# Patient Record
Sex: Male | Born: 1935 | Race: Black or African American | Hispanic: No | Marital: Married | Smoking: Former smoker
Health system: Southern US, Community
[De-identification: ages and names within clinical notes are randomized; demographics above are authoritative.]

## PROBLEM LIST (undated history)

## (undated) DIAGNOSIS — M1990 Unspecified osteoarthritis, unspecified site: Secondary | ICD-10-CM

## (undated) DIAGNOSIS — C61 Malignant neoplasm of prostate: Secondary | ICD-10-CM

## (undated) DIAGNOSIS — I10 Essential (primary) hypertension: Secondary | ICD-10-CM

## (undated) DIAGNOSIS — R7303 Prediabetes: Secondary | ICD-10-CM

## (undated) DIAGNOSIS — K219 Gastro-esophageal reflux disease without esophagitis: Secondary | ICD-10-CM

## (undated) DIAGNOSIS — Z87442 Personal history of urinary calculi: Secondary | ICD-10-CM

## (undated) DIAGNOSIS — T884XXA Failed or difficult intubation, initial encounter: Secondary | ICD-10-CM

## (undated) DIAGNOSIS — D563 Thalassemia minor: Secondary | ICD-10-CM

## (undated) DIAGNOSIS — D751 Secondary polycythemia: Secondary | ICD-10-CM

## (undated) HISTORY — PX: KNEE ARTHROSCOPY: SUR90

## (undated) HISTORY — DX: Secondary polycythemia: D75.1

## (undated) HISTORY — PX: COLONOSCOPY: SHX174

## (undated) HISTORY — DX: Essential (primary) hypertension: I10

---

## 2005-07-01 HISTORY — PX: BACK SURGERY: SHX140

## 2007-06-17 ENCOUNTER — Encounter: Admission: RE | Admit: 2007-06-17 | Discharge: 2007-06-17 | Payer: Self-pay | Admitting: Family Medicine

## 2008-02-22 ENCOUNTER — Encounter: Admission: RE | Admit: 2008-02-22 | Discharge: 2008-02-22 | Payer: Self-pay | Admitting: Family Medicine

## 2009-08-07 ENCOUNTER — Encounter: Admission: RE | Admit: 2009-08-07 | Discharge: 2009-08-07 | Payer: Self-pay | Admitting: Orthopedic Surgery

## 2010-04-18 ENCOUNTER — Encounter: Admission: RE | Admit: 2010-04-18 | Discharge: 2010-04-18 | Payer: Self-pay | Admitting: Family Medicine

## 2010-05-01 ENCOUNTER — Inpatient Hospital Stay (HOSPITAL_COMMUNITY): Admission: RE | Admit: 2010-05-01 | Discharge: 2010-05-02 | Payer: Self-pay | Admitting: Neurosurgery

## 2010-05-28 ENCOUNTER — Encounter: Admission: RE | Admit: 2010-05-28 | Discharge: 2010-05-28 | Payer: Self-pay | Admitting: Family Medicine

## 2010-09-12 LAB — SURGICAL PCR SCREEN: Staphylococcus aureus: NEGATIVE

## 2010-09-12 LAB — CBC
MCH: 26.6 pg (ref 26.0–34.0)
Platelets: 147 10*3/uL — ABNORMAL LOW (ref 150–400)
RBC: 6.72 MIL/uL — ABNORMAL HIGH (ref 4.22–5.81)
WBC: 3.7 10*3/uL — ABNORMAL LOW (ref 4.0–10.5)

## 2010-09-12 LAB — BASIC METABOLIC PANEL
CO2: 30 mEq/L (ref 19–32)
Calcium: 9.5 mg/dL (ref 8.4–10.5)
Creatinine, Ser: 1.24 mg/dL (ref 0.4–1.5)
GFR calc Af Amer: >60 mL/min (ref >60)

## 2010-10-15 ENCOUNTER — Other Ambulatory Visit: Payer: Self-pay | Admitting: Oncology

## 2010-10-15 ENCOUNTER — Encounter (HOSPITAL_BASED_OUTPATIENT_CLINIC_OR_DEPARTMENT_OTHER): Payer: Medicare Other | Admitting: Oncology

## 2010-10-15 DIAGNOSIS — D751 Secondary polycythemia: Secondary | ICD-10-CM

## 2010-10-15 DIAGNOSIS — D72819 Decreased white blood cell count, unspecified: Secondary | ICD-10-CM

## 2010-10-15 LAB — CBC WITH DIFFERENTIAL/PLATELET
BASO%: 0.2 % (ref 0.0–2.0)
EOS%: 1.2 % (ref 0.0–7.0)
LYMPH%: 44.7 % (ref 14.0–49.0)
MCH: 25.7 pg — ABNORMAL LOW (ref 27.2–33.4)
MCHC: 33.5 g/dL (ref 32.0–36.0)
MONO#: 0.5 10*3/uL (ref 0.1–0.9)
MONO%: 11 % (ref 0.0–14.0)
Platelets: 157 10*3/uL (ref 140–400)
RBC: 6.85 10*6/uL — ABNORMAL HIGH (ref 4.20–5.82)
WBC: 4.3 10*3/uL (ref 4.0–10.3)
nRBC: 0 % (ref 0–0)

## 2010-10-15 LAB — MORPHOLOGY: PLT EST: ADEQUATE

## 2010-10-16 LAB — COMPREHENSIVE METABOLIC PANEL
Albumin: 4.6 g/dL (ref 3.5–5.2)
BUN: 14 mg/dL (ref 6–23)
CO2: 27 mEq/L (ref 19–32)
Calcium: 9.6 mg/dL (ref 8.4–10.5)
Chloride: 105 mEq/L (ref 96–112)
Creatinine, Ser: 1.25 mg/dL (ref 0.40–1.50)
Glucose, Bld: 102 mg/dL — ABNORMAL HIGH (ref 70–99)

## 2010-10-16 LAB — IRON AND TIBC
Iron: 133 ug/dL (ref 42–165)
TIBC: 229 ug/dL (ref 215–435)
UIBC: 96 ug/dL

## 2010-10-16 LAB — FERRITIN: Ferritin: 610 ng/mL — ABNORMAL HIGH (ref 22–322)

## 2011-01-07 ENCOUNTER — Encounter (HOSPITAL_BASED_OUTPATIENT_CLINIC_OR_DEPARTMENT_OTHER): Payer: Medicare Other | Admitting: Oncology

## 2011-01-07 ENCOUNTER — Other Ambulatory Visit: Payer: Self-pay | Admitting: Oncology

## 2011-01-07 DIAGNOSIS — D751 Secondary polycythemia: Secondary | ICD-10-CM

## 2011-01-07 DIAGNOSIS — D72819 Decreased white blood cell count, unspecified: Secondary | ICD-10-CM

## 2011-01-07 LAB — CBC WITH DIFFERENTIAL/PLATELET
BASO%: 0.4 % (ref 0.0–2.0)
Basophils Absolute: 0 10*3/uL (ref 0.0–0.1)
EOS%: 0.6 % (ref 0.0–7.0)
HCT: 49 % (ref 38.4–49.9)
HGB: 16.3 g/dL (ref 13.0–17.1)
LYMPH%: 27.1 % (ref 14.0–49.0)
MCH: 26.5 pg — ABNORMAL LOW (ref 27.2–33.4)
MCHC: 33.3 g/dL (ref 32.0–36.0)
MONO#: 0.4 10*3/uL (ref 0.1–0.9)
NEUT%: 63.9 % (ref 39.0–75.0)
Platelets: 156 10*3/uL (ref 140–400)

## 2011-04-24 ENCOUNTER — Other Ambulatory Visit: Payer: Self-pay | Admitting: Oncology

## 2011-04-24 ENCOUNTER — Ambulatory Visit (HOSPITAL_BASED_OUTPATIENT_CLINIC_OR_DEPARTMENT_OTHER): Payer: Medicare Other | Admitting: Oncology

## 2011-04-24 DIAGNOSIS — I10 Essential (primary) hypertension: Secondary | ICD-10-CM

## 2011-04-24 DIAGNOSIS — D751 Secondary polycythemia: Secondary | ICD-10-CM

## 2011-04-24 DIAGNOSIS — D45 Polycythemia vera: Secondary | ICD-10-CM

## 2011-04-24 DIAGNOSIS — D72819 Decreased white blood cell count, unspecified: Secondary | ICD-10-CM

## 2011-04-24 LAB — CBC WITH DIFFERENTIAL/PLATELET
Basophils Absolute: 0 10*3/uL (ref 0.0–0.1)
EOS%: 0.3 % (ref 0.0–7.0)
Eosinophils Absolute: 0 10*3/uL (ref 0.0–0.5)
HGB: 17.3 g/dL — ABNORMAL HIGH (ref 13.0–17.1)
LYMPH%: 26.4 % (ref 14.0–49.0)
MCH: 25.9 pg — ABNORMAL LOW (ref 27.2–33.4)
MCV: 76.6 fL — ABNORMAL LOW (ref 79.3–98.0)
MONO%: 10.4 % (ref 0.0–14.0)
NEUT#: 2.4 10*3/uL (ref 1.5–6.5)
Platelets: 174 10*3/uL (ref 140–400)
RDW: 14.7 % — ABNORMAL HIGH (ref 11.0–14.6)

## 2011-04-24 LAB — COMPREHENSIVE METABOLIC PANEL
AST: 20 U/L (ref 0–37)
Alkaline Phosphatase: 67 U/L (ref 39–117)
BUN: 15 mg/dL (ref 6–23)
Creatinine, Ser: 1.09 mg/dL (ref 0.50–1.35)
Glucose, Bld: 115 mg/dL — ABNORMAL HIGH (ref 70–99)
Potassium: 3.9 mEq/L (ref 3.5–5.3)
Total Bilirubin: 1.6 mg/dL — ABNORMAL HIGH (ref 0.3–1.2)

## 2011-07-24 ENCOUNTER — Other Ambulatory Visit (HOSPITAL_BASED_OUTPATIENT_CLINIC_OR_DEPARTMENT_OTHER): Payer: Medicare Other | Admitting: Lab

## 2011-07-24 DIAGNOSIS — D751 Secondary polycythemia: Secondary | ICD-10-CM

## 2011-07-24 DIAGNOSIS — D72819 Decreased white blood cell count, unspecified: Secondary | ICD-10-CM | POA: Diagnosis not present

## 2011-07-24 LAB — CBC WITH DIFFERENTIAL/PLATELET
BASO%: 0.2 % (ref 0.0–2.0)
Basophils Absolute: 0 10*3/uL (ref 0.0–0.1)
Eosinophils Absolute: 0 10*3/uL (ref 0.0–0.5)
HCT: 54.2 % — ABNORMAL HIGH (ref 38.4–49.9)
HGB: 17.8 g/dL — ABNORMAL HIGH (ref 13.0–17.1)
MONO#: 0.4 10*3/uL (ref 0.1–0.9)
NEUT#: 2.5 10*3/uL (ref 1.5–6.5)
NEUT%: 57.3 % (ref 39.0–75.0)
WBC: 4.3 10*3/uL (ref 4.0–10.3)
lymph#: 1.4 10*3/uL (ref 0.9–3.3)

## 2011-10-10 DIAGNOSIS — N529 Male erectile dysfunction, unspecified: Secondary | ICD-10-CM | POA: Diagnosis not present

## 2011-10-10 DIAGNOSIS — I10 Essential (primary) hypertension: Secondary | ICD-10-CM | POA: Diagnosis not present

## 2011-10-10 DIAGNOSIS — E7800 Pure hypercholesterolemia, unspecified: Secondary | ICD-10-CM | POA: Diagnosis not present

## 2011-10-23 ENCOUNTER — Telehealth: Payer: Self-pay | Admitting: *Deleted

## 2011-10-23 ENCOUNTER — Other Ambulatory Visit (HOSPITAL_BASED_OUTPATIENT_CLINIC_OR_DEPARTMENT_OTHER): Payer: Medicare Other | Admitting: Lab

## 2011-10-23 DIAGNOSIS — D751 Secondary polycythemia: Secondary | ICD-10-CM | POA: Diagnosis not present

## 2011-10-23 DIAGNOSIS — D72819 Decreased white blood cell count, unspecified: Secondary | ICD-10-CM | POA: Diagnosis not present

## 2011-10-23 LAB — CBC WITH DIFFERENTIAL/PLATELET
Basophils Absolute: 0 10*3/uL (ref 0.0–0.1)
EOS%: 0.3 % (ref 0.0–7.0)
HCT: 52.2 % — ABNORMAL HIGH (ref 38.4–49.9)
HGB: 17.1 g/dL (ref 13.0–17.1)
MCH: 26.2 pg — ABNORMAL LOW (ref 27.2–33.4)
MCV: 80.1 fL (ref 79.3–98.0)
NEUT%: 59.9 % (ref 39.0–75.0)
Platelets: 152 10*3/uL (ref 140–400)
lymph#: 1.2 10*3/uL (ref 0.9–3.3)

## 2011-10-23 LAB — COMPREHENSIVE METABOLIC PANEL
Albumin: 4.5 g/dL (ref 3.5–5.2)
CO2: 28 mEq/L (ref 19–32)
Glucose, Bld: 116 mg/dL — ABNORMAL HIGH (ref 70–99)
Sodium: 140 mEq/L (ref 135–145)
Total Bilirubin: 1.6 mg/dL — ABNORMAL HIGH (ref 0.3–1.2)
Total Protein: 6.8 g/dL (ref 6.0–8.3)

## 2011-10-23 NOTE — Telephone Encounter (Signed)
Message copied by Wende Mott on Wed Oct 23, 2011  3:24 PM ------      Message from: HA, Raliegh Ip T      Created: Wed Oct 23, 2011 11:31 AM       Please call pt.  His Hgb is now normalized.  It was high due to diuretic use.  Continue observation.  No change.

## 2011-10-23 NOTE — Telephone Encounter (Signed)
Left VM requesting pt call back for lab results.

## 2011-10-24 ENCOUNTER — Telehealth: Payer: Self-pay | Admitting: *Deleted

## 2011-10-24 NOTE — Telephone Encounter (Signed)
Pt returned call about his lab results.  Informed him Hgb is now normal and per Dr. Gaylyn Rong was probably elevated due to diuretic use.  Continue observation and keep next appt in July as scheduled.  Pt verbalized understanding.

## 2012-01-10 ENCOUNTER — Telehealth: Payer: Self-pay | Admitting: *Deleted

## 2012-01-10 NOTE — Telephone Encounter (Signed)
patient called and requested to move the appointment to 01-27-2012

## 2012-01-22 ENCOUNTER — Ambulatory Visit: Payer: Medicare Other | Admitting: Oncology

## 2012-01-22 ENCOUNTER — Other Ambulatory Visit: Payer: Medicare Other | Admitting: Lab

## 2012-01-27 ENCOUNTER — Other Ambulatory Visit (HOSPITAL_BASED_OUTPATIENT_CLINIC_OR_DEPARTMENT_OTHER): Payer: Medicare Other | Admitting: Lab

## 2012-01-27 ENCOUNTER — Telehealth: Payer: Self-pay | Admitting: Oncology

## 2012-01-27 ENCOUNTER — Ambulatory Visit (HOSPITAL_BASED_OUTPATIENT_CLINIC_OR_DEPARTMENT_OTHER): Payer: Medicare Other | Admitting: Oncology

## 2012-01-27 ENCOUNTER — Encounter: Payer: Self-pay | Admitting: Oncology

## 2012-01-27 VITALS — BP 125/73 | HR 71 | Temp 98.0°F | Wt 190.4 lb

## 2012-01-27 DIAGNOSIS — D72819 Decreased white blood cell count, unspecified: Secondary | ICD-10-CM

## 2012-01-27 DIAGNOSIS — D751 Secondary polycythemia: Secondary | ICD-10-CM

## 2012-01-27 DIAGNOSIS — I10 Essential (primary) hypertension: Secondary | ICD-10-CM

## 2012-01-27 HISTORY — DX: Essential (primary) hypertension: I10

## 2012-01-27 LAB — COMPREHENSIVE METABOLIC PANEL
Albumin: 4.3 g/dL (ref 3.5–5.2)
CO2: 28 mEq/L (ref 19–32)
Glucose, Bld: 107 mg/dL — ABNORMAL HIGH (ref 70–99)
Potassium: 3.9 mEq/L (ref 3.5–5.3)
Sodium: 140 mEq/L (ref 135–145)
Total Protein: 6.6 g/dL (ref 6.0–8.3)

## 2012-01-27 LAB — CBC WITH DIFFERENTIAL/PLATELET
Eosinophils Absolute: 0 10*3/uL (ref 0.0–0.5)
MONO#: 0.5 10*3/uL (ref 0.1–0.9)
NEUT#: 1.5 10*3/uL (ref 1.5–6.5)
RBC: 6.68 10*6/uL — ABNORMAL HIGH (ref 4.20–5.82)
RDW: 15.1 % — ABNORMAL HIGH (ref 11.0–14.6)
WBC: 3.7 10*3/uL — ABNORMAL LOW (ref 4.0–10.3)
lymph#: 1.6 10*3/uL (ref 0.9–3.3)

## 2012-01-27 NOTE — Patient Instructions (Addendum)
1.  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  In the past, you were on diuretic for blood pressure which can increase your Hgb.  Now, you're off of diuretic; but your Hgb is still slightly elevated.  In the past, your JAK-2 mutation testing was negative making polycythemia vera (PV) less likely (but not impossible).    Your white blood cell is slightly decreasing.  Will keep a watch on this.  In the future, if the hemoglobin continues to increase, and your white blood cell continues to decrease, may consider a bone marrow biopsy to ensure that your bone marrow is normal. At this time a bone marrow biopsy may still be too premature. 2.  Followup: Lab tests in 3 months and return visit in about 6 months.

## 2012-01-27 NOTE — Telephone Encounter (Signed)
appts made and printed for pt aom °

## 2012-01-27 NOTE — Progress Notes (Signed)
[facility]  Telephone:(336) [phone] Fax:(336) [phone]   OFFICE PROGRESS NOTE   Cc:  Astrid Divine, MD  DIAGNOSIS: secondary polycythemia; unclear etiology; JAK-2 mutation negative.   CURRENT THERAPY:  Watchful observation.   INTERVAL HISTORY: Cory [name] 76 y.o. male returns for regular follow up by himself.  He reports feeling well.  He recently returned from a 12-day road trip to [place] and back with his wife.  He is planning to go to a family reunion in [place]. [place] next month.  He feels well.  He denied fatigue, anorexia, weight loss, erythromelalgia, skin rash.   Patient denies fever, anorexia, weight loss, fatigue, headache, visual changes, confusion, drenching night sweats, palpable lymph node swelling, mucositis, odynophagia, dysphagia, nausea vomiting, jaundice, chest pain, palpitation, shortness of breath, dyspnea on exertion, productive cough, gum bleeding, epistaxis, hematemesis, hemoptysis, abdominal pain, abdominal swelling, early satiety, melena, hematochezia, hematuria, skin rash, spontaneous bleeding, joint swelling, joint pain, heat or cold intolerance, bowel bladder incontinence, back pain, focal motor weakness, paresthesia, depression, suicidal or homocidal ideation, feeling hopelessness.   Past Medical History  Diagnosis Date  . Leukopenia 01/27/2012  . Polycythemia 01/27/2012  . Hypertension 01/27/2012    No past surgical history on file.  Current Outpatient Prescriptions  Medication Sig Dispense Refill  . aspirin 81 MG tablet Take 81 mg by mouth daily.      . Atorvastatin Calcium (LIPITOR PO) Take by mouth daily.      . Benazepril HCl (LOTENSIN PO) Take by mouth daily.      . Ezetimibe (ZETIA PO) Take by mouth daily.        ALLERGIES:   has no known allergies.  REVIEW OF SYSTEMS:  The rest of the 14-point review of system was negative.   Filed Vitals:   01/27/12 0935  BP: 125/73  Pulse: 71  Temp: 98 F (36.7 C)   Wt  Readings from Last 3 Encounters:  01/27/12 190 lb 6.4 oz (86.365 kg)   ECOG Performance status: 0  PHYSICAL EXAMINATION:   General:  well-nourished man, in no acute distress.  Eyes:  no scleral icterus.  ENT:  There were no oropharyngeal lesions.  Neck was without thyromegaly.  Lymphatics:  Negative cervical, supraclavicular or axillary adenopathy.  Respiratory: lungs were clear bilaterally without wheezing or crackles.  Cardiovascular:  Regular rate and rhythm, S1/S2, without murmur, rub or gallop.  There was no pedal edema.  GI:  abdomen was soft, flat, nontender, nondistended, without organomegaly.  Muscoloskeletal:  no spinal tenderness of palpation of vertebral spine.  Skin exam was without echymosis, petichae.  Neuro exam was nonfocal.  Patient was able to get on and off exam table without assistance.  Gait was normal.  Patient was alerted and oriented.  Attention was good.   Language was appropriate.  Mood was normal without depression.  Speech was not pressured.  Thought content was not tangential.    LABORATORY/RADIOLOGY DATA:  Lab Results  Component Value Date   WBC 3.7* 01/27/2012   HGB 17.4* 01/27/2012   HCT 53.4* 01/27/2012   PLT 142 01/27/2012   GLUCOSE 116* 10/23/2011   ALKPHOS 67 10/23/2011   ALT 30 10/23/2011   AST 23 10/23/2011   NA 140 10/23/2011   K 3.7 10/23/2011   CL 106 10/23/2011   CREATININE 0.99 10/23/2011   BUN 16 10/23/2011   CO2 28 10/23/2011    ASSESSMENT AND PLAN:   1.  Diagnosis:  Secondary polycythemia (elevated hemoglobin Hgb) from unclear  cause.  In the past, he was on diuretic for blood pressure which can increase Hgb.  Now, he is off of diuretic; but his Hgb is still slightly elevated.  In the past, hisJAK-2 mutation testing was negative making polycythemia vera (PV) less likely (but not impossible).    He also has slight leukopenia without neutropenia, which could be a normal variation.  I recommend to continue watchful observation.  In the future, if he  develops significantly worsened polycythemia and leukopenia, I may consider a bone marrow biopsy.  At this time a bone marrow biopsy may still be too premature.  Cory [name] expressed informed understanding and wished to proceed with stated plan.  2.  Followup: Lab tests in 3 months and return visit in about 6 months     The length of time of the face-to-face encounter was 10 minutes. More than 50% of time was spent counseling and coordination of care.

## 2012-03-08 NOTE — Progress Notes (Signed)
Not applicable.  Before EPIC gone live.  

## 2012-04-14 DIAGNOSIS — Z125 Encounter for screening for malignant neoplasm of prostate: Secondary | ICD-10-CM | POA: Diagnosis not present

## 2012-04-14 DIAGNOSIS — M1990 Unspecified osteoarthritis, unspecified site: Secondary | ICD-10-CM | POA: Diagnosis not present

## 2012-04-14 DIAGNOSIS — Z0000 Encounter for general adult medical examination without abnormal findings: Secondary | ICD-10-CM | POA: Diagnosis not present

## 2012-04-14 DIAGNOSIS — E7800 Pure hypercholesterolemia, unspecified: Secondary | ICD-10-CM | POA: Diagnosis not present

## 2012-04-14 DIAGNOSIS — D751 Secondary polycythemia: Secondary | ICD-10-CM | POA: Diagnosis not present

## 2012-04-14 DIAGNOSIS — Z23 Encounter for immunization: Secondary | ICD-10-CM | POA: Diagnosis not present

## 2012-04-14 DIAGNOSIS — G459 Transient cerebral ischemic attack, unspecified: Secondary | ICD-10-CM | POA: Diagnosis not present

## 2012-04-14 DIAGNOSIS — R972 Elevated prostate specific antigen [PSA]: Secondary | ICD-10-CM | POA: Diagnosis not present

## 2012-04-14 DIAGNOSIS — D696 Thrombocytopenia, unspecified: Secondary | ICD-10-CM | POA: Diagnosis not present

## 2012-04-28 ENCOUNTER — Telehealth: Payer: Self-pay | Admitting: *Deleted

## 2012-04-28 ENCOUNTER — Other Ambulatory Visit (HOSPITAL_BASED_OUTPATIENT_CLINIC_OR_DEPARTMENT_OTHER): Payer: Medicare Other | Admitting: Lab

## 2012-04-28 DIAGNOSIS — D751 Secondary polycythemia: Secondary | ICD-10-CM

## 2012-04-28 DIAGNOSIS — D45 Polycythemia vera: Secondary | ICD-10-CM | POA: Diagnosis not present

## 2012-04-28 DIAGNOSIS — D72819 Decreased white blood cell count, unspecified: Secondary | ICD-10-CM | POA: Diagnosis not present

## 2012-04-28 DIAGNOSIS — I10 Essential (primary) hypertension: Secondary | ICD-10-CM

## 2012-04-28 LAB — CBC WITH DIFFERENTIAL/PLATELET
BASO%: 0.5 % (ref 0.0–2.0)
EOS%: 0.6 % (ref 0.0–7.0)
HGB: 17.7 g/dL — ABNORMAL HIGH (ref 13.0–17.1)
MCH: 26.1 pg — ABNORMAL LOW (ref 27.2–33.4)
MCHC: 32.8 g/dL (ref 32.0–36.0)
RDW: 14.9 % — ABNORMAL HIGH (ref 11.0–14.6)
lymph#: 1.5 10*3/uL (ref 0.9–3.3)

## 2012-04-28 LAB — MORPHOLOGY
PLT EST: ADEQUATE
RBC Comments: NORMAL

## 2012-04-28 NOTE — Telephone Encounter (Signed)
Message copied by Wende Mott on Tue Apr 28, 2012 11:53 AM ------      Message from: Jethro Bolus T      Created: Tue Apr 28, 2012 10:48 AM       Please call pt.  His Hgb is still elevated.  Cause is still unclear.  (FYI:  His testing in the past for polycythemia vera was negative).  I recommend continue watchful observation for now.  He has f/u with Belenda Cruise in Jan 2014.  Thanks.

## 2012-04-28 NOTE — Telephone Encounter (Signed)
Called pt w/ lab results and Dr. Lodema Pilot message below. Instructed to keep appt w/ [place] in January as scheduled. He verbalized understanding.

## 2012-07-02 ENCOUNTER — Telehealth: Payer: Self-pay | Admitting: Oncology

## 2012-07-02 NOTE — Telephone Encounter (Signed)
pt called and needed to change appt.Marland KitchenMarland KitchenMarland KitchenAnn Maki

## 2012-07-28 ENCOUNTER — Other Ambulatory Visit: Payer: Medicare Other | Admitting: Lab

## 2012-07-28 ENCOUNTER — Ambulatory Visit: Payer: Medicare Other | Admitting: Oncology

## 2012-08-04 ENCOUNTER — Telehealth: Payer: Self-pay | Admitting: *Deleted

## 2012-08-04 ENCOUNTER — Encounter: Payer: Self-pay | Admitting: Oncology

## 2012-08-04 ENCOUNTER — Other Ambulatory Visit (HOSPITAL_BASED_OUTPATIENT_CLINIC_OR_DEPARTMENT_OTHER): Payer: Medicare Other | Admitting: Lab

## 2012-08-04 ENCOUNTER — Telehealth: Payer: Self-pay | Admitting: Oncology

## 2012-08-04 ENCOUNTER — Ambulatory Visit (HOSPITAL_BASED_OUTPATIENT_CLINIC_OR_DEPARTMENT_OTHER): Payer: Medicare Other | Admitting: Oncology

## 2012-08-04 VITALS — BP 128/89 | HR 73 | Temp 97.8°F | Resp 20 | Ht 68.0 in | Wt 193.8 lb

## 2012-08-04 DIAGNOSIS — D45 Polycythemia vera: Secondary | ICD-10-CM | POA: Diagnosis not present

## 2012-08-04 DIAGNOSIS — I10 Essential (primary) hypertension: Secondary | ICD-10-CM

## 2012-08-04 DIAGNOSIS — D72819 Decreased white blood cell count, unspecified: Secondary | ICD-10-CM

## 2012-08-04 DIAGNOSIS — D751 Secondary polycythemia: Secondary | ICD-10-CM

## 2012-08-04 LAB — COMPREHENSIVE METABOLIC PANEL (CC13)
ALT: 36 U/L (ref 0–55)
BUN: 17.3 mg/dL (ref 7.0–26.0)
CO2: 29 mEq/L (ref 22–29)
Calcium: 9.4 mg/dL (ref 8.4–10.4)
Chloride: 106 mEq/L (ref 98–107)
Creatinine: 1.2 mg/dL (ref 0.7–1.3)

## 2012-08-04 LAB — CBC WITH DIFFERENTIAL/PLATELET
BASO%: 0.4 % (ref 0.0–2.0)
EOS%: 0.7 % (ref 0.0–7.0)
LYMPH%: 33 % (ref 14.0–49.0)
MCHC: 32.8 g/dL (ref 32.0–36.0)
MCV: 79.2 fL — ABNORMAL LOW (ref 79.3–98.0)
MONO%: 11.2 % (ref 0.0–14.0)
Platelets: 146 10*3/uL (ref 140–400)
RBC: 7.05 10*6/uL — ABNORMAL HIGH (ref 4.20–5.82)

## 2012-08-04 LAB — MORPHOLOGY: RBC Comments: NORMAL

## 2012-08-04 NOTE — Telephone Encounter (Signed)
Per Dr. Gaylyn Rong I have moved the appts to later time. JMW

## 2012-08-04 NOTE — Telephone Encounter (Signed)
I have called and set up the bone marrow procedure with the wife. They are requesting a late time that day. Wife transferred to the desk RN. JMW

## 2012-08-04 NOTE — Telephone Encounter (Signed)
sent this email to Dr. Gaylyn Rong This pt called and is requesting that his bm bx be changed to either 2.12.14 after 10:00am or on his spring break of April 14th through the 18th.

## 2012-08-04 NOTE — Progress Notes (Signed)
[facility]  [phone] Fax:(336) [phone]   OFFICE PROGRESS NOTE   Cc:  Astrid Divine, MD  DIAGNOSIS: secondary polycythemia; unclear etiology; JAK-2 mutation negative.   CURRENT THERAPY:  Watchful observation.   INTERVAL HISTORY: Cory [name] 77 y.o. male returns for regular follow up by himself.  He reports feeling well. He is working part-time as a Surveyor, mining. He denied fatigue, anorexia, weight loss, erythromelalgia, skin rash. Denies back pain. No hematuria or other bleeding noted.  Patient denies fever, anorexia, weight loss, fatigue, headache, visual changes, confusion, drenching night sweats, palpable lymph node swelling, mucositis, odynophagia, dysphagia, nausea vomiting, jaundice, chest pain, palpitation, shortness of breath, dyspnea on exertion, productive cough, gum bleeding, epistaxis, hematemesis, hemoptysis, abdominal pain, abdominal swelling, early satiety, melena, hematochezia, hematuria, skin rash, spontaneous bleeding, joint swelling, joint pain, heat or cold intolerance, bowel bladder incontinence, back pain, focal motor weakness, paresthesia, depression, suicidal or homocidal ideation, feeling hopelessness.   Past Medical History  Diagnosis Date  . Leukopenia 01/27/2012  . Polycythemia 01/27/2012  . Hypertension 01/27/2012    History reviewed. No pertinent past surgical history.  Current Outpatient Prescriptions  Medication Sig Dispense Refill  . aspirin 81 MG tablet Take 81 mg by mouth daily.      . Atorvastatin Calcium (LIPITOR PO) Take by mouth daily.      . Benazepril HCl (LOTENSIN PO) Take by mouth daily.      . Ezetimibe (ZETIA PO) Take by mouth daily.        ALLERGIES:   has no known allergies.  REVIEW OF SYSTEMS:  The rest of the 14-point review of system was negative.   Filed Vitals:   08/04/12 1042  BP: 128/89  Pulse: 73  Temp: 97.8 F (36.6 C)  Resp: 20   Wt Readings from Last 3 Encounters:   08/04/12 193 lb 12.8 oz (87.907 kg)  01/27/12 190 lb 6.4 oz (86.365 kg)   ECOG Performance status: 0  PHYSICAL EXAMINATION:   General:  well-nourished man, in no acute distress.  Eyes:  no scleral icterus.  ENT:  There were no oropharyngeal lesions.  Neck was without thyromegaly.  Lymphatics:  Negative cervical, supraclavicular or axillary adenopathy.  Respiratory: lungs were clear bilaterally without wheezing or crackles.  Cardiovascular:  Regular rate and rhythm, S1/S2, without murmur, rub or gallop.  There was no pedal edema.  GI:  abdomen was soft, flat, nontender, nondistended, without organomegaly.  Muscoloskeletal:  no spinal tenderness of palpation of vertebral spine.  Skin exam was without echymosis, petichae.  Neuro exam was nonfocal.  Patient was able to get on and off exam table without assistance.  Gait was normal.  Patient was alerted and oriented.  Attention was good.   Language was appropriate.  Mood was normal without depression.  Speech was not pressured.  Thought content was not tangential.    LABORATORY/RADIOLOGY DATA:  Lab Results  Component Value Date   WBC 4.4 08/04/2012   HGB 18.3* 08/04/2012   HCT 55.8* 08/04/2012   PLT 146 08/04/2012   GLUCOSE 108* 08/04/2012   ALKPHOS 83 08/04/2012   ALT 36 08/04/2012   AST 19 08/04/2012   NA 145 08/04/2012   K 3.9 08/04/2012   CL 106 08/04/2012   CREATININE 1.2 08/04/2012   BUN 17.3 08/04/2012   CO2 29 08/04/2012    ASSESSMENT AND PLAN:   1.  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  In the past, he was  on diuretic for blood pressure which can increase Hgb.  Now, he is off of diuretic; but his Hgb is still slightly elevated and up slightly from his last lab. In the past, hisJAK-2 mutation testing was negative making polycythemia vera (PV) less likely (but not impossible).  Discussed proceeding with a bone marrow biopsy. The bone marrow biopsy procedure was discussed with him in detail. He is in agreement with  proceeding with the bone marrow biopsy.  I have recommended that he continue ASA.  2.  Followup: Will be scheduled after his bone marrow biopsy.     The length of time of the face-to-face encounter was 15 minutes. More than 50% of time was spent counseling and coordination of care.

## 2012-08-12 ENCOUNTER — Other Ambulatory Visit: Payer: Self-pay | Admitting: Oncology

## 2012-08-12 ENCOUNTER — Other Ambulatory Visit (HOSPITAL_COMMUNITY)
Admission: RE | Admit: 2012-08-12 | Discharge: 2012-08-12 | Disposition: A | Discharge status: Home / Self Care | Payer: Medicare Other | Source: Ambulatory Visit | Attending: Oncology | Admitting: Oncology

## 2012-08-12 ENCOUNTER — Ambulatory Visit (HOSPITAL_BASED_OUTPATIENT_CLINIC_OR_DEPARTMENT_OTHER): Payer: Medicare Other | Admitting: Oncology

## 2012-08-12 ENCOUNTER — Telehealth: Payer: Self-pay | Admitting: Oncology

## 2012-08-12 ENCOUNTER — Other Ambulatory Visit: Payer: Medicare Other

## 2012-08-12 VITALS — BP 152/88 | HR 70 | Temp 98.1°F

## 2012-08-12 DIAGNOSIS — D72819 Decreased white blood cell count, unspecified: Secondary | ICD-10-CM

## 2012-08-12 DIAGNOSIS — D751 Secondary polycythemia: Secondary | ICD-10-CM | POA: Diagnosis not present

## 2012-08-12 DIAGNOSIS — D45 Polycythemia vera: Secondary | ICD-10-CM | POA: Insufficient documentation

## 2012-08-12 LAB — BONE MARROW EXAM: Bone Marrow Exam: 114

## 2012-08-12 NOTE — Patient Instructions (Addendum)
Bone Marrow Aspiration, Bone Marrow Biopsy  Care After  Read the instructions outlined below and refer to this sheet in the next few weeks. These discharge instructions provide you with general information on caring for yourself after you leave the hospital. Your caregiver may also give you specific instructions. While your treatment has been planned according to the most current medical practices available, unavoidable complications occasionally occur. If you have any problems or questions after discharge, call your caregiver.  FINDING OUT THE RESULTS OF YOUR TEST  Not all test results are available during your visit. If your test results are not back during the visit, make an appointment with your caregiver to find out the results. Do not assume everything is normal if you have not heard from your caregiver or the medical facility. It is important for you to follow up on all of your test results.   HOME CARE INSTRUCTIONS   You have had sedation and may be sleepy or dizzy. Your thinking may not be as clear as usual. For the next 24 hours:   Only take over-the-counter or prescription medicines for pain, discomfort, and or fever as directed by your caregiver.   Do not drink alcohol.   Do not smoke.   Do not drive.   Do not make important legal decisions.   Do not operate heavy machinery.   Do not care for small children by yourself.   Keep your dressing clean and dry. You may replace dressing with a bandage after 24 hours.   You may take a bath or shower after 24 hours.   Use an ice pack for 20 minutes every 2 hours while awake for pain as needed.  SEEK MEDICAL CARE IF:    There is redness, swelling, or increasing pain at the biopsy site.   There is pus coming from the biopsy site.   There is drainage from a biopsy site lasting longer than one day.   An unexplained oral temperature above 102 F (38.9 C) develops.  SEEK IMMEDIATE MEDICAL CARE IF:    You develop a rash.   You have difficulty  breathing.   You develop any reaction or side effects to medications given.  Document Released: 01/04/2005 Document Revised: 09/09/2011 Document Reviewed: 06/14/2008  ExitCare Patient Information 2013 [employer].

## 2012-08-12 NOTE — Procedures (Signed)
   [facility]  Telephone:(336) [phone] Fax:(336) [phone]   BONE MARROW BIOPSY AND ASPIRATION   INDICATION:  Persistent JAK-2 negative polycythemia  Procedure: After obtained from consent, Cory [name] was placed in the prone position. Time out was performed verifying correct patient and procedure. The skin overlying the left posterior crest was prepped with Betadine and draped in the usual sterile fashion. The skin and periosteum were infiltrated with 20 mL of 2% lidocaine. A small puncture wound was made with #11 scalpel blade.  Bone marrow aspirate was obtained on the first pass of the aspiration needle.  One core biopsy was obtained through the same incision.   The aspirate was sent for routine histology, flow cytometry, and cytogenetics.  Core biopsy was sent for routine histology.   Cory [name] tolerated procedure well with minimal  blood loss and without immediate complication.   A sterile dressing was applied.   Jethro Bolus M.D. 08/12/2012

## 2012-08-12 NOTE — Progress Notes (Signed)
Please see bone marrow biopsy dated 08/12/2012.

## 2012-08-12 NOTE — Progress Notes (Signed)
1140-Pt discharged to home s/p BM biopsy to left hip per Dr. Gaylyn Rong.  VS stable.  Dressing clean, dry and intact to left hip at time of discharge. BM biopsy discharge instructions given to patient and pt.'s wife per Dr. Gaylyn Rong and reviewed at time of discharge.  Teach back done.  Pt and pt.'s wife with no questions at this time.

## 2012-08-12 NOTE — Telephone Encounter (Signed)
gv and printed appt schedule for pt for March °

## 2012-08-13 LAB — CBC
HCT: 56.4 % — ABNORMAL HIGH (ref 39.0–52.0)
MCH: 26.1 pg (ref 26.0–34.0)
MCHC: 30.7 g/dL (ref 30.0–36.0)
RDW: 16.4 % — ABNORMAL HIGH (ref 11.5–15.5)

## 2012-08-13 LAB — DIFFERENTIAL
Basophils Absolute: 0 10*3/uL (ref 0.0–0.1)
Basophils Relative: 0 % (ref 0–1)
Eosinophils Absolute: 0 10*3/uL (ref 0.0–0.7)
Eosinophils Relative: 1 % (ref 0–5)
Monocytes Absolute: 0.4 10*3/uL (ref 0.1–1.0)
Neutro Abs: 1.6 10*3/uL — ABNORMAL LOW (ref 1.7–7.7)

## 2012-09-01 NOTE — Patient Instructions (Addendum)
1. Issue:  Polycythemia (elevated red blood cell) 2.  Bone marrow biopsy in 08/2012 did not show leukemia, lymphoma.  3.  I have high suspicion for JAK-2 negative polycythemia vera (a condition where the bone marrow spontaneously produces red blood cell without feedback signal).  Untreated, it can result in elevated risk of blood clot. 4.  Recommendations:   *  Start phlebotomy monthly to achieve hematocrit to <45%>  *   Once hematocrit is <45%, will start Hydrea to decrease risk of blood clot.    *  Start Asprin 81 mg by mouth, once daily now to decrease risk of blood clot.

## 2012-09-02 ENCOUNTER — Ambulatory Visit (HOSPITAL_BASED_OUTPATIENT_CLINIC_OR_DEPARTMENT_OTHER): Payer: Medicare Other | Admitting: Oncology

## 2012-09-02 ENCOUNTER — Ambulatory Visit (HOSPITAL_BASED_OUTPATIENT_CLINIC_OR_DEPARTMENT_OTHER): Payer: Medicare Other | Admitting: Lab

## 2012-09-02 VITALS — BP 142/89 | HR 87 | Temp 98.5°F | Resp 20 | Ht 68.0 in | Wt 192.8 lb

## 2012-09-02 DIAGNOSIS — D751 Secondary polycythemia: Secondary | ICD-10-CM

## 2012-09-02 DIAGNOSIS — D45 Polycythemia vera: Secondary | ICD-10-CM

## 2012-09-02 LAB — CBC WITH DIFFERENTIAL/PLATELET
BASO%: 0.6 % (ref 0.0–2.0)
EOS%: 0.6 % (ref 0.0–7.0)
HGB: 17.9 g/dL — ABNORMAL HIGH (ref 13.0–17.1)
MCH: 25.9 pg — ABNORMAL LOW (ref 27.2–33.4)
MCHC: 32.9 g/dL (ref 32.0–36.0)
MCV: 78.9 fL — ABNORMAL LOW (ref 79.3–98.0)
MONO%: 12.1 % (ref 0.0–14.0)
RBC: 6.89 10*6/uL — ABNORMAL HIGH (ref 4.20–5.82)
RDW: 15 % — ABNORMAL HIGH (ref 11.0–14.6)
lymph#: 1.5 10*3/uL (ref 0.9–3.3)

## 2012-09-02 NOTE — Progress Notes (Signed)
[facility]  Telephone:(336) [phone] Fax:(336) [phone]   OFFICE PROGRESS NOTE   Cc:  Astrid Divine, MD  DIAGNOSIS: JAK-2 mutation negative polycythemia; cannot rule out PV since extensive work up has been negative for secondary causes of polycythemia including normal bone marrow biopsy in 08/2012.   CURRENT THERAPY:  Watchful observation.   INTERVAL HISTORY: Cory [name] 77 y.o. male returns for regular follow up by himself to discuss result of work up.  He feels great.  He denied all symptoms.    Patient denied fever, anorexia, weight loss, fatigue, headache, visual changes, confusion, drenching night sweats, palpable lymph node swelling, mucositis, odynophagia, dysphagia, nausea vomiting, jaundice, chest pain, palpitation, shortness of breath, dyspnea on exertion, productive cough, gum bleeding, epistaxis, hematemesis, hemoptysis, abdominal pain, abdominal swelling, early satiety, melena, hematochezia, hematuria, skin rash, spontaneous bleeding, joint swelling, joint pain, heat or cold intolerance, bowel bladder incontinence, back pain, focal motor weakness, paresthesia, depression.     Past Medical History  Diagnosis Date  . Leukopenia 01/27/2012  . Polycythemia 01/27/2012  . Hypertension 01/27/2012    No past surgical history on file.  Current Outpatient Prescriptions  Medication Sig Dispense Refill  . aspirin 81 MG tablet Take 81 mg by mouth daily.      . Atorvastatin Calcium (LIPITOR PO) Take by mouth daily.      . Benazepril HCl (LOTENSIN PO) Take by mouth daily.      . Ezetimibe (ZETIA PO) Take by mouth daily.       No current facility-administered medications for this visit.    ALLERGIES:  has No Known Allergies.  REVIEW OF SYSTEMS:  The rest of the 14-point review of system was negative.   Filed Vitals:   09/02/12 0957  BP: 142/89  Pulse: 87  Temp: 98.5 F (36.9 C)  Resp: 20   Wt Readings from Last 3 Encounters:  09/02/12 192 lb  12.8 oz (87.454 kg)  08/04/12 193 lb 12.8 oz (87.907 kg)  01/27/12 190 lb 6.4 oz (86.365 kg)   ECOG Performance status: 0  PHYSICAL EXAMINATION:   General:  well-nourished man, in no acute distress.  Eyes:  no scleral icterus.  ENT:  There were no oropharyngeal lesions.  Neck was without thyromegaly.  Lymphatics:  Negative cervical, supraclavicular or axillary adenopathy.  Respiratory: lungs were clear bilaterally without wheezing or crackles.  Cardiovascular:  Regular rate and rhythm, S1/S2, without murmur, rub or gallop.  There was no pedal edema.  GI:  abdomen was soft, flat, nontender, nondistended, without organomegaly.  Muscoloskeletal:  no spinal tenderness of palpation of vertebral spine.  Skin exam was without echymosis, petichae.  Neuro exam was nonfocal.  Patient was able to get on and off exam table without assistance.  Gait was normal.  Patient was alerted and oriented.  Attention was good.   Language was appropriate.  Mood was normal without depression.  Speech was not pressured.  Thought content was not tangential.    LABORATORY/RADIOLOGY DATA:  Lab Results  Component Value Date   WBC 4.2 09/02/2012   HGB 17.9* 09/02/2012   HCT 54.4* 09/02/2012   PLT 152 09/02/2012   GLUCOSE 108* 08/04/2012   ALKPHOS 83 08/04/2012   ALT 36 08/04/2012   AST 19 08/04/2012   NA 145 08/04/2012   K 3.9 08/04/2012   CL 106 08/04/2012   CREATININE 1.2 08/04/2012   BUN 17.3 08/04/2012   CO2 29 08/04/2012    ASSESSMENT AND PLAN:   1. Issue:  Polycythemia (elevated red blood cell) 2.  Bone marrow biopsy in 08/2012 did not show leukemia, lymphoma.  3.  Causes of secondary polycythemia have been ruled out.  I still have high suspicion for JAK-2 negative polycythemia vera (a condition where the bone marrow spontaneously produces red blood cell without feedback signal).  Untreated, it can result in elevated risk of blood clot. 4.  Recommendations:   *  Start phlebotomy monthly to achieve hematocrit to <45%.   *   Once  hematocrit is <45%, will start Hydrea to decrease risk of blood clot.    *  Continue Asprin 81 mg by mouth, once daily now to decrease risk of blood clot.   He expressed informed understanding and agreed with stated recommendations.    The length of time of the face-to-face encounter was 15 minutes. More than 50% of time was spent counseling and coordination of care.

## 2012-09-21 DIAGNOSIS — H903 Sensorineural hearing loss, bilateral: Secondary | ICD-10-CM | POA: Diagnosis not present

## 2012-09-28 ENCOUNTER — Telehealth: Payer: Self-pay | Admitting: Oncology

## 2012-09-30 ENCOUNTER — Other Ambulatory Visit: Payer: Medicare Other

## 2012-09-30 ENCOUNTER — Other Ambulatory Visit (HOSPITAL_BASED_OUTPATIENT_CLINIC_OR_DEPARTMENT_OTHER): Payer: Medicare Other | Admitting: Lab

## 2012-09-30 DIAGNOSIS — D45 Polycythemia vera: Secondary | ICD-10-CM

## 2012-09-30 DIAGNOSIS — D751 Secondary polycythemia: Secondary | ICD-10-CM

## 2012-09-30 LAB — CBC WITH DIFFERENTIAL/PLATELET
Basophils Absolute: 0 10*3/uL (ref 0.0–0.1)
Eosinophils Absolute: 0 10*3/uL (ref 0.0–0.5)
HGB: 16.8 g/dL (ref 13.0–17.1)
MONO%: 18.3 % — ABNORMAL HIGH (ref 0.0–14.0)
NEUT#: 1.5 10*3/uL (ref 1.5–6.5)
RBC: 6.57 10*6/uL — ABNORMAL HIGH (ref 4.20–5.82)
RDW: 15 % — ABNORMAL HIGH (ref 11.0–14.6)
WBC: 3.4 10*3/uL — ABNORMAL LOW (ref 4.0–10.3)
lymph#: 1.2 10*3/uL (ref 0.9–3.3)

## 2012-10-13 DIAGNOSIS — G459 Transient cerebral ischemic attack, unspecified: Secondary | ICD-10-CM | POA: Diagnosis not present

## 2012-10-13 DIAGNOSIS — I10 Essential (primary) hypertension: Secondary | ICD-10-CM | POA: Diagnosis not present

## 2012-10-13 DIAGNOSIS — E7800 Pure hypercholesterolemia, unspecified: Secondary | ICD-10-CM | POA: Diagnosis not present

## 2012-10-13 DIAGNOSIS — D751 Secondary polycythemia: Secondary | ICD-10-CM | POA: Diagnosis not present

## 2012-10-24 DIAGNOSIS — H25019 Cortical age-related cataract, unspecified eye: Secondary | ICD-10-CM | POA: Diagnosis not present

## 2012-11-03 ENCOUNTER — Telehealth: Payer: Self-pay | Admitting: Oncology

## 2012-11-04 ENCOUNTER — Other Ambulatory Visit: Payer: Medicare Other

## 2012-11-05 ENCOUNTER — Other Ambulatory Visit (HOSPITAL_BASED_OUTPATIENT_CLINIC_OR_DEPARTMENT_OTHER): Payer: Medicare Other

## 2012-11-05 DIAGNOSIS — D45 Polycythemia vera: Secondary | ICD-10-CM | POA: Diagnosis not present

## 2012-11-05 DIAGNOSIS — D751 Secondary polycythemia: Secondary | ICD-10-CM

## 2012-11-05 LAB — CBC WITH DIFFERENTIAL/PLATELET
Basophils Absolute: 0 10*3/uL (ref 0.0–0.1)
Eosinophils Absolute: 0 10*3/uL (ref 0.0–0.5)
HGB: 16.7 g/dL (ref 13.0–17.1)
LYMPH%: 38.8 % (ref 14.0–49.0)
MCV: 78.8 fL — ABNORMAL LOW (ref 79.3–98.0)
MONO#: 0.5 10*3/uL (ref 0.1–0.9)
MONO%: 11.6 % (ref 0.0–14.0)
NEUT#: 1.9 10*3/uL (ref 1.5–6.5)
Platelets: 150 10*3/uL (ref 140–400)
RBC: 6.49 10*6/uL — ABNORMAL HIGH (ref 4.20–5.82)
RDW: 14.8 % — ABNORMAL HIGH (ref 11.0–14.6)
WBC: 4 10*3/uL (ref 4.0–10.3)

## 2012-11-13 ENCOUNTER — Other Ambulatory Visit: Payer: Self-pay | Admitting: Oncology

## 2012-11-13 ENCOUNTER — Telehealth: Payer: Self-pay | Admitting: Oncology

## 2012-11-13 ENCOUNTER — Encounter: Payer: Self-pay | Admitting: Oncology

## 2012-11-13 DIAGNOSIS — D751 Secondary polycythemia: Secondary | ICD-10-CM

## 2012-11-16 ENCOUNTER — Telehealth: Payer: Self-pay | Admitting: *Deleted

## 2012-11-16 NOTE — Telephone Encounter (Signed)
Per staff message and POF I have scheduled appts.  JMW  

## 2012-11-18 ENCOUNTER — Telehealth: Payer: Self-pay | Admitting: Oncology

## 2012-11-18 ENCOUNTER — Telehealth: Payer: Self-pay | Admitting: *Deleted

## 2012-11-18 NOTE — Telephone Encounter (Signed)
Pt called regarding letter he received about needing phlebotomy appts.  Informed him of date/times of lab/phlebotomy and reviewed procedure for phlebotomy.  Pt verbalized understanding and asks if appt on 7/02 can be changed to 7/01 as he is going out of town on 7/02.   POF sent to scheduling to r/s that appt.  He verbalized understanding.

## 2012-11-26 ENCOUNTER — Telehealth: Payer: Self-pay | Admitting: Oncology

## 2012-12-02 ENCOUNTER — Ambulatory Visit (HOSPITAL_BASED_OUTPATIENT_CLINIC_OR_DEPARTMENT_OTHER): Payer: Medicare Other

## 2012-12-02 ENCOUNTER — Other Ambulatory Visit (HOSPITAL_BASED_OUTPATIENT_CLINIC_OR_DEPARTMENT_OTHER): Payer: Medicare Other

## 2012-12-02 VITALS — BP 130/75 | HR 69 | Temp 98.2°F

## 2012-12-02 DIAGNOSIS — D751 Secondary polycythemia: Secondary | ICD-10-CM

## 2012-12-02 DIAGNOSIS — D45 Polycythemia vera: Secondary | ICD-10-CM

## 2012-12-02 LAB — CBC WITH DIFFERENTIAL/PLATELET
Basophils Absolute: 0 10*3/uL (ref 0.0–0.1)
Eosinophils Absolute: 0 10*3/uL (ref 0.0–0.5)
HCT: 51.7 % — ABNORMAL HIGH (ref 38.4–49.9)
LYMPH%: 37.5 % (ref 14.0–49.0)
MCV: 78.7 fL — ABNORMAL LOW (ref 79.3–98.0)
MONO#: 0.5 10*3/uL (ref 0.1–0.9)
MONO%: 11.8 % (ref 0.0–14.0)
NEUT#: 2 10*3/uL (ref 1.5–6.5)
NEUT%: 49.5 % (ref 39.0–75.0)
Platelets: 147 10*3/uL (ref 140–400)
WBC: 4.1 10*3/uL (ref 4.0–10.3)

## 2012-12-02 NOTE — Patient Instructions (Addendum)

## 2012-12-29 ENCOUNTER — Other Ambulatory Visit (HOSPITAL_BASED_OUTPATIENT_CLINIC_OR_DEPARTMENT_OTHER): Payer: Medicare Other | Admitting: Lab

## 2012-12-29 ENCOUNTER — Ambulatory Visit (HOSPITAL_BASED_OUTPATIENT_CLINIC_OR_DEPARTMENT_OTHER): Payer: Medicare Other

## 2012-12-29 DIAGNOSIS — D45 Polycythemia vera: Secondary | ICD-10-CM

## 2012-12-29 DIAGNOSIS — D751 Secondary polycythemia: Secondary | ICD-10-CM

## 2012-12-29 LAB — CBC WITH DIFFERENTIAL/PLATELET
BASO%: 0.7 % (ref 0.0–2.0)
EOS%: 0.6 % (ref 0.0–7.0)
HCT: 49.1 % (ref 38.4–49.9)
LYMPH%: 36.9 % (ref 14.0–49.0)
MCH: 25.9 pg — ABNORMAL LOW (ref 27.2–33.4)
MCHC: 33.4 g/dL (ref 32.0–36.0)
MCV: 77.6 fL — ABNORMAL LOW (ref 79.3–98.0)
MONO#: 0.5 10*3/uL (ref 0.1–0.9)
MONO%: 12.4 % (ref 0.0–14.0)
NEUT%: 49.4 % (ref 39.0–75.0)
Platelets: 151 10*3/uL (ref 140–400)
RBC: 6.33 10*6/uL — ABNORMAL HIGH (ref 4.20–5.82)

## 2012-12-29 NOTE — Patient Instructions (Addendum)

## 2012-12-30 ENCOUNTER — Other Ambulatory Visit: Payer: Medicare Other

## 2013-01-23 DIAGNOSIS — H40019 Open angle with borderline findings, low risk, unspecified eye: Secondary | ICD-10-CM | POA: Diagnosis not present

## 2013-02-03 ENCOUNTER — Other Ambulatory Visit: Payer: Self-pay | Admitting: Oncology

## 2013-02-03 ENCOUNTER — Ambulatory Visit (HOSPITAL_BASED_OUTPATIENT_CLINIC_OR_DEPARTMENT_OTHER): Payer: Medicare Other

## 2013-02-03 ENCOUNTER — Other Ambulatory Visit (HOSPITAL_BASED_OUTPATIENT_CLINIC_OR_DEPARTMENT_OTHER): Payer: Medicare Other

## 2013-02-03 DIAGNOSIS — D45 Polycythemia vera: Secondary | ICD-10-CM

## 2013-02-03 DIAGNOSIS — D751 Secondary polycythemia: Secondary | ICD-10-CM

## 2013-02-03 LAB — CBC WITH DIFFERENTIAL/PLATELET
BASO%: 0.4 % (ref 0.0–2.0)
EOS%: 1 % (ref 0.0–7.0)
HCT: 47.6 % (ref 38.4–49.9)
MCH: 25.7 pg — ABNORMAL LOW (ref 27.2–33.4)
MCHC: 32.5 g/dL (ref 32.0–36.0)
NEUT%: 47.3 % (ref 39.0–75.0)
RBC: 6.02 10*6/uL — ABNORMAL HIGH (ref 4.20–5.82)
lymph#: 1.5 10*3/uL (ref 0.9–3.3)

## 2013-02-03 NOTE — Patient Instructions (Addendum)
Therapeutic Phlebotomy Therapeutic phlebotomy is the controlled removal of blood from your body for the purpose of treating a medical condition. It is similar to donating blood. Usually, about a pint (470 mL) of blood is removed. The average adult has 9 to 12 pints (4.3 to 5.7 L) of blood. Therapeutic phlebotomy may be used to treat the following medical conditions:  Hemochromatosis. This is a condition in which there is too much iron in the blood.  Polycythemia vera. This is a condition in which there are too many red cells in the blood.  Porphyria cutanea tarda. This is a disease usually passed from one generation to the next (inherited). It is a condition in which an important part of hemoglobin is not made properly. This results in the build up of abnormal amounts of porphyrins in the body.  Sickle cell disease. This is an inherited disease. It is a condition in which the red blood cells form an abnormal crescent shape rather than a round shape. LET YOUR CAREGIVER KNOW ABOUT:  Allergies.  Medicines taken including herbs, eyedrops, over-the-counter medicines, and creams.  Use of steroids (by mouth or creams).  Previous problems with anesthetics or numbing medicine.  History of blood clots.  History of bleeding or blood problems.  Previous surgery.  Possibility of pregnancy, if this applies. RISKS AND COMPLICATIONS This is a simple and safe procedure. Problems are unlikely. However, problems can occur and may include:  Nausea or lightheadedness.  Low blood pressure.  Soreness, bleeding, swelling, or bruising at the needle insertion site.  Infection. BEFORE THE PROCEDURE  This is a procedure that can be done as an outpatient. Confirm the time that you need to arrive for your procedure. Confirm whether there is a need to fast or withhold any medications. It is helpful to wear clothing with sleeves that can be raised above the elbow. A blood sample may be done to determine the  amount of red blood cells or iron in your blood. Plan ahead of time to have someone drive you home after the procedure. PROCEDURE The entire procedure from preparation through recovery takes about 1 hour. The actual collection takes about 10 to 15 minutes.  A needle will be inserted into your vein.  Tubing and a collection bag will be attached to that needle.  Blood will flow through the needle and tubing into the collection bag.  You may be asked to open and close your hand slowly and continuously during the entire collection.  Once the specified amount of blood has been removed from your body, the collection bag and tubing will be clamped.  The needle will be removed.  Pressure will be held on the site of the needle insertion to stop the bleeding. Then a bandage will be placed over the needle insertion site. AFTER THE PROCEDURE  Your recovery will be assessed and monitored. If there are no problems, as an outpatient, you should be able to go home shortly after the procedure.  Document Released: 11/19/2010 Document Revised: 09/09/2011 Document Reviewed: 11/19/2010 ExitCare Patient Information 2014 [employer].  

## 2013-02-03 NOTE — Progress Notes (Signed)
520 units removed using a 16 gauge phlebotomy set over 5 minutes. Patient tolerated well. Nourishment provided.

## 2013-03-03 ENCOUNTER — Ambulatory Visit (HOSPITAL_BASED_OUTPATIENT_CLINIC_OR_DEPARTMENT_OTHER): Payer: Medicare Other | Admitting: Hematology and Oncology

## 2013-03-03 ENCOUNTER — Telehealth: Payer: Self-pay | Admitting: *Deleted

## 2013-03-03 ENCOUNTER — Telehealth: Payer: Self-pay | Admitting: Hematology and Oncology

## 2013-03-03 ENCOUNTER — Ambulatory Visit (HOSPITAL_BASED_OUTPATIENT_CLINIC_OR_DEPARTMENT_OTHER): Payer: Medicare Other

## 2013-03-03 ENCOUNTER — Other Ambulatory Visit (HOSPITAL_BASED_OUTPATIENT_CLINIC_OR_DEPARTMENT_OTHER): Payer: Medicare Other

## 2013-03-03 VITALS — BP 137/67 | HR 83 | Temp 97.9°F | Resp 18 | Ht 68.0 in | Wt 192.9 lb

## 2013-03-03 DIAGNOSIS — D45 Polycythemia vera: Secondary | ICD-10-CM | POA: Diagnosis not present

## 2013-03-03 DIAGNOSIS — D751 Secondary polycythemia: Secondary | ICD-10-CM

## 2013-03-03 LAB — CBC WITH DIFFERENTIAL/PLATELET
BASO%: 0.9 % (ref 0.0–2.0)
Basophils Absolute: 0 10*3/uL (ref 0.0–0.1)
EOS%: 0.8 % (ref 0.0–7.0)
HGB: 16.2 g/dL (ref 13.0–17.1)
MCH: 25.4 pg — ABNORMAL LOW (ref 27.2–33.4)
MCHC: 32.2 g/dL (ref 32.0–36.0)
MONO#: 0.4 10*3/uL (ref 0.1–0.9)
RDW: 14.5 % (ref 11.0–14.6)
WBC: 4.2 10*3/uL (ref 4.0–10.3)
lymph#: 1.4 10*3/uL (ref 0.9–3.3)

## 2013-03-03 LAB — COMPREHENSIVE METABOLIC PANEL (CC13)
ALT: 27 U/L (ref 0–55)
AST: 20 U/L (ref 5–34)
Albumin: 3.9 g/dL (ref 3.5–5.0)
Calcium: 9.3 mg/dL (ref 8.4–10.4)
Chloride: 107 mEq/L (ref 98–109)
Potassium: 3.6 mEq/L (ref 3.5–5.1)

## 2013-03-03 NOTE — Patient Instructions (Addendum)

## 2013-03-03 NOTE — Progress Notes (Signed)
Phlebotomy performed via left antecubital. 500 cc blood removed without problems. Patient tolerated procedure well. Snack and drink provided.

## 2013-03-03 NOTE — Telephone Encounter (Signed)
gv and printed appt sched and avs for pt for SEpt thru Dec...emailed MW to add tx.

## 2013-03-03 NOTE — Telephone Encounter (Signed)
Per staff message and POF I have scheduled appts.  JMW  

## 2013-03-04 NOTE — Progress Notes (Signed)
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   CURRENT THERAPY: Phlebotomy, aspirin.   INTERVAL HISTORY:  Cory [name] 77 y.o. male who returns for regular follow up visit  He reports feeling well. He had only three phlebotomies done so far. His appetite is good and weight is stable. He had cramps in his legs twice when he was in [place] one week ago. The patient denied fever, chills, night sweats. He denied headaches, double vision, blurry vision, nasal congestion, nasal discharge, hearing problems, odynophagia or dysphagia. No chest pain, palpitations, dyspnea, cough, abdominal pain, nausea, vomiting, diarrhea, constipation, hematochezia. The patient denied dysuria, nocturia, polyuria, hematuria, numbness, tingling, psychiatric problems.  PAST MEDICAL HISTORY: Past Medical History  Diagnosis Date  . Leukopenia 01/27/2012  . Polycythemia 01/27/2012  . Hypertension 01/27/2012   HEALTH MAINTENANCE: History  Substance Use Topics  . Smoking status: Former Games developer  . Smokeless tobacco: Not on file  . Alcohol Use:     No Known Allergies  Current Outpatient Prescriptions  Medication Sig Dispense Refill  . aspirin 81 MG tablet Take 81 mg by mouth daily.      . Atorvastatin Calcium (LIPITOR PO) Take by mouth daily.      . Benazepril HCl (LOTENSIN PO) Take by mouth daily.      . Ezetimibe (ZETIA PO) Take by mouth daily.       No current facility-administered medications for this visit.    OBJECTIVE: Filed Vitals:   03/03/13 1006  BP: 137/67  Pulse: 83  Temp: 97.9 F (36.6 C)  Resp: 18     Body mass index is 29.34 kg/(m^2).    ECOG FS: 0  PHYSICAL EXAMINATION:  HEENT: Sclerae anicteric.  Conjunctivae were pink. Pupils round and reactive  bilaterally. Oral mucosa is moist without ulceration or thrush. No occipital, submandibular, cervical, supraclavicular or axillar adenopathy. Lungs: clear to auscultation without wheezes. No rales or rhonchi. Heart: regular rate and rhythm. No murmur, gallop or rubs. Abdomen: soft, non tender. No guarding or rebound tenderness. Bowel sounds are present. No palpable hepatosplenomegaly. MSK: no focal spinal tenderness. Extremities: No clubbing or cyanosis.No calf tenderness to palpitation, no peripheral edema. The patient had grossly intact strength in upper and lower extremities. Skin exam was without ecchymosis, petechiae. Neuro: non-focal, alert and oriented to time, person and place, appropriate affect  LAB RESULTS:  CMP     Component Value Date/Time   NA 144 03/03/2013 0944   NA 140 01/27/2012 0845   K 3.6 03/03/2013 0944   K 3.9 01/27/2012 0845   CL 106 08/04/2012 0957   CL 105 01/27/2012 0845   CO2 26 03/03/2013 0944   CO2 28 01/27/2012 0845   GLUCOSE 132 03/03/2013 0944   GLUCOSE 108* 08/04/2012 0957   GLUCOSE 107* 01/27/2012 0845   BUN 19.6 03/03/2013 0944   BUN 19 01/27/2012 0845   CREATININE 1.2 03/03/2013 0944   CREATININE 1.17 01/27/2012 0845   CALCIUM 9.3 03/03/2013 0944   CALCIUM 9.4 01/27/2012 0845   PROT 6.9 03/03/2013 0944   PROT 6.6 01/27/2012 0845   ALBUMIN 3.9 03/03/2013 0944   ALBUMIN 4.3 01/27/2012 0845   AST 20 03/03/2013 0944   AST 26 01/27/2012 0845   ALT 27 03/03/2013 0944   ALT 42 01/27/2012 0845   ALKPHOS 69 03/03/2013 0944  ALKPHOS 66 01/27/2012 0845   BILITOT 1.29* 03/03/2013 0944   BILITOT 1.7* 01/27/2012 0845   GFRNONAA 57* 04/30/2010 0845   GFRAA  Value: >60        The eGFR has been calculated using the MDRD equation. This calculation has not been validated in all clinical situations. eGFR's persistently <60 mL/min signify possible Chronic Kidney Disease. 04/30/2010 0845    I No results found for this basename: SPEP, UPEP,  kappa and lambda light chains    Lab Results    Component Value Date   WBC 4.2 03/03/2013   NEUTROABS 2.2 03/03/2013   HGB 16.2 03/03/2013   HCT 50.2* 03/03/2013   MCV 79.0* 03/03/2013   PLT 170 03/03/2013      Chemistry      Component Value Date/Time   NA 144 03/03/2013 0944   NA 140 01/27/2012 0845   K 3.6 03/03/2013 0944   K 3.9 01/27/2012 0845   CL 106 08/04/2012 0957   CL 105 01/27/2012 0845   CO2 26 03/03/2013 0944   CO2 28 01/27/2012 0845   BUN 19.6 03/03/2013 0944   BUN 19 01/27/2012 0845   CREATININE 1.2 03/03/2013 0944   CREATININE 1.17 01/27/2012 0845      Component Value Date/Time   CALCIUM 9.3 03/03/2013 0944   CALCIUM 9.4 01/27/2012 0845   ALKPHOS 69 03/03/2013 0944   ALKPHOS 66 01/27/2012 0845   AST 20 03/03/2013 0944   AST 26 01/27/2012 0845   ALT 27 03/03/2013 0944   ALT 42 01/27/2012 0845   BILITOT 1.29* 03/03/2013 0944   BILITOT 1.7* 01/27/2012 0845      ASSESSMENT AND PLAN: 1.  Polycythemia (elevated red blood cell)  -  Bone marrow biopsy in 08/2012 did not show leukemia, lymphoma.  - Causes of secondary polycythemia have been ruled out. 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  I discussed with patient diagnostic/therapeutic phlebotomy weekly to keep hematocrit bellow 45%. Patient agreed. 2. Follow up: In 2-3 months.  Myra Rude, MD   03/04/2013 5:44 AM

## 2013-03-10 ENCOUNTER — Other Ambulatory Visit: Payer: Self-pay | Admitting: Hematology and Oncology

## 2013-03-10 ENCOUNTER — Other Ambulatory Visit (HOSPITAL_BASED_OUTPATIENT_CLINIC_OR_DEPARTMENT_OTHER): Payer: Medicare Other | Admitting: Lab

## 2013-03-10 ENCOUNTER — Ambulatory Visit (HOSPITAL_BASED_OUTPATIENT_CLINIC_OR_DEPARTMENT_OTHER): Payer: Medicare Other

## 2013-03-10 VITALS — BP 122/68 | HR 66 | Temp 96.9°F | Resp 18

## 2013-03-10 DIAGNOSIS — D751 Secondary polycythemia: Secondary | ICD-10-CM

## 2013-03-10 DIAGNOSIS — D45 Polycythemia vera: Secondary | ICD-10-CM

## 2013-03-10 LAB — CBC WITH DIFFERENTIAL/PLATELET
BASO%: 0.5 % (ref 0.0–2.0)
EOS%: 0.5 % (ref 0.0–7.0)
MCH: 25.4 pg — ABNORMAL LOW (ref 27.2–33.4)
MCHC: 32.2 g/dL (ref 32.0–36.0)
NEUT%: 57.5 % (ref 39.0–75.0)
RBC: 5.89 10*6/uL — ABNORMAL HIGH (ref 4.20–5.82)
RDW: 14.6 % (ref 11.0–14.6)
lymph#: 1.2 10*3/uL (ref 0.9–3.3)

## 2013-03-10 NOTE — Patient Instructions (Signed)
Therapeutic Phlebotomy Therapeutic phlebotomy is the controlled removal of blood from your body for the purpose of treating a medical condition. It is similar to donating blood. Usually, about a pint (470 mL) of blood is removed. The average adult has 9 to 12 pints (4.3 to 5.7 L) of blood. Therapeutic phlebotomy may be used to treat the following medical conditions:  Hemochromatosis. This is a condition in which there is too much iron in the blood.  Polycythemia vera. This is a condition in which there are too many red cells in the blood.  Porphyria cutanea tarda. This is a disease usually passed from one generation to the next (inherited). It is a condition in which an important part of hemoglobin is not made properly. This results in the build up of abnormal amounts of porphyrins in the body.  Sickle cell disease. This is an inherited disease. It is a condition in which the red blood cells form an abnormal crescent shape rather than a round shape. LET YOUR CAREGIVER KNOW ABOUT:  Allergies.  Medicines taken including herbs, eyedrops, over-the-counter medicines, and creams.  Use of steroids (by mouth or creams).  Previous problems with anesthetics or numbing medicine.  History of blood clots.  History of bleeding or blood problems.  Previous surgery.  Possibility of pregnancy, if this applies. RISKS AND COMPLICATIONS This is a simple and safe procedure. Problems are unlikely. However, problems can occur and may include:  Nausea or lightheadedness.  Low blood pressure.  Soreness, bleeding, swelling, or bruising at the needle insertion site.  Infection. BEFORE THE PROCEDURE  This is a procedure that can be done as an outpatient. Confirm the time that you need to arrive for your procedure. Confirm whether there is a need to fast or withhold any medications. It is helpful to wear clothing with sleeves that can be raised above the elbow. A blood sample may be done to determine the  amount of red blood cells or iron in your blood. Plan ahead of time to have someone drive you home after the procedure. PROCEDURE The entire procedure from preparation through recovery takes about 1 hour. The actual collection takes about 10 to 15 minutes.  A needle will be inserted into your vein.  Tubing and a collection bag will be attached to that needle.  Blood will flow through the needle and tubing into the collection bag.  You may be asked to open and close your hand slowly and continuously during the entire collection.  Once the specified amount of blood has been removed from your body, the collection bag and tubing will be clamped.  The needle will be removed.  Pressure will be held on the site of the needle insertion to stop the bleeding. Then a bandage will be placed over the needle insertion site. AFTER THE PROCEDURE  Your recovery will be assessed and monitored. If there are no problems, as an outpatient, you should be able to go home shortly after the procedure.  Document Released: 11/19/2010 Document Revised: 09/09/2011 Document Reviewed: 11/19/2010 [employer] Patient Information 2014 [employer].  

## 2013-03-10 NOTE — Progress Notes (Signed)
1025 Patient arrives for phlebotomy; he states he ate a ham and cheese sandwich for breakfast and refuses snack at this time. VSS. Will proceed with phlebotomy. Clayborn Heron, RN  [phone]    Patient denies lightheadedness or dizziness. VSS. Food and drink provided after phlebotomy. Patient monitored x30 minutes. Discharged to home in stable condition. Clayborn Heron, RN

## 2013-03-17 ENCOUNTER — Other Ambulatory Visit: Payer: Self-pay | Admitting: *Deleted

## 2013-03-17 ENCOUNTER — Other Ambulatory Visit (HOSPITAL_BASED_OUTPATIENT_CLINIC_OR_DEPARTMENT_OTHER): Payer: Medicare Other | Admitting: Lab

## 2013-03-17 DIAGNOSIS — D751 Secondary polycythemia: Secondary | ICD-10-CM

## 2013-03-17 DIAGNOSIS — D45 Polycythemia vera: Secondary | ICD-10-CM

## 2013-03-17 LAB — CBC WITH DIFFERENTIAL/PLATELET
Basophils Absolute: 0 10*3/uL (ref 0.0–0.1)
EOS%: 0.7 % (ref 0.0–7.0)
HGB: 14.6 g/dL (ref 13.0–17.1)
MCH: 25.6 pg — ABNORMAL LOW (ref 27.2–33.4)
NEUT#: 2.2 10*3/uL (ref 1.5–6.5)
RDW: 14.7 % — ABNORMAL HIGH (ref 11.0–14.6)
lymph#: 1.4 10*3/uL (ref 0.9–3.3)

## 2013-03-24 ENCOUNTER — Other Ambulatory Visit (HOSPITAL_BASED_OUTPATIENT_CLINIC_OR_DEPARTMENT_OTHER): Payer: Medicare Other | Admitting: Lab

## 2013-03-24 ENCOUNTER — Ambulatory Visit (HOSPITAL_BASED_OUTPATIENT_CLINIC_OR_DEPARTMENT_OTHER): Payer: Medicare Other

## 2013-03-24 DIAGNOSIS — D45 Polycythemia vera: Secondary | ICD-10-CM

## 2013-03-24 DIAGNOSIS — D751 Secondary polycythemia: Secondary | ICD-10-CM

## 2013-03-24 LAB — CBC WITH DIFFERENTIAL/PLATELET
Basophils Absolute: 0 10*3/uL (ref 0.0–0.1)
Eosinophils Absolute: 0 10*3/uL (ref 0.0–0.5)
HGB: 15.1 g/dL (ref 13.0–17.1)
MCV: 78.5 fL — ABNORMAL LOW (ref 79.3–98.0)
MONO#: 0.5 10*3/uL (ref 0.1–0.9)
MONO%: 11.1 % (ref 0.0–14.0)
NEUT#: 2.4 10*3/uL (ref 1.5–6.5)
RDW: 14.4 % (ref 11.0–14.6)
lymph#: 1.4 10*3/uL (ref 0.9–3.3)

## 2013-03-24 NOTE — Progress Notes (Signed)
Phlebotomy preformed on left antecubital.  500 grams obtained.  Pt tolerated well.  No problems post phlebotomy.  Pt instructed to keep up with hydration and protein over the next few days.  Pt repeated back instructions.  Pt to call with any concerns, questions, or changes.

## 2013-03-24 NOTE — Patient Instructions (Addendum)

## 2013-03-25 DIAGNOSIS — L723 Sebaceous cyst: Secondary | ICD-10-CM | POA: Diagnosis not present

## 2013-03-31 ENCOUNTER — Ambulatory Visit: Payer: Medicare Other

## 2013-03-31 ENCOUNTER — Other Ambulatory Visit: Payer: Self-pay | Admitting: Hematology and Oncology

## 2013-03-31 ENCOUNTER — Other Ambulatory Visit (HOSPITAL_BASED_OUTPATIENT_CLINIC_OR_DEPARTMENT_OTHER): Payer: Medicare Other | Admitting: Lab

## 2013-03-31 DIAGNOSIS — D45 Polycythemia vera: Secondary | ICD-10-CM | POA: Diagnosis not present

## 2013-03-31 DIAGNOSIS — D751 Secondary polycythemia: Secondary | ICD-10-CM

## 2013-03-31 LAB — CBC WITH DIFFERENTIAL/PLATELET
Basophils Absolute: 0 10*3/uL (ref 0.0–0.1)
Eosinophils Absolute: 0 10*3/uL (ref 0.0–0.5)
HCT: 42.4 % (ref 38.4–49.9)
HGB: 13.8 g/dL (ref 13.0–17.1)
LYMPH%: 35.1 % (ref 14.0–49.0)
MCV: 78.2 fL — ABNORMAL LOW (ref 79.3–98.0)
MONO%: 10.6 % (ref 0.0–14.0)
NEUT#: 2 10*3/uL (ref 1.5–6.5)
NEUT%: 52.9 % (ref 39.0–75.0)
Platelets: 188 10*3/uL (ref 140–400)

## 2013-03-31 NOTE — Progress Notes (Signed)
03/31/13 hematocrit 42.4.  Does not meet treatment parameters of Hct >=45 for phlebotomy.

## 2013-04-07 ENCOUNTER — Other Ambulatory Visit: Payer: Medicare Other | Admitting: Lab

## 2013-04-07 ENCOUNTER — Ambulatory Visit: Payer: Medicare Other | Admitting: Hematology and Oncology

## 2013-04-08 ENCOUNTER — Telehealth: Payer: Self-pay | Admitting: Hematology and Oncology

## 2013-04-08 ENCOUNTER — Ambulatory Visit (HOSPITAL_BASED_OUTPATIENT_CLINIC_OR_DEPARTMENT_OTHER): Payer: Medicare Other | Admitting: Lab

## 2013-04-08 ENCOUNTER — Ambulatory Visit (HOSPITAL_BASED_OUTPATIENT_CLINIC_OR_DEPARTMENT_OTHER): Payer: Medicare Other

## 2013-04-08 DIAGNOSIS — D45 Polycythemia vera: Secondary | ICD-10-CM | POA: Diagnosis not present

## 2013-04-08 DIAGNOSIS — D751 Secondary polycythemia: Secondary | ICD-10-CM

## 2013-04-08 LAB — CBC WITH DIFFERENTIAL/PLATELET
BASO%: 0.6 % (ref 0.0–2.0)
Basophils Absolute: 0 10*3/uL (ref 0.0–0.1)
EOS%: 0.4 % (ref 0.0–7.0)
HCT: 45.2 % (ref 38.4–49.9)
HGB: 14.8 g/dL (ref 13.0–17.1)
LYMPH%: 31 % (ref 14.0–49.0)
MCH: 25.3 pg — ABNORMAL LOW (ref 27.2–33.4)
MCHC: 32.7 g/dL (ref 32.0–36.0)
NEUT%: 59.5 % (ref 39.0–75.0)
Platelets: 204 10*3/uL (ref 140–400)

## 2013-04-08 NOTE — Patient Instructions (Signed)

## 2013-04-08 NOTE — Telephone Encounter (Signed)
pt missed lab yester r/s to today b4 phlebotomy

## 2013-04-08 NOTE — Progress Notes (Signed)
500 gram phlebotomy performed on patient at left antecubital.  Pt without complaints.  Pt given written and verbal instructions about keeping up fluid and protein intake for the next couple of days.  Pt verbalized understanding through teach back method.

## 2013-04-14 ENCOUNTER — Other Ambulatory Visit: Payer: Self-pay | Admitting: Hematology and Oncology

## 2013-04-14 ENCOUNTER — Ambulatory Visit: Payer: Medicare Other

## 2013-04-14 ENCOUNTER — Other Ambulatory Visit (HOSPITAL_BASED_OUTPATIENT_CLINIC_OR_DEPARTMENT_OTHER): Payer: Medicare Other | Admitting: Lab

## 2013-04-14 DIAGNOSIS — D45 Polycythemia vera: Secondary | ICD-10-CM

## 2013-04-14 DIAGNOSIS — D751 Secondary polycythemia: Secondary | ICD-10-CM

## 2013-04-14 LAB — CBC WITH DIFFERENTIAL/PLATELET
BASO%: 1.1 % (ref 0.0–2.0)
Basophils Absolute: 0 10*3/uL (ref 0.0–0.1)
EOS%: 0.6 % (ref 0.0–7.0)
HCT: 42.6 % (ref 38.4–49.9)
MCH: 25 pg — ABNORMAL LOW (ref 27.2–33.4)
MCHC: 32.3 g/dL (ref 32.0–36.0)
MCV: 77.4 fL — ABNORMAL LOW (ref 79.3–98.0)
MONO%: 9.9 % (ref 0.0–14.0)
NEUT%: 52 % (ref 39.0–75.0)
lymph#: 1.5 10*3/uL (ref 0.9–3.3)

## 2013-04-14 NOTE — Progress Notes (Signed)
Phlebotomy not performed. Hematocrit 42.6. Does not meet treatment parameters. Patient notified and discharged home.

## 2013-04-15 ENCOUNTER — Telehealth: Payer: Self-pay | Admitting: Hematology and Oncology

## 2013-04-15 NOTE — Telephone Encounter (Signed)
lvm for pt regarding to all appt with added est..Marland Kitchen

## 2013-04-20 ENCOUNTER — Other Ambulatory Visit: Payer: Self-pay | Admitting: Hematology and Oncology

## 2013-04-20 DIAGNOSIS — D751 Secondary polycythemia: Secondary | ICD-10-CM

## 2013-04-21 ENCOUNTER — Other Ambulatory Visit (HOSPITAL_BASED_OUTPATIENT_CLINIC_OR_DEPARTMENT_OTHER): Payer: Medicare Other | Admitting: Lab

## 2013-04-21 ENCOUNTER — Telehealth: Payer: Self-pay | Admitting: *Deleted

## 2013-04-21 DIAGNOSIS — D751 Secondary polycythemia: Secondary | ICD-10-CM

## 2013-04-21 DIAGNOSIS — D45 Polycythemia vera: Secondary | ICD-10-CM

## 2013-04-21 LAB — CBC WITH DIFFERENTIAL/PLATELET
BASO%: 0.6 % (ref 0.0–2.0)
EOS%: 0.5 % (ref 0.0–7.0)
Eosinophils Absolute: 0 10*3/uL (ref 0.0–0.5)
LYMPH%: 36.2 % (ref 14.0–49.0)
MCH: 24.7 pg — ABNORMAL LOW (ref 27.2–33.4)
MCHC: 31.6 g/dL — ABNORMAL LOW (ref 32.0–36.0)
MCV: 77.9 fL — ABNORMAL LOW (ref 79.3–98.0)
MONO%: 10.8 % (ref 0.0–14.0)
NEUT#: 1.8 10*3/uL (ref 1.5–6.5)
Platelets: 210 10*3/uL (ref 140–400)
RBC: 5.71 10*6/uL (ref 4.20–5.82)
RDW: 14.9 % — ABNORMAL HIGH (ref 11.0–14.6)

## 2013-04-21 NOTE — Telephone Encounter (Signed)
Dr. Bertis Ruddy reviewed pt's CBC today,  States no need for phlebotomy and cancel weekly labs until pt sees her on 05/12/13.   Informed pt in lobby and he verbalized understanding.

## 2013-04-28 ENCOUNTER — Other Ambulatory Visit: Payer: Medicare Other | Admitting: Lab

## 2013-05-05 ENCOUNTER — Other Ambulatory Visit: Payer: Medicare Other | Admitting: Lab

## 2013-05-06 ENCOUNTER — Other Ambulatory Visit: Payer: Self-pay

## 2013-05-12 ENCOUNTER — Other Ambulatory Visit (HOSPITAL_BASED_OUTPATIENT_CLINIC_OR_DEPARTMENT_OTHER): Payer: Medicare Other | Admitting: Lab

## 2013-05-12 ENCOUNTER — Other Ambulatory Visit: Payer: Medicare Other | Admitting: Lab

## 2013-05-12 ENCOUNTER — Ambulatory Visit (HOSPITAL_BASED_OUTPATIENT_CLINIC_OR_DEPARTMENT_OTHER): Payer: Medicare Other | Admitting: Hematology and Oncology

## 2013-05-12 ENCOUNTER — Encounter: Payer: Self-pay | Admitting: Hematology and Oncology

## 2013-05-12 VITALS — BP 142/82 | HR 71 | Temp 98.7°F | Resp 18 | Ht 68.0 in | Wt 187.7 lb

## 2013-05-12 DIAGNOSIS — D751 Secondary polycythemia: Secondary | ICD-10-CM | POA: Diagnosis not present

## 2013-05-12 LAB — CBC WITH DIFFERENTIAL/PLATELET
BASO%: 0.8 % (ref 0.0–2.0)
EOS%: 0.6 % (ref 0.0–7.0)
HCT: 47.3 % (ref 38.4–49.9)
MCH: 23.9 pg — ABNORMAL LOW (ref 27.2–33.4)
MCHC: 31.1 g/dL — ABNORMAL LOW (ref 32.0–36.0)
MONO#: 0.4 10*3/uL (ref 0.1–0.9)
NEUT%: 47.5 % (ref 39.0–75.0)
RBC: 6.16 10*6/uL — ABNORMAL HIGH (ref 4.20–5.82)
RDW: 14.8 % — ABNORMAL HIGH (ref 11.0–14.6)
WBC: 4.2 10*3/uL (ref 4.0–10.3)
lymph#: 1.7 10*3/uL (ref 0.9–3.3)

## 2013-05-12 LAB — FERRITIN CHCC: Ferritin: 65 ng/ml (ref 22–316)

## 2013-05-12 NOTE — Progress Notes (Signed)
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 In 2014 he had a bone marrow aspirate and biopsy that came back nondiagnostic for myeloproliferative disorder. Check 2 mutation testing was negative. The patient had received 3 units of phlebotomy sessions and remain on aspirin INTERVAL HISTORY: Cory [name] 77 y.o. male returns for further followup. He denies any signs and symptoms of erythrocytosis. He denies any recent fever, chills, night sweats or abnormal weight loss  I have reviewed the past medical history, past surgical history, social history and family history with the patient and they are unchanged from previous note.  ALLERGIES:  has No Known Allergies.  MEDICATIONS:  Current Outpatient Prescriptions  Medication Sig Dispense Refill  . aspirin 81 MG tablet Take 81 mg by mouth daily.      . Atorvastatin Calcium (LIPITOR PO) Take by mouth daily.      . Benazepril HCl (LOTENSIN PO) Take by mouth daily.      . Ezetimibe (ZETIA PO) Take by mouth daily.       No current facility-administered medications for this visit.     REVIEW OF SYSTEMS:   Constitutional: Denies fevers, chills or night sweats Eyes: Denies blurriness of vision Ears, nose, mouth, throat, and face: Denies mucositis or sore throat Respiratory: Denies cough, dyspnea or wheezes Cardiovascular: Denies palpitation, chest discomfort or lower extremity swelling Gastrointestinal:  Denies nausea, heartburn or change in bowel habits Skin: Denies abnormal skin rashes Lymphatics: Denies new lymphadenopathy or easy bruising Neurological:Denies numbness, tingling or new weaknesses Behavioral/Psych: Mood is stable, no new changes  All other systems were reviewed with the patient and are  negative.  PHYSICAL EXAMINATION: ECOG PERFORMANCE STATUS: 0 - Asymptomatic  Filed Vitals:   05/12/13 1141  BP: 142/82  Pulse: 71  Temp: 98.7 F (37.1 C)  Resp: 18   Filed Weights   05/12/13 1141  Weight: 187 lb 11.2 oz (85.14 kg)    GENERAL:alert, no distress and comfortable SKIN: skin color, texture, turgor are normal, no rashes or significant lesions EYES: normal, Conjunctiva are pink and non-injected, sclera clear OROPHARYNX:no exudate, no erythema and lips, buccal mucosa, and tongue normal  NECK: supple, thyroid normal size, non-tender, without nodularity LYMPH:  no palpable lymphadenopathy in the cervical, axillary or inguinal LUNGS: clear to auscultation and percussion with normal breathing effort HEART: regular rate & rhythm and no murmurs and no lower extremity edema ABDOMEN:abdomen soft, non-tender and normal bowel sounds Musculoskeletal:no cyanosis of digits and no clubbing  NEURO: alert & oriented x 3 with fluent speech, no focal motor/sensory deficits  LABORATORY DATA:  I have reviewed the data as listed Results for orders placed in visit on 05/12/13 (from the past 48 hour(s))  CBC WITH DIFFERENTIAL     Status: Abnormal   Collection Time    05/12/13 11:22 AM      Result Value Range   WBC 4.2  4.0 - 10.3 10e3/uL   NEUT# 2.0  1.5 - 6.5 10e3/uL   HGB 14.7  13.0 - 17.1 g/dL   HCT 19.1  47.8 - 29.5 %   Platelets 181  140 - 400 10e3/uL   MCV 76.8 (*) 79.3 - 98.0 fL   MCH 23.9 (*) 27.2 - 33.4 pg   MCHC 31.1 (*) 32.0 - 36.0 g/dL   RBC 6.21 (*) 3.08 - 6.57  10e6/uL   RDW 14.8 (*) 11.0 - 14.6 %   lymph# 1.7  0.9 - 3.3 10e3/uL   MONO# 0.4  0.1 - 0.9 10e3/uL   Eosinophils Absolute 0.0  0.0 - 0.5 10e3/uL   Basophils Absolute 0.0  0.0 - 0.1 10e3/uL   NEUT% 47.5  39.0 - 75.0 %   LYMPH% 41.0  14.0 - 49.0 %   MONO% 10.1  0.0 - 14.0 %   EOS% 0.6  0.0 - 7.0 %   BASO% 0.8  0.0 - 2.0 %    ASSESSMENT:  #1 secondary erythrocytosis  PLAN:  #1 secondary  erythrocytosis Previously, he had evidence of iron overload. He has 3 phlebotomy sessions and his most recent ferritin is back to within normal range. The patient and microcytosis, along with increased RBC count, is diagnostic for hemoglobinopathy, likely thalassemia trait as the patient is not anemic. I think his bone marrow biopsy was mildly hypercellular due to increased cell turnover. The patient does not have a bone marrow malignancy. I am discharging him from the clinic. He does not need further followup appointment here.  All questions were answered. The patient knows to call the clinic with any problems, questions or concerns. No barriers to learning was detected.  I spent 15 minutes counseling the patient face to face. The total time spent in the appointment was 20 minutes and more than 50% was on counseling.     [facility], Kursten Kruk, MD 05/12/2013 12:14 PM

## 2013-05-18 ENCOUNTER — Telehealth: Payer: Self-pay | Admitting: *Deleted

## 2013-05-18 NOTE — Telephone Encounter (Signed)
Pt called states he was called about lab appt this week,  But Dr. Bertis Ruddy told him he did not need to come back to our clinic anymore.  States he has been discharged from our office.  According to last office note,  Dr. Bertis Ruddy did d/c pt from clinic.  Informed pt I will cancel all upcoming appts.  He verbalized understanding.

## 2013-05-19 ENCOUNTER — Other Ambulatory Visit: Payer: Medicare Other | Admitting: Lab

## 2013-05-26 ENCOUNTER — Other Ambulatory Visit: Payer: Medicare Other | Admitting: Lab

## 2013-05-26 ENCOUNTER — Ambulatory Visit: Payer: Medicare Other

## 2013-06-02 ENCOUNTER — Other Ambulatory Visit: Payer: Medicare Other | Admitting: Lab

## 2013-06-02 ENCOUNTER — Ambulatory Visit: Payer: Medicare Other

## 2013-06-03 DIAGNOSIS — IMO0002 Reserved for concepts with insufficient information to code with codable children: Secondary | ICD-10-CM | POA: Diagnosis not present

## 2013-06-03 DIAGNOSIS — J069 Acute upper respiratory infection, unspecified: Secondary | ICD-10-CM | POA: Diagnosis not present

## 2013-06-09 ENCOUNTER — Other Ambulatory Visit: Payer: Medicare Other | Admitting: Lab

## 2013-06-16 ENCOUNTER — Other Ambulatory Visit: Payer: Medicare Other | Admitting: Lab

## 2013-06-23 ENCOUNTER — Other Ambulatory Visit: Payer: Medicare Other | Admitting: Lab

## 2013-06-23 ENCOUNTER — Ambulatory Visit: Payer: Medicare Other

## 2013-08-02 DIAGNOSIS — Z23 Encounter for immunization: Secondary | ICD-10-CM | POA: Diagnosis not present

## 2013-08-02 DIAGNOSIS — M5430 Sciatica, unspecified side: Secondary | ICD-10-CM | POA: Diagnosis not present

## 2013-08-02 DIAGNOSIS — Z125 Encounter for screening for malignant neoplasm of prostate: Secondary | ICD-10-CM | POA: Diagnosis not present

## 2013-08-02 DIAGNOSIS — I10 Essential (primary) hypertension: Secondary | ICD-10-CM | POA: Diagnosis not present

## 2013-08-02 DIAGNOSIS — D696 Thrombocytopenia, unspecified: Secondary | ICD-10-CM | POA: Diagnosis not present

## 2013-08-02 DIAGNOSIS — N529 Male erectile dysfunction, unspecified: Secondary | ICD-10-CM | POA: Diagnosis not present

## 2013-08-02 DIAGNOSIS — Z0000 Encounter for general adult medical examination without abnormal findings: Secondary | ICD-10-CM | POA: Diagnosis not present

## 2013-08-02 DIAGNOSIS — M1990 Unspecified osteoarthritis, unspecified site: Secondary | ICD-10-CM | POA: Diagnosis not present

## 2013-08-02 DIAGNOSIS — E7800 Pure hypercholesterolemia, unspecified: Secondary | ICD-10-CM | POA: Diagnosis not present

## 2013-08-02 DIAGNOSIS — D751 Secondary polycythemia: Secondary | ICD-10-CM | POA: Diagnosis not present

## 2013-08-12 DIAGNOSIS — H903 Sensorineural hearing loss, bilateral: Secondary | ICD-10-CM | POA: Diagnosis not present

## 2013-08-18 DIAGNOSIS — R972 Elevated prostate specific antigen [PSA]: Secondary | ICD-10-CM | POA: Diagnosis not present

## 2013-09-30 DIAGNOSIS — N401 Enlarged prostate with lower urinary tract symptoms: Secondary | ICD-10-CM | POA: Diagnosis not present

## 2013-09-30 DIAGNOSIS — N139 Obstructive and reflux uropathy, unspecified: Secondary | ICD-10-CM | POA: Diagnosis not present

## 2013-09-30 DIAGNOSIS — R3915 Urgency of urination: Secondary | ICD-10-CM | POA: Diagnosis not present

## 2013-09-30 DIAGNOSIS — R972 Elevated prostate specific antigen [PSA]: Secondary | ICD-10-CM | POA: Diagnosis not present

## 2013-10-28 DIAGNOSIS — R972 Elevated prostate specific antigen [PSA]: Secondary | ICD-10-CM | POA: Diagnosis not present

## 2013-10-28 DIAGNOSIS — N401 Enlarged prostate with lower urinary tract symptoms: Secondary | ICD-10-CM | POA: Diagnosis not present

## 2013-10-28 DIAGNOSIS — N139 Obstructive and reflux uropathy, unspecified: Secondary | ICD-10-CM | POA: Diagnosis not present

## 2013-10-28 DIAGNOSIS — R3915 Urgency of urination: Secondary | ICD-10-CM | POA: Diagnosis not present

## 2013-11-02 DIAGNOSIS — R3915 Urgency of urination: Secondary | ICD-10-CM | POA: Diagnosis not present

## 2013-11-02 DIAGNOSIS — N139 Obstructive and reflux uropathy, unspecified: Secondary | ICD-10-CM | POA: Diagnosis not present

## 2013-11-02 DIAGNOSIS — R972 Elevated prostate specific antigen [PSA]: Secondary | ICD-10-CM | POA: Diagnosis not present

## 2013-11-02 DIAGNOSIS — N401 Enlarged prostate with lower urinary tract symptoms: Secondary | ICD-10-CM | POA: Diagnosis not present

## 2013-12-16 DIAGNOSIS — C61 Malignant neoplasm of prostate: Secondary | ICD-10-CM | POA: Diagnosis not present

## 2013-12-16 DIAGNOSIS — IMO0002 Reserved for concepts with insufficient information to code with codable children: Secondary | ICD-10-CM | POA: Diagnosis not present

## 2013-12-16 DIAGNOSIS — D075 Carcinoma in situ of prostate: Secondary | ICD-10-CM | POA: Diagnosis not present

## 2013-12-16 DIAGNOSIS — R972 Elevated prostate specific antigen [PSA]: Secondary | ICD-10-CM | POA: Diagnosis not present

## 2013-12-21 ENCOUNTER — Other Ambulatory Visit (HOSPITAL_COMMUNITY): Payer: Self-pay | Admitting: Urology

## 2013-12-21 DIAGNOSIS — C61 Malignant neoplasm of prostate: Secondary | ICD-10-CM

## 2014-01-06 ENCOUNTER — Encounter (HOSPITAL_COMMUNITY): Payer: Self-pay

## 2014-01-06 ENCOUNTER — Encounter (HOSPITAL_COMMUNITY)
Admission: RE | Admit: 2014-01-06 | Discharge: 2014-01-06 | Disposition: A | Discharge status: Home / Self Care | Payer: Medicare Other | Source: Ambulatory Visit | Attending: Urology | Admitting: Urology

## 2014-01-06 DIAGNOSIS — C61 Malignant neoplasm of prostate: Secondary | ICD-10-CM | POA: Diagnosis not present

## 2014-01-06 HISTORY — DX: Malignant neoplasm of prostate: C61

## 2014-01-06 MED ORDER — TECHNETIUM TC 99M MEDRONATE IV KIT
26.2000 | PACK | Freq: Once | INTRAVENOUS | Status: AC | PRN
  Administered 2014-01-06: 26.2 via INTRAVENOUS

## 2014-01-17 DIAGNOSIS — C61 Malignant neoplasm of prostate: Secondary | ICD-10-CM | POA: Diagnosis not present

## 2014-01-17 DIAGNOSIS — R3915 Urgency of urination: Secondary | ICD-10-CM | POA: Diagnosis not present

## 2014-01-17 DIAGNOSIS — N401 Enlarged prostate with lower urinary tract symptoms: Secondary | ICD-10-CM | POA: Diagnosis not present

## 2014-01-17 DIAGNOSIS — N139 Obstructive and reflux uropathy, unspecified: Secondary | ICD-10-CM | POA: Diagnosis not present

## 2014-01-18 ENCOUNTER — Other Ambulatory Visit: Payer: Self-pay | Admitting: Urology

## 2014-01-26 ENCOUNTER — Other Ambulatory Visit (HOSPITAL_COMMUNITY): Payer: Self-pay | Admitting: Urology

## 2014-01-26 ENCOUNTER — Ambulatory Visit (HOSPITAL_COMMUNITY)
Admission: RE | Admit: 2014-01-26 | Discharge: 2014-01-26 | Disposition: A | Discharge status: Home / Self Care | Payer: Medicare Other | Source: Ambulatory Visit | Attending: Urology | Admitting: Urology

## 2014-01-26 DIAGNOSIS — H40019 Open angle with borderline findings, low risk, unspecified eye: Secondary | ICD-10-CM | POA: Diagnosis not present

## 2014-01-26 DIAGNOSIS — M6281 Muscle weakness (generalized): Secondary | ICD-10-CM | POA: Diagnosis not present

## 2014-01-26 DIAGNOSIS — M899 Disorder of bone, unspecified: Secondary | ICD-10-CM | POA: Diagnosis not present

## 2014-01-26 DIAGNOSIS — C61 Malignant neoplasm of prostate: Secondary | ICD-10-CM

## 2014-01-26 DIAGNOSIS — R3915 Urgency of urination: Secondary | ICD-10-CM | POA: Diagnosis not present

## 2014-01-26 DIAGNOSIS — H2510 Age-related nuclear cataract, unspecified eye: Secondary | ICD-10-CM | POA: Diagnosis not present

## 2014-01-26 DIAGNOSIS — M949 Disorder of cartilage, unspecified: Secondary | ICD-10-CM

## 2014-01-26 DIAGNOSIS — R279 Unspecified lack of coordination: Secondary | ICD-10-CM | POA: Diagnosis not present

## 2014-01-31 DIAGNOSIS — H40019 Open angle with borderline findings, low risk, unspecified eye: Secondary | ICD-10-CM | POA: Diagnosis not present

## 2014-01-31 DIAGNOSIS — H2510 Age-related nuclear cataract, unspecified eye: Secondary | ICD-10-CM | POA: Diagnosis not present

## 2014-01-31 DIAGNOSIS — R279 Unspecified lack of coordination: Secondary | ICD-10-CM | POA: Diagnosis not present

## 2014-01-31 DIAGNOSIS — M6281 Muscle weakness (generalized): Secondary | ICD-10-CM | POA: Diagnosis not present

## 2014-01-31 DIAGNOSIS — C61 Malignant neoplasm of prostate: Secondary | ICD-10-CM | POA: Diagnosis not present

## 2014-02-15 ENCOUNTER — Encounter (HOSPITAL_COMMUNITY): Payer: Self-pay | Admitting: Pharmacy Technician

## 2014-02-16 NOTE — Patient Instructions (Addendum)
Cory [name]  02/16/2014                           YOUR PROCEDURE IS SCHEDULED ON: 02/23/14               ENTER THRU [facility] ENTRANCE AND                            FOLLOW  SIGNS TO SHORT STAY CENTER                 ARRIVE AT SHORT STAY AT: 6:30 AM               CALL THIS NUMBER IF ANY PROBLEMS THE DAY OF SURGERY :               [phone]                                REMEMBER:   Do not eat food or drink liquids AFTER MIDNIGHT                  Take these medicines the morning of surgery with               A SIPS OF WATER :   ZETIA / LIPITOR      Do not wear jewelry, make-up   Do not wear lotions, powders, or perfumes.   Do not shave legs or underarms 12 hrs. before surgery (men may shave face)  Do not bring valuables to the hospital.  Contacts, dentures or bridgework may not be worn into surgery.  Leave suitcase in the car. After surgery it may be brought to your room.  For patients admitted to the hospital more than one night, checkout time is            11:00 AM                                                    ________________________________________________________________________                                                                        [place]  Before surgery, you can play an important role.  Because skin is not sterile, your skin needs to be as free of germs as possible.  You can reduce the number of germs on your skin by washing with CHG (chlorahexidine gluconate) soap before surgery.  CHG is an antiseptic cleaner which kills germs and bonds with the skin to continue killing germs even after washing. Please DO NOT use if you have an allergy to CHG or antibacterial soaps.  If your skin becomes reddened/irritated stop using the CHG and inform your nurse when you arrive at Short Stay. Do not shave (including legs and underarms) for at least 48 hours prior to the first CHG shower.  You may shave  your face. Please follow  these instructions carefully:   1.  Shower with CHG Soap the night before surgery and the  morning of Surgery.   2.  If you choose to wash your hair, wash your hair first as usual with your  normal  Shampoo.   3.  After you shampoo, rinse your hair and body thoroughly to remove the  shampoo.                                         4.  Use CHG as you would any other liquid soap.  You can apply chg directly  to the skin and wash . Gently wash with scrungie or clean wascloth    5.  Apply the CHG Soap to your body ONLY FROM THE NECK DOWN.   Do not use on open                           Wound or open sores. Avoid contact with eyes, ears mouth and genitals (private parts).                        Genitals (private parts) with your normal soap.              6.  Wash thoroughly, paying special attention to the area where your surgery  will be performed.   7.  Thoroughly rinse your body with warm water from the neck down.   8.  DO NOT shower/wash with your normal soap after using and rinsing off  the CHG Soap .                9.  Pat yourself dry with a clean towel.             10.  Wear clean pajamas.             11.  Place clean sheets on your bed the night of your first shower and do not  sleep with pets.  Day of Surgery : Do not apply any lotions/deodorants the morning of surgery.  Please wear clean clothes to the hospital/surgery center.  FAILURE TO FOLLOW THESE INSTRUCTIONS MAY RESULT IN THE CANCELLATION OF YOUR SURGERY    PATIENT SIGNATURE_________________________________  ______________________________________________________________________

## 2014-02-17 ENCOUNTER — Encounter (HOSPITAL_COMMUNITY)
Admission: RE | Admit: 2014-02-17 | Discharge: 2014-02-17 | Disposition: A | Discharge status: Home / Self Care | Payer: Medicare Other | Source: Ambulatory Visit | Attending: Urology | Admitting: Urology

## 2014-02-17 ENCOUNTER — Encounter (HOSPITAL_COMMUNITY): Payer: Self-pay

## 2014-02-17 DIAGNOSIS — C61 Malignant neoplasm of prostate: Secondary | ICD-10-CM | POA: Diagnosis not present

## 2014-02-17 DIAGNOSIS — Z01818 Encounter for other preprocedural examination: Secondary | ICD-10-CM | POA: Insufficient documentation

## 2014-02-17 HISTORY — DX: Thalassemia minor: D56.3

## 2014-02-17 LAB — BASIC METABOLIC PANEL
Anion gap: 11 (ref 5–15)
BUN: 16 mg/dL (ref 6–23)
CO2: 24 mEq/L (ref 19–32)
CREATININE: 1.05 mg/dL (ref 0.50–1.35)
Calcium: 9.3 mg/dL (ref 8.4–10.5)
Chloride: 104 mEq/L (ref 96–112)
GFR, EST AFRICAN AMERICAN: 78 mL/min — AB (ref >90)
GFR, EST NON AFRICAN AMERICAN: 67 mL/min — AB (ref >90)
Glucose, Bld: 107 mg/dL — ABNORMAL HIGH (ref 70–99)
Potassium: 3.8 mEq/L (ref 3.7–5.3)
Sodium: 139 mEq/L (ref 137–147)

## 2014-02-17 LAB — ABO/RH: ABO/RH(D): O POS

## 2014-02-17 LAB — CBC
HEMATOCRIT: 48.1 % (ref 39.0–52.0)
Hemoglobin: 16.4 g/dL (ref 13.0–17.0)
MCH: 26.1 pg (ref 26.0–34.0)
MCHC: 34.1 g/dL (ref 30.0–36.0)
MCV: 76.5 fL — AB (ref 78.0–100.0)
PLATELETS: 159 10*3/uL (ref 150–400)
RBC: 6.29 MIL/uL — ABNORMAL HIGH (ref 4.22–5.81)
RDW: 15.6 % — AB (ref 11.5–15.5)
WBC: 3.1 10*3/uL — ABNORMAL LOW (ref 4.0–10.5)

## 2014-02-17 NOTE — Progress Notes (Signed)
02/17/14 0910  OBSTRUCTIVE SLEEP APNEA  Have you ever been diagnosed with sleep apnea through a sleep study? No  Do you snore loudly (loud enough to be heard through closed doors)?  1  Do you often feel tired, fatigued, or sleepy during the daytime? 0  Has anyone observed you stop breathing during your sleep? 0  Do you have, or are you being treated for high blood pressure? 1  BMI more than 35 kg/m2? 0  Age over 76 years old? 1  Neck circumference greater than 40 cm/16 inches? 1  Gender: 1  Obstructive Sleep Apnea Score 5  Score 4 or greater  Results sent to PCP

## 2014-02-18 DIAGNOSIS — K219 Gastro-esophageal reflux disease without esophagitis: Secondary | ICD-10-CM | POA: Diagnosis not present

## 2014-02-18 DIAGNOSIS — C61 Malignant neoplasm of prostate: Secondary | ICD-10-CM | POA: Diagnosis not present

## 2014-02-18 DIAGNOSIS — D751 Secondary polycythemia: Secondary | ICD-10-CM | POA: Diagnosis not present

## 2014-02-18 DIAGNOSIS — E7800 Pure hypercholesterolemia, unspecified: Secondary | ICD-10-CM | POA: Diagnosis not present

## 2014-02-18 DIAGNOSIS — I10 Essential (primary) hypertension: Secondary | ICD-10-CM | POA: Diagnosis not present

## 2014-02-21 LAB — TYPE AND SCREEN
ABO/RH(D): O POS
Antibody Screen: NEGATIVE

## 2014-02-23 ENCOUNTER — Encounter (HOSPITAL_COMMUNITY): Payer: Self-pay | Admitting: *Deleted

## 2014-02-23 ENCOUNTER — Inpatient Hospital Stay (HOSPITAL_COMMUNITY): Payer: Medicare Other | Admitting: Anesthesiology

## 2014-02-23 ENCOUNTER — Encounter (HOSPITAL_COMMUNITY): Payer: Medicare Other | Admitting: Anesthesiology

## 2014-02-23 ENCOUNTER — Inpatient Hospital Stay (HOSPITAL_COMMUNITY)
Admission: RE | Admit: 2014-02-23 | Discharge: 2014-02-24 | DRG: 708 | Disposition: A | Discharge status: Home / Self Care | Payer: Medicare Other | Source: Ambulatory Visit | Attending: Urology | Admitting: Urology

## 2014-02-23 ENCOUNTER — Encounter (HOSPITAL_COMMUNITY)
Admission: RE | Disposition: A | Discharge status: Home / Self Care | Payer: Self-pay | Source: Ambulatory Visit | Attending: Urology

## 2014-02-23 DIAGNOSIS — R000 Tachycardia, unspecified: Secondary | ICD-10-CM | POA: Diagnosis not present

## 2014-02-23 DIAGNOSIS — Z87891 Personal history of nicotine dependence: Secondary | ICD-10-CM | POA: Diagnosis not present

## 2014-02-23 DIAGNOSIS — Z7982 Long term (current) use of aspirin: Secondary | ICD-10-CM | POA: Diagnosis not present

## 2014-02-23 DIAGNOSIS — I498 Other specified cardiac arrhythmias: Secondary | ICD-10-CM

## 2014-02-23 DIAGNOSIS — N401 Enlarged prostate with lower urinary tract symptoms: Secondary | ICD-10-CM | POA: Diagnosis present

## 2014-02-23 DIAGNOSIS — Z01812 Encounter for preprocedural laboratory examination: Secondary | ICD-10-CM

## 2014-02-23 DIAGNOSIS — Z01810 Encounter for preprocedural cardiovascular examination: Secondary | ICD-10-CM

## 2014-02-23 DIAGNOSIS — N138 Other obstructive and reflux uropathy: Secondary | ICD-10-CM | POA: Diagnosis present

## 2014-02-23 DIAGNOSIS — T884XXA Failed or difficult intubation, initial encounter: Secondary | ICD-10-CM

## 2014-02-23 DIAGNOSIS — C61 Malignant neoplasm of prostate: Principal | ICD-10-CM | POA: Diagnosis present

## 2014-02-23 DIAGNOSIS — I10 Essential (primary) hypertension: Secondary | ICD-10-CM | POA: Diagnosis present

## 2014-02-23 DIAGNOSIS — Z79899 Other long term (current) drug therapy: Secondary | ICD-10-CM | POA: Diagnosis not present

## 2014-02-23 DIAGNOSIS — I499 Cardiac arrhythmia, unspecified: Secondary | ICD-10-CM

## 2014-02-23 DIAGNOSIS — Z8546 Personal history of malignant neoplasm of prostate: Secondary | ICD-10-CM | POA: Diagnosis present

## 2014-02-23 HISTORY — PX: ROBOT ASSISTED LAPAROSCOPIC RADICAL PROSTATECTOMY: SHX5141

## 2014-02-23 HISTORY — PX: LYMPHADENECTOMY: SHX5960

## 2014-02-23 HISTORY — DX: Failed or difficult intubation, initial encounter: T88.4XXA

## 2014-02-23 LAB — HEMOGLOBIN AND HEMATOCRIT, BLOOD
HCT: 47.7 % (ref 39.0–52.0)
Hemoglobin: 15.1 g/dL (ref 13.0–17.0)

## 2014-02-23 SURGERY — ROBOTIC ASSISTED LAPAROSCOPIC RADICAL PROSTATECTOMY
Anesthesia: General

## 2014-02-23 MED ORDER — ACETAMINOPHEN 325 MG PO TABS: 650.0000 mg | ORAL_TABLET | ORAL | Status: DC | PRN

## 2014-02-23 MED ORDER — PROPOFOL 10 MG/ML IV BOLUS
INTRAVENOUS | Status: AC
  Filled 2014-02-23: qty 20

## 2014-02-23 MED ORDER — PROMETHAZINE HCL 25 MG/ML IJ SOLN: 6.2500 mg | INTRAMUSCULAR | Status: DC | PRN

## 2014-02-23 MED ORDER — ONDANSETRON HCL 4 MG/2ML IJ SOLN
INTRAMUSCULAR | Status: AC
  Filled 2014-02-23: qty 2

## 2014-02-23 MED ORDER — CISATRACURIUM BESYLATE (PF) 10 MG/5ML IV SOLN
INTRAVENOUS | Status: DC | PRN
  Administered 2014-02-23: 6 mg via INTRAVENOUS
  Administered 2014-02-23: 2 mg via INTRAVENOUS
  Administered 2014-02-23: 14 mg via INTRAVENOUS
  Administered 2014-02-23: 4 mg via INTRAVENOUS
  Administered 2014-02-23: 2 mg via INTRAVENOUS

## 2014-02-23 MED ORDER — METOPROLOL TARTRATE 12.5 MG HALF TABLET
12.5000 mg | ORAL_TABLET | Freq: Two times a day (BID) | ORAL | Status: DC
Start: 2014-02-23 — End: 2014-02-24
  Administered 2014-02-23 – 2014-02-24 (×2): 12.5 mg via ORAL
  Filled 2014-02-23 (×3): qty 1

## 2014-02-23 MED ORDER — HYDROCODONE-ACETAMINOPHEN 5-325 MG PO TABS
1.0000 | ORAL_TABLET | ORAL | Status: DC | PRN
  Administered 2014-02-24: 2 via ORAL
  Filled 2014-02-23: qty 2

## 2014-02-23 MED ORDER — PHENYLEPHRINE HCL 10 MG/ML IJ SOLN
INTRAMUSCULAR | Status: DC | PRN
Start: 2014-02-23 — End: 2014-02-23
  Administered 2014-02-23: 40 ug via INTRAVENOUS
  Administered 2014-02-23: 80 ug via INTRAVENOUS

## 2014-02-23 MED ORDER — DEXTROSE-NACL 5-0.45 % IV SOLN
INTRAVENOUS | Status: DC
  Administered 2014-02-23 – 2014-02-24 (×3): via INTRAVENOUS

## 2014-02-23 MED ORDER — SODIUM CHLORIDE 0.9 % IJ SOLN
INTRAMUSCULAR | Status: AC
  Filled 2014-02-23: qty 10

## 2014-02-23 MED ORDER — HYDROCODONE-ACETAMINOPHEN 5-325 MG PO TABS: 1.0000 | ORAL_TABLET | Freq: Four times a day (QID) | ORAL | Status: DC | PRN

## 2014-02-23 MED ORDER — HYDROCHLOROTHIAZIDE 12.5 MG PO CAPS
12.5000 mg | ORAL_CAPSULE | Freq: Every day | ORAL | Status: DC
  Administered 2014-02-24: 12.5 mg via ORAL
  Filled 2014-02-23: qty 1

## 2014-02-23 MED ORDER — CEFAZOLIN SODIUM-DEXTROSE 2-3 GM-% IV SOLR
INTRAVENOUS | Status: AC
  Filled 2014-02-23: qty 50

## 2014-02-23 MED ORDER — CISATRACURIUM BESYLATE 20 MG/10ML IV SOLN
INTRAVENOUS | Status: AC
  Filled 2014-02-23: qty 10

## 2014-02-23 MED ORDER — METOPROLOL TARTRATE 1 MG/ML IV SOLN
2.5000 mg | INTRAVENOUS | Status: DC | PRN
  Administered 2014-02-23: 2.5 mg via INTRAVENOUS

## 2014-02-23 MED ORDER — METOPROLOL TARTRATE 1 MG/ML IV SOLN
INTRAVENOUS | Status: AC
  Filled 2014-02-23: qty 5

## 2014-02-23 MED ORDER — ATORVASTATIN CALCIUM 40 MG PO TABS
40.0000 mg | ORAL_TABLET | Freq: Every day | ORAL | Status: DC
  Filled 2014-02-23 (×2): qty 1

## 2014-02-23 MED ORDER — LIDOCAINE HCL (CARDIAC) 20 MG/ML IV SOLN
INTRAVENOUS | Status: DC | PRN
  Administered 2014-02-23: 100 mg via INTRAVENOUS

## 2014-02-23 MED ORDER — ONDANSETRON HCL 4 MG/2ML IJ SOLN
INTRAMUSCULAR | Status: DC | PRN
  Administered 2014-02-23: 4 mg via INTRAVENOUS

## 2014-02-23 MED ORDER — SODIUM CHLORIDE 0.9 % IJ SOLN
INTRAMUSCULAR | Status: AC
  Filled 2014-02-23: qty 50

## 2014-02-23 MED ORDER — LACTATED RINGERS IR SOLN
Status: DC | PRN
  Administered 2014-02-23: 1000 mL

## 2014-02-23 MED ORDER — NEOSTIGMINE METHYLSULFATE 10 MG/10ML IV SOLN
INTRAVENOUS | Status: DC | PRN
  Administered 2014-02-23: 4 mg via INTRAVENOUS

## 2014-02-23 MED ORDER — SUFENTANIL CITRATE 50 MCG/ML IV SOLN
INTRAVENOUS | Status: AC
  Filled 2014-02-23: qty 1

## 2014-02-23 MED ORDER — GLYCOPYRROLATE 0.2 MG/ML IJ SOLN
INTRAMUSCULAR | Status: AC
  Filled 2014-02-23: qty 3

## 2014-02-23 MED ORDER — HYDROMORPHONE HCL PF 1 MG/ML IJ SOLN: 0.2500 mg | INTRAMUSCULAR | Status: DC | PRN

## 2014-02-23 MED ORDER — OXYCODONE HCL 5 MG/5ML PO SOLN
5.0000 mg | Freq: Once | ORAL | Status: DC | PRN
  Filled 2014-02-23: qty 5

## 2014-02-23 MED ORDER — OXYCODONE HCL 5 MG PO TABS: 5.0000 mg | ORAL_TABLET | Freq: Once | ORAL | Status: DC | PRN

## 2014-02-23 MED ORDER — CEFAZOLIN SODIUM-DEXTROSE 2-3 GM-% IV SOLR
2.0000 g | INTRAVENOUS | Status: AC
  Administered 2014-02-23: 2 g via INTRAVENOUS

## 2014-02-23 MED ORDER — PROPOFOL 10 MG/ML IV BOLUS
INTRAVENOUS | Status: DC | PRN
  Administered 2014-02-23: 170 mg via INTRAVENOUS

## 2014-02-23 MED ORDER — GLYCOPYRROLATE 0.2 MG/ML IJ SOLN
INTRAMUSCULAR | Status: DC | PRN
  Administered 2014-02-23: .6 mg via INTRAVENOUS

## 2014-02-23 MED ORDER — LIDOCAINE HCL (CARDIAC) 20 MG/ML IV SOLN
INTRAVENOUS | Status: AC
  Filled 2014-02-23: qty 5

## 2014-02-23 MED ORDER — SODIUM CHLORIDE 0.9 % IV BOLUS (SEPSIS)
1000.0000 mL | Freq: Once | INTRAVENOUS | Status: AC
  Administered 2014-02-23: 1000 mL via INTRAVENOUS

## 2014-02-23 MED ORDER — PHENYLEPHRINE HCL 10 MG/ML IJ SOLN
10.0000 mg | INTRAMUSCULAR | Status: DC | PRN
  Administered 2014-02-23: 100 ug/min via INTRAVENOUS

## 2014-02-23 MED ORDER — SUCCINYLCHOLINE CHLORIDE 20 MG/ML IJ SOLN
INTRAMUSCULAR | Status: DC | PRN
  Administered 2014-02-23: 100 mg via INTRAVENOUS

## 2014-02-23 MED ORDER — HYDROMORPHONE HCL PF 2 MG/ML IJ SOLN
INTRAMUSCULAR | Status: AC
  Filled 2014-02-23: qty 1

## 2014-02-23 MED ORDER — BENAZEPRIL-HYDROCHLOROTHIAZIDE 20-12.5 MG PO TABS: 1.0000 | ORAL_TABLET | Freq: Every morning | ORAL | Status: DC

## 2014-02-23 MED ORDER — ONDANSETRON HCL 4 MG/2ML IJ SOLN
4.0000 mg | INTRAMUSCULAR | Status: DC | PRN
  Administered 2014-02-23: 4 mg via INTRAVENOUS
  Filled 2014-02-23: qty 2

## 2014-02-23 MED ORDER — BENAZEPRIL HCL 20 MG PO TABS
20.0000 mg | ORAL_TABLET | Freq: Every day | ORAL | Status: DC
  Administered 2014-02-24: 20 mg via ORAL
  Filled 2014-02-23: qty 1

## 2014-02-23 MED ORDER — MEPERIDINE HCL 50 MG/ML IJ SOLN: 6.2500 mg | INTRAMUSCULAR | Status: DC | PRN

## 2014-02-23 MED ORDER — SUFENTANIL CITRATE 50 MCG/ML IV SOLN
INTRAVENOUS | Status: DC | PRN
  Administered 2014-02-23 (×2): 10 ug via INTRAVENOUS
  Administered 2014-02-23: 20 ug via INTRAVENOUS
  Administered 2014-02-23: 10 ug via INTRAVENOUS

## 2014-02-23 MED ORDER — SODIUM CHLORIDE 0.9 % IJ SOLN
INTRAMUSCULAR | Status: DC | PRN
  Administered 2014-02-23: 20 mL

## 2014-02-23 MED ORDER — EZETIMIBE 10 MG PO TABS
10.0000 mg | ORAL_TABLET | Freq: Every morning | ORAL | Status: DC
  Administered 2014-02-24: 10 mg via ORAL
  Filled 2014-02-23: qty 1

## 2014-02-23 MED ORDER — CIPROFLOXACIN HCL 500 MG PO TABS: 500.0000 mg | ORAL_TABLET | Freq: Two times a day (BID) | ORAL | Status: DC

## 2014-02-23 MED ORDER — DEXAMETHASONE SODIUM PHOSPHATE 10 MG/ML IJ SOLN
INTRAMUSCULAR | Status: AC
Start: 2014-02-23 — End: 2014-02-23
  Filled 2014-02-23: qty 1

## 2014-02-23 MED ORDER — BUPIVACAINE LIPOSOME 1.3 % IJ SUSP
20.0000 mL | Freq: Once | INTRAMUSCULAR | Status: AC
  Administered 2014-02-23: 20 mL
  Filled 2014-02-23: qty 20

## 2014-02-23 MED ORDER — KETOROLAC TROMETHAMINE 15 MG/ML IJ SOLN
15.0000 mg | Freq: Four times a day (QID) | INTRAMUSCULAR | Status: DC
  Administered 2014-02-23 – 2014-02-24 (×3): 15 mg via INTRAVENOUS
  Filled 2014-02-23 (×5): qty 1

## 2014-02-23 MED ORDER — NEOSTIGMINE METHYLSULFATE 10 MG/10ML IV SOLN
INTRAVENOUS | Status: AC
  Filled 2014-02-23: qty 1

## 2014-02-23 MED ORDER — MORPHINE SULFATE 2 MG/ML IJ SOLN
2.0000 mg | INTRAMUSCULAR | Status: DC | PRN
  Administered 2014-02-23: 2 mg via INTRAVENOUS
  Administered 2014-02-23: 4 mg via INTRAVENOUS
  Administered 2014-02-23: 2 mg via INTRAVENOUS
  Filled 2014-02-23: qty 2
  Filled 2014-02-23 (×2): qty 1

## 2014-02-23 MED ORDER — LACTATED RINGERS IV SOLN
INTRAVENOUS | Status: DC | PRN
  Administered 2014-02-23 (×3): via INTRAVENOUS

## 2014-02-23 MED ORDER — DEXAMETHASONE SODIUM PHOSPHATE 10 MG/ML IJ SOLN
INTRAMUSCULAR | Status: DC | PRN
  Administered 2014-02-23: 10 mg via INTRAVENOUS

## 2014-02-23 MED ORDER — HYDROMORPHONE HCL PF 1 MG/ML IJ SOLN
INTRAMUSCULAR | Status: DC | PRN
  Administered 2014-02-23 (×4): .4 mg via INTRAVENOUS

## 2014-02-23 MED ORDER — EPHEDRINE SULFATE 50 MG/ML IJ SOLN
INTRAMUSCULAR | Status: AC
  Filled 2014-02-23: qty 1

## 2014-02-23 SURGICAL SUPPLY — 54 items
ADH SKN CLS APL DERMABOND .7 (GAUZE/BANDAGES/DRESSINGS) ×2
CABLE HIGH FREQUENCY MONO STRZ (ELECTRODE) ×4 IMPLANT
CANISTER SUCTION 2500CC (MISCELLANEOUS) ×2 IMPLANT
CATH FOLEY 2WAY SLVR 18FR 30CC (CATHETERS) ×4 IMPLANT
CATH TIEMANN FOLEY 18FR 5CC (CATHETERS) ×4 IMPLANT
CHLORAPREP W/TINT 26ML (MISCELLANEOUS) ×4 IMPLANT
CLIP LIGATING HEM O LOK PURPLE (MISCELLANEOUS) ×8 IMPLANT
CLOTH BEACON ORANGE TIMEOUT ST (SAFETY) ×4 IMPLANT
COVER SURGICAL LIGHT HANDLE (MISCELLANEOUS) ×4 IMPLANT
COVER TIP SHEARS 8 DVNC (MISCELLANEOUS) ×2 IMPLANT
COVER TIP SHEARS 8MM DA VINCI (MISCELLANEOUS) ×2
CUTTER ECHEON FLEX ENDO 45 340 (ENDOMECHANICALS) ×4 IMPLANT
DECANTER SPIKE VIAL GLASS SM (MISCELLANEOUS) ×4 IMPLANT
DERMABOND ADVANCED (GAUZE/BANDAGES/DRESSINGS) ×2
DERMABOND ADVANCED .7 DNX12 (GAUZE/BANDAGES/DRESSINGS) ×2 IMPLANT
DRSG TEGADERM 4X4.75 (GAUZE/BANDAGES/DRESSINGS) ×8 IMPLANT
DRSG TEGADERM 6X8 (GAUZE/BANDAGES/DRESSINGS) ×8 IMPLANT
ELECT REM PT RETURN 9FT ADLT (ELECTROSURGICAL) ×4
ELECTRODE REM PT RTRN 9FT ADLT (ELECTROSURGICAL) ×2 IMPLANT
GAUZE SPONGE 2X2 8PLY STRL LF (GAUZE/BANDAGES/DRESSINGS) ×2 IMPLANT
GLOVE BIO SURGEON STRL SZ 6.5 (GLOVE) ×3 IMPLANT
GLOVE BIO SURGEONS STRL SZ 6.5 (GLOVE) ×1
GLOVE BIOGEL M STRL SZ7.5 (GLOVE) ×20 IMPLANT
GLOVE BIOGEL PI IND STRL 7.5 (GLOVE) IMPLANT
GLOVE BIOGEL PI INDICATOR 7.5 (GLOVE)
GOWN STRL REUS W/TWL LRG LVL4 (GOWN DISPOSABLE) ×20 IMPLANT
HOLDER FOLEY CATH W/STRAP (MISCELLANEOUS) ×4 IMPLANT
IV LACTATED RINGERS 1000ML (IV SOLUTION) IMPLANT
KIT ACCESSORY DA VINCI DISP (KITS) ×2
KIT ACCESSORY DVNC DISP (KITS) ×1 IMPLANT
KIT PROCEDURE DA VINCI SI (MISCELLANEOUS) ×2
KIT PROCEDURE DVNC SI (MISCELLANEOUS) ×2 IMPLANT
NDL INSUFFLATION 14GA 120MM (NEEDLE) ×2 IMPLANT
NEEDLE INSUFFLATION 14GA 120MM (NEEDLE) ×4 IMPLANT
NEEDLE SPNL 22GX7 SPINOC (NEEDLE) ×3 IMPLANT
PACK ROBOT UROLOGY CUSTOM (CUSTOM PROCEDURE TRAY) ×4 IMPLANT
RELOAD GREEN ECHELON 45 (STAPLE) ×4 IMPLANT
SET TUBE IRRIG SUCTION NO TIP (IRRIGATION / IRRIGATOR) ×4 IMPLANT
SOLUTION ELECTROLUBE (MISCELLANEOUS) ×4 IMPLANT
SPONGE GAUZE 2X2 STER 10/PKG (GAUZE/BANDAGES/DRESSINGS) ×2
SPONGE LAP 4X18 X RAY DECT (DISPOSABLE) ×4 IMPLANT
SUT ETHILON 3 0 PS 1 (SUTURE) ×4 IMPLANT
SUT MNCRL AB 4-0 PS2 18 (SUTURE) ×8 IMPLANT
SUT PDS AB 1 CT1 27 (SUTURE) ×8 IMPLANT
SUT VIC AB 2-0 SH 27 (SUTURE) ×4
SUT VIC AB 2-0 SH 27X BRD (SUTURE) ×2 IMPLANT
SUT VICRYL 0 UR6 27IN ABS (SUTURE) ×4 IMPLANT
SUT VLOC BARB 180 ABS3/0GR12 (SUTURE) ×12
SUTURE VLOC BRB 180 ABS3/0GR12 (SUTURE) ×6 IMPLANT
SYRINGE 1CC 27X.5 TB SAFETYGLD (MISCELLANEOUS) ×4 IMPLANT
TOWEL OR 17X26 10 PK STRL BLUE (TOWEL DISPOSABLE) ×4 IMPLANT
TOWEL OR NON WOVEN STRL DISP B (DISPOSABLE) ×4 IMPLANT
TROCAR 12M 150ML BLUNT (TROCAR) ×4 IMPLANT
WATER STERILE IRR 1500ML POUR (IV SOLUTION) ×8 IMPLANT

## 2014-02-23 NOTE — Transfer of Care (Signed)
Immediate Anesthesia Transfer of Care Note  Patient: Cory [name]  Procedure(s) Performed: Procedure(s): ROBOTIC ASSISTED LAPAROSCOPIC RADICAL PROSTATECTOMY WITH INDOCYANINE GREEN DYE INJECTION (N/A) PELVIC LYMPH NODE DISSECTION (Bilateral)  Patient Location: PACU  Anesthesia Type:General  Level of Consciousness: sedated  Airway & Oxygen Therapy: Patient Spontanous Breathing and Patient connected to face mask oxygen  Post-op Assessment: Report given to PACU RN and Post -op Vital signs reviewed and stable  Post vital signs: Reviewed and stable  Complications: No apparent anesthesia complications

## 2014-02-23 NOTE — Progress Notes (Signed)
Heart rate down to 83

## 2014-02-23 NOTE — Brief Op Note (Signed)
02/23/2014  12:19 PM  PATIENT:  Cory [name]  78 y.o. male  PRE-OPERATIVE DIAGNOSIS:  PROSTATE CANCER  POST-OPERATIVE DIAGNOSIS:  prostate cancer-  PROCEDURE:  Procedure(s): ROBOTIC ASSISTED LAPAROSCOPIC RADICAL PROSTATECTOMY WITH INDOCYANINE GREEN DYE INJECTION (N/A) PELVIC LYMPH NODE DISSECTION (Bilateral)  SURGEON:  Surgeon(s) and Role:    * Alexis Frock, MD - Primary  PHYSICIAN ASSISTANT:   ASSISTANTS: Felipa Furnace, PA. Amaryllis Dyke MD   ANESTHESIA:   general  EBL:  Total I/O In: 2000 [I.V.:2000] Out: 275 [Blood:275]  BLOOD ADMINISTERED:none  DRAINS: JP, foley   LOCAL MEDICATIONS USED:  MARCAINE     SPECIMEN:  Source of Specimen:  radical prostatectomy, bilateral pelvic lymph nodes  DISPOSITION OF SPECIMEN:  PATHOLOGY  COUNTS:  YES  TOURNIQUET:  * No tourniquets in log *  DICTATION: .Other Dictation: Dictation Number  [id number]  PLAN OF CARE: Admit to inpatient   PATIENT DISPOSITION:  PACU - hemodynamically stable.   Delay start of Pharmacological VTE agent (>24hrs) due to surgical blood loss or risk of bleeding: yes

## 2014-02-23 NOTE — Progress Notes (Signed)
Dr. Lissa Hoard  In to see  12 Lead EKG copy.

## 2014-02-23 NOTE — Progress Notes (Signed)
Metoprolol 2.5mg  IVP GIVEN AS ORDERED

## 2014-02-23 NOTE — Progress Notes (Signed)
12 Lead EKG done.

## 2014-02-23 NOTE — Anesthesia Procedure Notes (Signed)
Procedure Name: Intubation Date/Time: 02/23/2014 8:33 AM Performed by: Sharlette Dense Pre-anesthesia Checklist: Patient identified Patient Re-evaluated:Patient Re-evaluated prior to inductionOxygen Delivery Method: Circle system utilized Preoxygenation: Pre-oxygenation with 100% oxygen Intubation Type: IV induction Ventilation: Mask ventilation without difficulty and Oral airway inserted - appropriate to patient size Laryngoscope Size: Miller and 3 Grade View: Grade IV Tube type: Oral Tube size: 8.0 mm Number of attempts: 2 Airway Equipment and Method: Video-laryngoscopy Placement Confirmation: ETT inserted through vocal cords under direct vision,  positive ETCO2 and breath sounds checked- equal and bilateral Secured at: 22 cm Tube secured with: Tape Dental Injury: Teeth and Oropharynx as per pre-operative assessment  Difficulty Due To: Difficulty was unanticipated, Difficult Airway- due to reduced neck mobility and Difficult Airway- due to anterior larynx Future Recommendations: Recommend- induction with short-acting agent, and alternative techniques readily available Comments: Attempted laryngoscopy with Miller 3 and unable to visualize any airway structures except base of epiglottis.  Switched to Glidescope and intubated without difficulty.  Grade 1 view.

## 2014-02-23 NOTE — Progress Notes (Signed)
Hgb. And Hct. Drawn by lab. 

## 2014-02-23 NOTE — Progress Notes (Signed)
Hgb. - 15.1- Hct. 47.7- results noted.

## 2014-02-23 NOTE — Anesthesia Postprocedure Evaluation (Signed)
Anesthesia Post Note  Patient: Cory [name]  Procedure(s) Performed: Procedure(s) (LRB): ROBOTIC ASSISTED LAPAROSCOPIC RADICAL PROSTATECTOMY WITH INDOCYANINE GREEN DYE INJECTION (N/A) PELVIC LYMPH NODE DISSECTION (Bilateral)  Anesthesia type: General  Patient location: PACU  Post pain: Pain level controlled  Post assessment: Post-op Vital signs reviewed  Last Vitals: BP 127/79  Pulse 83  Temp(Src) 36.9 C (Oral)  Resp 13  Ht 5\' 8"  (1.727 m)  Wt 193 lb (87.544 kg)  BMI 29.35 kg/m2  SpO2 96%  Post vital signs: Reviewed  Level of consciousness: sedated  Complications: No apparent anesthesia complications

## 2014-02-23 NOTE — Progress Notes (Signed)
S:  S/p robotic prostatectomy today. Pain controlled. Had some arrythmia noted intra-op that was narrow-complex and without significant rate or pressure changes. Denies prior cardiac history or CP.  O: NAD AFVSS SNTND Serosanguinous efflux from JP Urine with thin blood mixed, no clots  A/P: Doing well POD 0. Will seek cards eval and 2D echo for his new although asymptomatic arrythmia. Encouraged to ambulate today.

## 2014-02-23 NOTE — H&P (Signed)
Cory [name] is an 78 y.o. male.    Chief Complaint: Pre-Op Radical Prostatectomy  HPI:   1 - Moderate Risk Prostate Cancer - Gleason 3+4=7 and 3+3=6 in 8/12 cores by prostate biopsy 12/2013 on eval PSA 8.57. Bilateral apex positive. CT and bone scan w/o advanced or distant disease. TRUS 90gm w/o median lobe. [facility] nomogram predicts 60% PFS 10 year surgery, 42% organ confined, 7% nodal 10% SV invasion.  2 - Lower Urinary Tract Symptoms - modest bother from baseline obstructive LUTS with nocturia x 3-4 and some weak stream / hesitancy. Currently no meds. Prostate 90gm by trus.   PMH sig for othro surgery, back surgery (no deficits). NO CV disease. NO strong blood thinners. His PCP is Cory Lyon MD with [name].   Today "Cory [name]" is seen to proceed with radical prostatectomy    Past Medical History  Diagnosis Date  . Hypertension 01/27/2012  . Prostate cancer   . Erythrocytosis   . Thalassemia trait     Past Surgical History  Procedure Laterality Date  . Knee arthroscopy      many yrs ago  . Back surgery  2007    No family history on file. Social History:  reports that he quit smoking about 35 years ago. He has never used smokeless tobacco. He reports that he drinks alcohol. He reports that he does not use illicit drugs.  Allergies: No Known Allergies  Medications Prior to Admission  Medication Sig Dispense Refill  . acetaminophen (TYLENOL) 500 MG tablet Take 500 mg by mouth every 6 (six) hours as needed for mild pain or moderate pain.      Marland Kitchen aspirin 81 MG tablet Take 81 mg by mouth daily.      Marland Kitchen atorvastatin (LIPITOR) 40 MG tablet Take 40 mg by mouth daily.      . benazepril-hydrochlorthiazide (LOTENSIN HCT) 20-12.5 MG per tablet Take 1 tablet by mouth every morning.      . cyclobenzaprine (FLEXERIL) 10 MG tablet Take 10 mg by mouth at bedtime as needed for muscle spasms.      . diazepam (VALIUM) 5 MG tablet Take 5 mg by mouth every 6 (six) hours as needed for muscle spasms.       . diclofenac (VOLTAREN) 75 MG EC tablet Take 75 mg by mouth daily as needed for mild pain or moderate pain.      Marland Kitchen ezetimibe (ZETIA) 10 MG tablet Take 10 mg by mouth every morning.      . Garlic 277 MG TABS Take 1 tablet by mouth daily.      . naproxen sodium (ANAPROX) 220 MG tablet Take 220 mg by mouth 2 (two) times daily as needed (for pain).      Vladimir Faster Glycol-Propyl Glycol (SYSTANE OP) Apply 1 drop to eye 4 (four) times daily as needed (dry eyes).      . tamsulosin (FLOMAX) 0.4 MG CAPS capsule Take 0.4 mg by mouth at bedtime.      . vardenafil (LEVITRA) 20 MG tablet Take 20 mg by mouth daily as needed for erectile dysfunction.        No results found for this or any previous visit (from the past 48 hour(s)). No results found.  Review of Systems  Constitutional: Negative.  Negative for fever and chills.  HENT: Negative.   Respiratory: Negative.   Cardiovascular: Negative.  Negative for chest pain.  Gastrointestinal: Negative.  Negative for nausea and vomiting.  Genitourinary: Negative.  Negative for hematuria.  Musculoskeletal:  Negative.   Skin: Negative.   Neurological: Negative.   Endo/Heme/Allergies: Negative.     Blood pressure 148/74, pulse 88, temperature 97.7 F (36.5 C), temperature source Oral, resp. rate 18, height 5\' 8"  (1.727 m), weight 87.544 kg (193 lb), SpO2 99.00%. Physical Exam  Constitutional: He is oriented to person, place, and time. He appears well-developed and well-nourished.  HENT:  Head: Normocephalic and atraumatic.  Eyes: EOM are normal.  Neck: Normal range of motion.  Cardiovascular: Normal rate.   Respiratory: Effort normal and breath sounds normal.  GI: Soft. Bowel sounds are normal.  Genitourinary: Penis normal.  Musculoskeletal: Normal range of motion.  Neurological: He is alert and oriented to person, place, and time.  Skin: Skin is warm and dry.  Psychiatric: He has a normal mood and affect. His behavior is normal. Judgment and  thought content normal.     Assessment/Plan  1 - Moderate Risk Prostate Cancer -  We rediscussed prostatectomy and specifically robotic prostatectomy with bilateral pelvic lymphadenectomy being the technique that I most commonly perform. I showed the patient on their abdomen the approximately 6 small incision (trocar) sites as well as presumed extraction sites with robotic approach as well as possible open incision sites should open conversion be necessary. We rediscussed peri-operative risks including bleeding, infection, deep vein thrombosis, pulmonary embolism, compartment syndrome, nuropathy / neuropraxia, heart attack, stroke, death, as well as long-term risks such as non-cure / need for additional therapy. We specifically readdressed that the procedure would compromise urinary control leading to stress incontinence which typically resolves with time and pelvic rehabilitation (Kegel's, etc..), but can sometimes be permanent and require additional therapy including surgery. We also specificallyre addressed sexual sequellae including significant erectile dysfunction which typically partially resolves with time but can also be permanent and require additional therapy including surgery.   We rediscussed the typical hospital course including usual 1-2 night hospitalization, discharge with foley catheter in place usually for 1-2 weeks before voiding trial as well as usually 2 week recovery until able to perform most non-strenuous activity and 6 weeks until able to return to most jobs and more strenuous activity such as exercise.   2 - Lower Urinary Tract Symptoms - likely due to prostate enlargement. Explained that he will likely trade this for stress incontinence in short term and he has very good understanding and realistic exceptions.   Cory [name] 02/23/2014, 6:31 AM

## 2014-02-23 NOTE — Anesthesia Preprocedure Evaluation (Signed)
Anesthesia Evaluation  Patient identified by MRN, date of birth, ID band Patient awake    Reviewed: Allergy & Precautions, H&P , NPO status , Patient's Chart, lab work & pertinent test results  Airway Mallampati: II TM Distance: >3 FB Neck ROM: Full    Dental no notable dental hx.    Pulmonary former smoker,  breath sounds clear to auscultation  Pulmonary exam normal       Cardiovascular hypertension, Pt. on medications Rhythm:Regular Rate:Normal     Neuro/Psych negative neurological ROS  negative psych ROS   GI/Hepatic negative GI ROS, Neg liver ROS,   Endo/Other  negative endocrine ROS  Renal/GU negative Renal ROS     Musculoskeletal negative musculoskeletal ROS (+)   Abdominal   Peds  Hematology  (+) anemia ,   Anesthesia Other Findings   Reproductive/Obstetrics negative OB ROS                           Anesthesia Physical Anesthesia Plan  ASA: II  Anesthesia Plan: General   Post-op Pain Management:    Induction: Intravenous  Airway Management Planned: Oral ETT  Additional Equipment:   Intra-op Plan:   Post-operative Plan: Extubation in OR  Informed Consent: I have reviewed the patients History and Physical, chart, labs and discussed the procedure including the risks, benefits and alternatives for the proposed anesthesia with the patient or authorized representative who has indicated his/her understanding and acceptance.   Dental advisory given  Plan Discussed with: CRNA  Anesthesia Plan Comments:         Anesthesia Quick Evaluation

## 2014-02-23 NOTE — Consult Note (Addendum)
CARDIOLOGY CONSULT NOTE   Patient ID: Cory [name] MRN: [id number] DOB/AGE: Oct 01, 1935 78 y.o.  Admit date: 02/23/2014  Primary Physician   [name], MD Primary Cardiologist   none Reason for Consultation   Arrythmia   [id number] [name] is a 78 y.o. male with history of HTN, erythroctosis, thalassemia trait and current prostate cancer who was admitted to the hospital today 8/26 for robotic prostatectomy today.   Some arrythmia noted intra-op that was narrow complex and without significant rate or pressure change.   [name] recalls a being referred for a possible exercise stress test in the early 1990s by Dr. Laurann Montana with [facility], but does not recall the results. He believes that he saw a cardiologist at that time, but has not seen one since. Mother died from a heart attack in her mid-late 56s. Denies any other cardiac history.  Denies history of SOB, CP, palpatations, tachycardia, syncope, dizziness, chest tightness/pressure.    Past Medical History  Diagnosis Date  . Hypertension 01/27/2012  . Prostate cancer   . Erythrocytosis   . Thalassemia trait   . Difficult intubation 02/23/2014    Glidescope for anterior airway     Past Surgical History  Procedure Laterality Date  . Knee arthroscopy      many yrs ago  . Back surgery  2007    No Known Allergies  I have reviewed the patient's current medications . atorvastatin  40 mg Oral q1800  . [START ON 02/24/2014] benazepril  20 mg Oral Daily   And  . [START ON 02/24/2014] hydrochlorothiazide  12.5 mg Oral Daily  . [START ON 02/24/2014] ezetimibe  10 mg Oral q morning - 10a  . ketorolac  15 mg Intravenous 4 times per day  . metoprolol      . metoprolol tartrate  12.5 mg Oral BID   . dextrose 5 % and 0.45% NaCl 125 mL/hr at 02/23/14 1338   acetaminophen, HYDROcodone-acetaminophen, morphine injection, ondansetron (ZOFRAN) IV  Prior to Admission medications   Medication Sig Start Date  End Date Taking? Authorizing Provider  atorvastatin (LIPITOR) 40 MG tablet Take 40 mg by mouth daily.   Yes Historical Provider, MD  benazepril-hydrochlorthiazide (LOTENSIN HCT) 20-12.5 MG per tablet Take 1 tablet by mouth every morning.   Yes Historical Provider, MD  ezetimibe (ZETIA) 10 MG tablet Take 10 mg by mouth every morning.   Yes Historical Provider, MD  tamsulosin (FLOMAX) 0.4 MG CAPS capsule Take 0.4 mg by mouth at bedtime.   Yes Historical Provider, MD  acetaminophen (TYLENOL) 500 MG tablet Take 500 mg by mouth every 6 (six) hours as needed for mild pain or moderate pain.    Historical Provider, MD  aspirin 81 MG tablet Take 81 mg by mouth daily.    Historical Provider, MD  ciprofloxacin (CIPRO) 500 MG tablet Take 1 tablet (500 mg total) by mouth 2 (two) times daily. Start day prior to office visit for foley removal 02/23/14   Marcie Bal, PA-C  cyclobenzaprine (FLEXERIL) 10 MG tablet Take 10 mg by mouth at bedtime as needed for muscle spasms.    Historical Provider, MD  diazepam (VALIUM) 5 MG tablet Take 5 mg by mouth every 6 (six) hours as needed for muscle spasms.    Historical Provider, MD  diclofenac (VOLTAREN) 75 MG EC tablet Take 75 mg by mouth daily as needed for mild pain or moderate pain.    Historical Provider, MD  Garlic 379 MG TABS Take  1 tablet by mouth daily.    Historical Provider, MD  HYDROcodone-acetaminophen (NORCO) 5-325 MG per tablet Take 1-2 tablets by mouth every 6 (six) hours as needed. 02/23/14   Marcie Bal, PA-C  naproxen sodium (ANAPROX) 220 MG tablet Take 220 mg by mouth 2 (two) times daily as needed (for pain).    Historical Provider, MD  Polyethyl Glycol-Propyl Glycol (SYSTANE OP) Apply 1 drop to eye 4 (four) times daily as needed (dry eyes).    Historical Provider, MD  vardenafil (LEVITRA) 20 MG tablet Take 20 mg by mouth daily as needed for erectile dysfunction.    Historical Provider, MD     History   Social History  . Marital Status:  Married    Spouse Name: N/A    Number of Children: N/A  . Years of Education: N/A   Occupational History  . Not on file.   Social History Main Topics  . Smoking status: Former Smoker    Quit date: 02/18/1979  . Smokeless tobacco: Never Used  . Alcohol Use: Yes     Comment: rare  . Drug Use: No  . Sexual Activity: Not on file   Other Topics Concern  . Not on file   Social History Narrative  . No narrative on file    No family history of premature CAD.    ROS:  Full 14 point review of systems complete and found to be negative unless listed above.  Physical Exam: Blood pressure 127/79, pulse 83, temperature 98.5 F (36.9 C), temperature source Oral, resp. rate 13, height 5\' 8"  (1.727 m), weight 193 lb (87.544 kg), SpO2 96.00%.  General: Well developed, well nourished, male in no acute distress Head: No xanthomas.   Normocephalic and atraumatic, oropharynx without edema or exudate. Lungs: Clear to auscultation throughout bilaterally.  Heart: HRRR S1 S2, no rub/gallop, Heart rate and rhythm with S1, S2. No murmur appreciated. pulses are 2+ extrem.   Neck: No carotid bruits. No lymphadenopathy.  JVD not elevated.  Abdomen: Bowel sounds present. Abdomen is soft. Msk:  No weakness, no joint deformities or effusions. Extremities: No clubbing or cyanosis.  No edema. SCDs present on LE bilaterally.  Neuro: Alert and oriented X 3. No focal deficits noted. Psych:  Good affect, responds appropriately Skin: No rashes or lesions noted.  Labs:   Lab Results  Component Value Date   WBC 3.1* 02/17/2014   HGB 15.1 02/23/2014   HCT 47.7 02/23/2014   MCV 76.5* 02/17/2014   PLT 159 02/17/2014    Recent Labs Lab 02/17/14 0945  NA 139  K 3.8  CL 104  CO2 24  BUN 16  CREATININE 1.05  CALCIUM 9.3  GLUCOSE 107*   ECG:  SR with occasionally PACs.   TELE: SR with occasional PACs.   Radiology:  No results found.  ASSESSMENT AND PLAN:   The patient was seen today by Dr. Ellyn Hack,  the patient evaluated and the data reviewed.  Active Problems:   Malignant neoplasm of prostate- s/p robotic prostatectomy today. Doing well. Per surgery/urology.   Arrythmia noted intra-operatively- EKG is reassuring. Tele is reassuring. Will continue to monitor on tele until discharge. Echo is ordered per surgery/ urology.   Signed:  Lucy Antigua PA-S2   Rosaria Ferries, PA-C 02/23/2014 5:07 PM Beeper [phone]  Co-Sign MD  I saw examined the patient along with Ms. Schott PA-S2 and Ms. Barrett P.A.-C. I agree with the findings, history, examination and recommendations.  Essentially rest a consult for  an arrhythmia that we don't have any rhythm strips of. With this am not sure what the appropriate treatment course would be.  He would sound as though he may have had a narrow complex tachycardia which could be sinus tachycardia versus SVT or atrial flutter.  Trying to get a printout from the OR strips would be helpful.  For now, moderate radiation we continue monitoring on telemetry this is any new arrhythmias. We'll also be looking out for an echocardiogram to surely also stable. Otherwise he seems very stable from cardiac standpoint. Her pressure room and heart rate room it would be potential for starting a beta blocker if there is a some type of arrhythmia.  We will followup on echocardiogram in a.m., and check his telemetry out tomorrow to see if is any further guidance.   Leonie Man, M.D., M.S. Interventional Cardiologist   Pager # [phone] 02/23/2014

## 2014-02-23 NOTE — Discharge Instructions (Signed)

## 2014-02-24 ENCOUNTER — Encounter (HOSPITAL_COMMUNITY): Payer: Self-pay | Admitting: Urology

## 2014-02-24 LAB — BASIC METABOLIC PANEL
Anion gap: 10 (ref 5–15)
BUN: 17 mg/dL (ref 6–23)
CHLORIDE: 102 meq/L (ref 96–112)
CO2: 25 meq/L (ref 19–32)
Calcium: 7.7 mg/dL — ABNORMAL LOW (ref 8.4–10.5)
Creatinine, Ser: 1.47 mg/dL — ABNORMAL HIGH (ref 0.50–1.35)
GFR calc Af Amer: 52 mL/min — ABNORMAL LOW (ref >90)
GFR calc non Af Amer: 45 mL/min — ABNORMAL LOW (ref >90)
Glucose, Bld: 152 mg/dL — ABNORMAL HIGH (ref 70–99)
Potassium: 4.3 mEq/L (ref 3.7–5.3)
SODIUM: 137 meq/L (ref 137–147)

## 2014-02-24 LAB — HEMOGLOBIN AND HEMATOCRIT, BLOOD
HCT: 40 % (ref 39.0–52.0)
HEMOGLOBIN: 13.2 g/dL (ref 13.0–17.0)

## 2014-02-24 LAB — CREATININE, FLUID (PLEURAL, PERITONEAL, JP DRAINAGE): CREAT FL: 1.3 mg/dL

## 2014-02-24 MED ORDER — METOPROLOL TARTRATE 12.5 MG HALF TABLET
12.5000 mg | ORAL_TABLET | Freq: Two times a day (BID) | ORAL | Status: DC
Start: 2014-02-24 — End: 2014-03-05

## 2014-02-24 NOTE — Progress Notes (Signed)
1 Day Post-Op Subjective: The patient is doing well.  No nausea or vomiting. Pain is adequately controlled on PO. Ambulated last night.  Objective: Vital signs in last 24 hours: Temp:  [98.2 F (36.8 C)-98.8 F (37.1 C)] 98.8 F (37.1 C) (08/27 0627) Pulse Rate:  [68-100] 79 (08/27 0627) Resp:  [8-20] 20 (08/27 0627) BP: (123-155)/(62-87) 123/66 mmHg (08/27 0627) SpO2:  [96 %-100 %] 97 % (08/27 0627)  Intake/Output from previous day: 08/26 0701 - 08/27 0700 In: 3697.9 [I.V.:2697.9; IV Piggyback:1000] Out: 9450 [Urine:1050; Drains:137; Blood:275] Intake/Output this shift: Total I/O In: -  Out: 862 [Urine:750; Drains:112]  Physical Exam:  General: Alert and oriented. CV: RRR Lungs: Clear bilaterally. GI: Soft, Nondistended. Incisions: Clean, dry, and intact Urine: Clear JP: Serosanguinous Extremities: Nontender, no erythema, no edema.  Lab Results:  Recent Labs  02/23/14 1250 02/24/14 0427  HGB 15.1 13.2  HCT 47.7 40.0      Assessment/Plan: POD# 1 s/p robotic prostatectomy.  1) SL IVF & regular diet 2) Ambulate, Incentive spirometry 3) Transition to oral pain medication 5) D/C drain later today 6) Plan for likely discharge later today

## 2014-02-24 NOTE — Discharge Summary (Signed)
Physician Discharge Summary  Patient ID: Orlandis Sanden MRN: [id number] DOB/AGE: 10-31-1935 78 y.o.  Admit date: 02/23/2014 Discharge date: 02/24/2014  Admission Diagnoses:  Discharge Diagnoses:  Active Problems:   Malignant neoplasm of prostate   Cardiac arrhythmia   Discharged Condition: good  Hospital Course:   1 - Moderate Risk Prostate Cancer - s/p robotic prostatectomy with bilateral templante + ICG sentinal lymphadenectomy 02/23/2014. Path pending at discharge. By 8/27, POD1, pt ambulatory, tollerating regular diet, pain controlled on PO meds JP removed as fluid Cr same as serum, and felt to be adequate for discharge.   2 - Narrow-Complex Cardiac Arrythmia - pt with asymptomatic narrox complex arrythmia noted intra-operatively. Cardiology consult 8/26 felt likley benign course and rec DC on low-dose betablocker withi plan for outpatient echocardiogram and follow-up.   Consults: cardiology  Significant Diagnostic Studies: labs: Hgb >12, pathology - pending  Treatments: surgery: robotic prostatectomy with bilateral templante + ICG sentinal lymphadenectomy 02/23/2014.  Discharge Exam: Blood pressure 103/58, pulse 76, temperature 99.2 F (37.3 C), temperature source Oral, resp. rate 18, height 5\' 8"  (1.727 m), weight 87.544 kg (193 lb), SpO2 99.00%. General appearance: alert, cooperative, appears stated age and family at bedside Head: Normocephalic, without obvious abnormality, atraumatic Nose: Nares normal. Septum midline. Mucosa normal. No drainage or sinus tenderness. Throat: lips, mucosa, and tongue normal; teeth and gums normal Back: symmetric, no curvature. ROM normal. No CVA tenderness. Resp: non-labored on room air Cardio: Nl rate GI: soft, non-tender; bowel sounds normal; no masses,  no organomegaly Male genitalia: normal, foley c/d/i with dark yellow urine, no clots. Extremities: extremities normal, atraumatic, no cyanosis or edema Pulses: 2+ and symmetric Skin:  Skin color, texture, turgor normal. No rashes or lesions Lymph nodes: Cervical, supraclavicular, and axillary nodes normal. Neurologic: Grossly normal Incision/Wound: Recent port sites and extraction sites c/d/i. JP removed and dry dressing applied.   Disposition:      Medication List    STOP taking these medications       aspirin 81 MG tablet     diclofenac 75 MG EC tablet  Commonly known as:  VOLTAREN     Garlic 974 MG Tabs     naproxen sodium 220 MG tablet  Commonly known as:  ANAPROX     tamsulosin 0.4 MG Caps capsule  Commonly known as:  FLOMAX     vardenafil 20 MG tablet  Commonly known as:  LEVITRA      TAKE these medications       acetaminophen 500 MG tablet  Commonly known as:  TYLENOL  Take 500 mg by mouth every 6 (six) hours as needed for mild pain or moderate pain.     atorvastatin 40 MG tablet  Commonly known as:  LIPITOR  Take 40 mg by mouth daily.     benazepril-hydrochlorthiazide 20-12.5 MG per tablet  Commonly known as:  LOTENSIN HCT  Take 1 tablet by mouth every morning.     ciprofloxacin 500 MG tablet  Commonly known as:  CIPRO  Take 1 tablet (500 mg total) by mouth 2 (two) times daily. Start day prior to office visit for foley removal     cyclobenzaprine 10 MG tablet  Commonly known as:  FLEXERIL  Take 10 mg by mouth at bedtime as needed for muscle spasms.     diazepam 5 MG tablet  Commonly known as:  VALIUM  Take 5 mg by mouth every 6 (six) hours as needed for muscle spasms.     ezetimibe 10 MG  tablet  Commonly known as:  ZETIA  Take 10 mg by mouth every morning.     HYDROcodone-acetaminophen 5-325 MG per tablet  Commonly known as:  NORCO  Take 1-2 tablets by mouth every 6 (six) hours as needed.     metoprolol tartrate 12.5 mg Tabs tablet  Commonly known as:  LOPRESSOR  Take 0.5 tablets (12.5 mg total) by mouth 2 (two) times daily. For heart rhythem until cardiology appointment.     SYSTANE OP  Apply 1 drop to eye 4 (four)  times daily as needed (dry eyes).           Follow-up Information   Follow up with Alexis Frock, MD On 03/03/2014. (9:00)    Specialty:  Urology   Contact information:   [place] [id number] [phone]       Signed: Alexis Frock 02/24/2014, 3:25 PM

## 2014-02-24 NOTE — Progress Notes (Signed)
     SUBJECTIVE: No complaints this am. No chest pain or SOB.   Tele: NO events overnight. Sinus  BP 123/66  Pulse 79  Temp(Src) 98.8 F (37.1 C) (Oral)  Resp 20  Ht 5\' 8"  (1.727 m)  Wt 193 lb (87.544 kg)  BMI 29.35 kg/m2  SpO2 97%  Intake/Output Summary (Last 24 hours) at 02/24/14 0720 Last data filed at 02/24/14 0600  Gross per 24 hour  Intake 3697.92 ml  Output   1462 ml  Net 2235.92 ml    PHYSICAL EXAM General: Well developed, well nourished, in no acute distress. Alert and oriented x 3.  Psych:  Good affect, responds appropriately Neck: No JVD. No masses noted.  Lungs: Clear bilaterally with no wheezes or rhonci noted.  Heart: RRR with no murmurs noted. Abdomen: Bowel sounds are present. Soft, non-tender.  Extremities: No lower extremity edema.   LABS: Basic Metabolic Panel:  Recent Labs  02/24/14 0427  NA 137  K 4.3  CL 102  CO2 25  GLUCOSE 152*  BUN 17  CREATININE 1.47*  CALCIUM 7.7*   CBC:  Recent Labs  02/23/14 1250 02/24/14 0427  HGB 15.1 13.2  HCT 47.7 40.0   Current Meds: . atorvastatin  40 mg Oral q1800  . benazepril  20 mg Oral Daily   And  . hydrochlorothiazide  12.5 mg Oral Daily  . ezetimibe  10 mg Oral q morning - 10a  . metoprolol tartrate  12.5 mg Oral BID     ASSESSMENT AND PLAN: 78 y.o. male with history of HTN and prostate cancer who was admitted to the hospital 8/26 for robotic prostatectomy. Arrythmia noted intra-op that was narrow complex and without significant rate or pressure change. No strips for review.   1. Narrow complex tachycardia: During surgery. No strips for review. Sinus tach vs atrial tach vs SVT. No recurrence overnight. Likely benign arrythmia. Continue low dose beta blocker. Echo was ordered by the surgical team and is pending this am. If his echo can be completed today then would proceed but would not hold up his discharge to get the echo. This could be done in our [place] office at time of f/u  with Dr. Ellyn Hack. OK for discharge from our standpoint today. Follow up with Dr. Ellyn Hack in the [facility] office in 3-4 weeks.     MCALHANY,CHRISTOPHER  8/27/20157:20 AM

## 2014-02-24 NOTE — Op Note (Signed)
Cory [name]              ACCOUNT NO.:  [id number]  MEDICAL RECORD NO.:  [id number]  LOCATION:  8185                         FACILITY:  [facility]  PHYSICIAN:  Alexis Frock, MD     DATE OF BIRTH:  [birth year]  DATE OF PROCEDURE:  02/23/2014 DATE OF DISCHARGE:                              OPERATIVE REPORT   DIAGNOSIS:  Moderate risk prostate cancer in very large prostate.  PROCEDURES: 1. Robotic-assisted laparoscopic radical prostatectomy with bilateral     pelvic lymphadenectomies. 2. Injection of indocyanine green dye for sentinel lymphangiography.  ESTIMATED BLOOD LOSS:  200 mL.  COMPLICATIONS:  None.  ASSESSMENT: 1. Radical prostatectomy. 2. Right external iliac lymph nodes. 3. Right obturator lymph nodes. 4. Left external iliac lymph nodes. 5. Left obturator lymph nodes. 6. Left obturator lymph node, sentinel.  DRAINS: 1. Jackson-Pratt drain to bulb suction. 2. Foley catheter to straight drain.  FINDINGS: 1. Large prostate. 2. Left inferolateral obturator sentinel lymph node (fluorescent), all other submitted packets     nonsentinel.  ASSISTANTS: 1. Leta Baptist, P.A. 2. Amaryllis Dyke, M.D.  INDICATIONS:  Mr. 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  He underwent staging evaluation that was negative for locally advanced disease.  He did have a very large prostate at least 90 g.  Options were discussed for primary therapy including radiation versus surgery versus ablative therapies versus surveillance protocols, and he wished to proceed with robotic prostatectomy.  Informed consent was obtained and placed in medical record.  PROCEDURE IN DETAIL:  The patient being Cory [name] was verified. Procedure being radical prostatectomy was confirmed.  Procedure was carried out.  Time-out was performed.  Intravenous antibiotics were administered.  General  endotracheal anesthesia was introduced.  The patient was placed into a low lithotomy position.  A sterile field was created by prepping and draping his penis, perineum, and proximal thighs using iodine x3 and his infraxiphoid abdomen using chlorhexidine gluconate.  He was placed in a steep Trendelenburg position and found to be suitably positioned.  A high-flow low-pressure pneumoperitoneum was obtained using Veress technique in the infraumbilical midline, having passed the aspiration and drop test.  Next, a 12-mm camera port was placed in the same location.  Laparoscopic examination of the peritoneal cavity revealed no significant adhesions and no visceral injury. Additional ports were placed as follows; right paramedian 8-mm robotic port, right far lateral 12-mm assistant port, left paramedian 8-mm robotic port, left far lateral 8-mm robotic port, right paramedian 5-mm assistant port.  Robot was docked and passed through electronic checks. Initial attention was directed to development of the space of Retzius. Incision was made lateral to the left medial umbilical ligament in the area of the midline towards the area of the internal ring and coursing along the course of the iliac vessels.  The vas deferens was encountered,  ligated and placed in gentle medial traction.  Dissection then proceeded lateral to the left bladder, sweeping away from the sidewall towards the area of the endopelvic fascia.  The mirror image dissection was then performed on the right side.  The anterior attachements taken down using  cautery scissors.  This exposed the anterior base of the prostate which was then defatted with this fatty tissue set aside, labeled periprostatic fat. Next, an 18-gauge Chiba needle was used to place 0.2 mL of indocyanine dye into each lobe of the prostate using a robotic technique with intervening suction to avoid spillage, which did not occur.  Next, The endopelvic fascia was carefully  swept away from the lateral aspect of the prostate in a base-to-apex orientation.  This exposed the dorsal venous complex, which was controlled using a vascular stapler.  It had been approximately 10 to 15 minutes post dye injection, and the pelvis was interrogated under near-infrared fluorescent light.  Sentinel lymphangiography revealed several lymphatic vessels coursing across the anterior surface of the prostate.  There were no hyperfluorescent lymph nodes in the right. On the left, there was a single hyperfluorescent node corresponding roughly to a very inferolateral obturator node.  Attention was directed to the lymphadenectomy on the right side.  First, all fibrofatty tissue within the confines of the right external iliac artery and vein, pelvic sidewall were carefully mobilized.  Lymphostasis was achieved with cold clips, set aside, labeled right external iliac lymph nodes.  Fibrofatty tissue within the confines of the right external iliac vein, pelvic sidewall, and obturator nerve were then carefully mobilized.  This was labeled right obturator lymph nodes.  Lymphostasis was again achieved with cold clips.  The obturator nerve inspected following these maneuvers and found to be uninjured.  A mirror image lymphadenectomy was then performed on the left side with the margins being exactly the same. The obturator nerve was inspected again on the left side.  At the inferolateral aspect of the obturator packet, there was a single hyperplastic lymph node, corresponding to likely ICG sentinel drainage. This set aside separately, labeled left obturator lymph node sentinel. Attention was then directed to the bladder neck dissection.  The bladder neck was identified by moving the Foley catheter back and forth. Dissection proceeded in an anterior to posterior direction at this location, keeping what appeared to be circular fibers of the bladder neck on each side and avoiding excessive  bladder neck caliber.  The posterior dissection was performed by incising approximately 7 mm inferior-posterior to the posterior lip of the prostate and dissecting directly posteriorly.  Bilateral vas deferens were encountered, dissected for distance of approximately 4 cm, ligated, and placed on gentle superior traction.  Bilateral seminal vesicles were also dissected to their tips and placed in gentle superior traction. Posterior dissection was then continued in a base-to-apex orientation, sweeping perirectal tissue away from the posterior aspect of the prostate, taking care to stay in the plane of Denonvilliers, exposed the vascular pedicles bilaterally, first on the right side.  The pedicles controlled using a sequential clipping technique in a base-to-apex orientation.  Similarly, the left pedicle was controlled.  Nerve sparing was not performed given the patient's age and moderate risk disease. Next, apical dissection was carefully performed by placing the prostate in gentle superior traction.  This exposed areas of the membranous urethra, which was coldly transected.  This completely freed the prostate and specimen was set aside for permanent pathology.  Digital rectal exam was performed using indicator glove and no rectal violation was noted.  Hemostasis appeared acceptable in the pelvis.  Posterior reconstruction was performed using a double-armed V-Loc suture, reapproximating the posterior bladder neck to the posterior urethral plate and mucosa.  The mucosa anastomosis was performed using double- armed V-Loc suture from the 12 o'clock  to 6 o'clock positions, which resulted in excellent mucosa-to-mucosa apposition.  New Foley catheter was placed per urethra which irrigated quantitatively.  Next, closed suction drain was brought through the previous left lateral most robotic port site into the peritoneal cavity.  The previous right 12- mm port was closed at the level of the fascia  using a Carter-Thomason suture passer.  Robot was then undocked.  Specimen retrieved by extending the previous camera port site for total distance of approximately 5 cm, removing the prostatectomy specimen and setting it aside for permanent pathology.  The extraction site was closed at the level of fascia using figure-of-eight PDS x4 followed by reapproximation of Scarpa's using running Vicryl.  All incisions were infiltrated with dilute lipolized Marcaine and closed at the level of skin using subcuticular Monocryl followed by Dermabond.  Procedure was then terminated.  The patient tolerated the procedure well.  There were no immediate periprocedural complications.  The patient was taken to the postanesthesia care unit in stable condition.          ______________________________ Alexis Frock, MD     TM/MEDQ  D:  02/23/2014  T:  02/23/2014  Job:  [id number]

## 2014-03-05 ENCOUNTER — Encounter (HOSPITAL_COMMUNITY): Payer: Self-pay | Admitting: Emergency Medicine

## 2014-03-05 ENCOUNTER — Emergency Department (HOSPITAL_COMMUNITY)
Admission: EM | Admit: 2014-03-05 | Discharge: 2014-03-05 | Disposition: A | Discharge status: Home / Self Care | Payer: Medicare Other | Attending: Emergency Medicine | Admitting: Emergency Medicine

## 2014-03-05 DIAGNOSIS — Z792 Long term (current) use of antibiotics: Secondary | ICD-10-CM | POA: Diagnosis not present

## 2014-03-05 DIAGNOSIS — Z7982 Long term (current) use of aspirin: Secondary | ICD-10-CM | POA: Insufficient documentation

## 2014-03-05 DIAGNOSIS — Z862 Personal history of diseases of the blood and blood-forming organs and certain disorders involving the immune mechanism: Secondary | ICD-10-CM | POA: Diagnosis not present

## 2014-03-05 DIAGNOSIS — Z79899 Other long term (current) drug therapy: Secondary | ICD-10-CM | POA: Insufficient documentation

## 2014-03-05 DIAGNOSIS — R109 Unspecified abdominal pain: Secondary | ICD-10-CM | POA: Insufficient documentation

## 2014-03-05 DIAGNOSIS — N390 Urinary tract infection, site not specified: Secondary | ICD-10-CM

## 2014-03-05 DIAGNOSIS — Z8546 Personal history of malignant neoplasm of prostate: Secondary | ICD-10-CM | POA: Diagnosis not present

## 2014-03-05 DIAGNOSIS — I10 Essential (primary) hypertension: Secondary | ICD-10-CM | POA: Diagnosis not present

## 2014-03-05 DIAGNOSIS — Z87891 Personal history of nicotine dependence: Secondary | ICD-10-CM | POA: Diagnosis not present

## 2014-03-05 DIAGNOSIS — R319 Hematuria, unspecified: Secondary | ICD-10-CM | POA: Diagnosis not present

## 2014-03-05 LAB — URINALYSIS, ROUTINE W REFLEX MICROSCOPIC
Glucose, UA: 100 mg/dL — AB
KETONES UR: 40 mg/dL — AB
NITRITE: POSITIVE — AB
PH: 5 (ref 5.0–8.0)
Protein, ur: >300 mg/dL — AB
SPECIFIC GRAVITY, URINE: 1.022 (ref 1.005–1.030)
UROBILINOGEN UA: 1 mg/dL (ref 0.0–1.0)

## 2014-03-05 LAB — CBC
HCT: 38.6 % — ABNORMAL LOW (ref 39.0–52.0)
HEMOGLOBIN: 12.7 g/dL — AB (ref 13.0–17.0)
MCH: 25.3 pg — AB (ref 26.0–34.0)
MCHC: 32.9 g/dL (ref 30.0–36.0)
MCV: 77 fL — ABNORMAL LOW (ref 78.0–100.0)
Platelets: 288 10*3/uL (ref 150–400)
RBC: 5.01 MIL/uL (ref 4.22–5.81)
RDW: 14.9 % (ref 11.5–15.5)
WBC: 6.8 10*3/uL (ref 4.0–10.5)

## 2014-03-05 LAB — BASIC METABOLIC PANEL
Anion gap: 13 (ref 5–15)
BUN: 20 mg/dL (ref 6–23)
CALCIUM: 9.1 mg/dL (ref 8.4–10.5)
CO2: 27 mEq/L (ref 19–32)
Chloride: 98 mEq/L (ref 96–112)
Creatinine, Ser: 1.08 mg/dL (ref 0.50–1.35)
GFR calc Af Amer: 76 mL/min — ABNORMAL LOW (ref >90)
GFR, EST NON AFRICAN AMERICAN: 65 mL/min — AB (ref >90)
Glucose, Bld: 115 mg/dL — ABNORMAL HIGH (ref 70–99)
Potassium: 3.5 mEq/L — ABNORMAL LOW (ref 3.7–5.3)
SODIUM: 138 meq/L (ref 137–147)

## 2014-03-05 LAB — URINE MICROSCOPIC-ADD ON

## 2014-03-05 MED ORDER — PHENAZOPYRIDINE HCL 95 MG PO TABS: 95.0000 mg | ORAL_TABLET | Freq: Three times a day (TID) | ORAL | Status: DC | PRN

## 2014-03-05 MED ORDER — SULFAMETHOXAZOLE-TMP DS 800-160 MG PO TABS
1.0000 | ORAL_TABLET | Freq: Once | ORAL | Status: AC
  Administered 2014-03-05: 1 via ORAL
  Filled 2014-03-05: qty 1

## 2014-03-05 MED ORDER — SULFAMETHOXAZOLE-TRIMETHOPRIM 800-160 MG PO TABS: 1.0000 | ORAL_TABLET | Freq: Two times a day (BID) | ORAL | Status: DC

## 2014-03-05 NOTE — ED Notes (Signed)
Per pt, states has prostate surgery and had cath removed on Thurs-no issues till yesterday-blood and painful urination

## 2014-03-05 NOTE — ED Notes (Signed)
He remains in no distress, and his wife remains with him.  I inform them that our doctor is calling the urologist on call and will be in shortly with plan.  They are visiting pleasantly and watching TV.

## 2014-03-05 NOTE — ED Provider Notes (Signed)
CSN: [id number]     Arrival date & time 03/05/14  0723 History   First MD Initiated Contact with Patient 03/05/14 0732     Chief Complaint  Patient presents with  . Hematuria     (Consider location/radiation/quality/duration/timing/severity/associated sxs/prior Treatment) HPI Comments: Per pt, states has prostate surgery 8/26, and had cath removed on Thurs (2 days ago)-no issues till midnight-blood and painful urination  Patient is a 78 y.o. male presenting with hematuria. The history is provided by the patient. No language interpreter was used.  Hematuria This is a new problem. The current episode started 12 to 24 hours ago. The problem occurs constantly. The problem has been gradually improving. Associated symptoms include abdominal pain (suprapubic). Pertinent negatives include no chest pain, no headaches and no shortness of breath. Exacerbated by: urinating. Nothing relieves the symptoms. He has tried nothing for the symptoms. The treatment provided no relief.    Past Medical History  Diagnosis Date  . Hypertension 01/27/2012  . Prostate cancer   . Erythrocytosis   . Thalassemia trait   . 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 Knee arthroscopy      many yrs ago  . Back surgery  2007  . Robot assisted laparoscopic radical prostatectomy N/A 02/23/2014    Procedure: ROBOTIC ASSISTED LAPAROSCOPIC RADICAL PROSTATECTOMY WITH INDOCYANINE GREEN DYE INJECTION;  Surgeon: Alexis Frock, MD;  Location: [facility];  Service: Urology;  Laterality: N/A;  . Lymphadenectomy Bilateral 02/23/2014    Procedure: PELVIC LYMPH NODE DISSECTION;  Surgeon: Alexis Frock, MD;  Location: [facility];  Service: Urology;  Laterality: Bilateral;   Family History  Problem Relation Age of Onset  . Heart attack Mother 80   History  Substance Use Topics  . Smoking status: Former Smoker    Quit date: 02/18/1979  . Smokeless tobacco:  Never Used  . Alcohol Use: Yes     Comment: rare    Review of Systems  Constitutional: Negative for fever, activity change, appetite change and fatigue.  HENT: Negative for congestion, facial swelling, rhinorrhea and trouble swallowing.   Eyes: Negative for photophobia and pain.  Respiratory: Negative for cough, chest tightness and shortness of breath.   Cardiovascular: Negative for chest pain and leg swelling.  Gastrointestinal: Positive for abdominal pain (suprapubic). Negative for nausea, vomiting, diarrhea and constipation.  Endocrine: Negative for polydipsia and polyuria.  Genitourinary: Positive for dysuria and hematuria. Negative for urgency, decreased urine volume and difficulty urinating.  Musculoskeletal: Negative for back pain and gait problem.  Skin: Negative for color change, rash and wound.  Allergic/Immunologic: Negative for immunocompromised state.  Neurological: Negative for dizziness, facial asymmetry, speech difficulty, weakness, numbness and headaches.  Psychiatric/Behavioral: Negative for confusion, decreased concentration and agitation.      Allergies  Review of patient's allergies indicates no known allergies.  Home Medications   Prior to Admission medications   Medication Sig Start Date End Date Taking? Authorizing Provider  aspirin EC 81 MG tablet Take 81 mg by mouth daily.   Yes Historical Provider, MD  atorvastatin (LIPITOR) 40 MG tablet Take 40 mg by mouth daily.   Yes Historical Provider, MD  benazepril-hydrochlorthiazide (LOTENSIN HCT) 20-12.5 MG per tablet Take 1 tablet by mouth every morning.   Yes Historical Provider, MD  ciprofloxacin (CIPRO) 500 MG tablet Take 1 tablet (500 mg total) by mouth 2 (two) times daily. Start day prior to office visit for foley removal 02/23/14  Yes Quillian Quince, PA-C  ezetimibe (ZETIA) 10 MG tablet Take 10 mg by mouth every morning.   Yes Historical Provider, MD  HYDROcodone-acetaminophen (NORCO) 5-325 MG per tablet  Take 1-2 tablets by mouth every 6 (six) hours as needed. 02/23/14  Yes Dewayne Hatch Dancy, PA-C  phenazopyridine (PYRIDIUM) 95 MG tablet Take 1 tablet (95 mg total) by mouth 3 (three) times daily as needed for pain. 03/05/14   Ernestina Patches, MD  sulfamethoxazole-trimethoprim (SEPTRA DS) 800-160 MG per tablet Take 1 tablet by mouth 2 (two) times daily. For 7 days 03/05/14   Ernestina Patches, MD   BP 139/73  Pulse 92  Temp(Src) 97.8 F (36.6 C) (Oral)  Resp 16  SpO2 99% Physical Exam  Constitutional: He is oriented to person, place, and time. He appears well-developed and well-nourished. No distress.  HENT:  Head: Normocephalic and atraumatic.  Mouth/Throat: No oropharyngeal exudate.  Eyes: Pupils are equal, round, and reactive to light.  Neck: Normal range of motion. Neck supple.  Cardiovascular: Normal rate, regular rhythm and normal heart sounds.  Exam reveals no gallop and no friction rub.   No murmur heard. Pulmonary/Chest: Effort normal and breath sounds normal. No respiratory distress. He has no wheezes. He has no rales.  Abdominal: Soft. Bowel sounds are normal. He exhibits no distension and no mass. There is tenderness in the suprapubic area. There is no rigidity, no rebound and no guarding.  Trocar sites C/D/I  Musculoskeletal: Normal range of motion. He exhibits no edema and no tenderness.  Neurological: He is alert and oriented to person, place, and time.  Skin: Skin is warm and dry.  Psychiatric: He has a normal mood and affect.    ED Course  Procedures (including critical care time) Labs Review Labs Reviewed  URINALYSIS, ROUTINE W REFLEX MICROSCOPIC - Abnormal; Notable for the following:    Color, Urine RED (*)    APPearance TURBID (*)    Glucose, UA 100 (*)    Hgb urine dipstick LARGE (*)    Bilirubin Urine LARGE (*)    Ketones, ur 40 (*)    Protein, ur >300 (*)    Nitrite POSITIVE (*)    Leukocytes, UA LARGE (*)    All other components within normal limits  CBC -  Abnormal; Notable for the following:    Hemoglobin 12.7 (*)    HCT 38.6 (*)    MCV 77.0 (*)    MCH 25.3 (*)    All other components within normal limits  BASIC METABOLIC PANEL - Abnormal; Notable for the following:    Potassium 3.5 (*)    Glucose, Bld 115 (*)    GFR calc non Af Amer 65 (*)    GFR calc Af Amer 76 (*)    All other components within normal limits  URINE MICROSCOPIC-ADD ON - Abnormal; Notable for the following:    Bacteria, UA MANY (*)    All other components within normal limits    Imaging Review No results found.   EKG Interpretation None      MDM   Final diagnoses:  Hematuria  UTI (lower urinary tract infection)    Pt is a 78 y.o. male with Pmhx as above who presents with dysuria, hematuria, and sensation of incomplete emptying since around 8 hrs ago. He had robotic prostatectomy 8/26 with foley removal 2 days ago. Denies n/v, fever, chills, passage of clots. On PE, VSS, pt in NAD. Surgical trocar sites C/D/I. Mild suprapubic ttp. UA + for  nitrites and leukocytes. Post-void residual 200cc. Cr nml. Spoke w/ Dr. Junious Silk with urology, he does not rec placing foley at this point, but would treat for UTI. Will start on bactrim, and given short course of AZO.  Return precautions given for new or worsening symptoms including inability to urinate, worsening pain, fever,           Ernestina Patches, MD 03/05/14 1009

## 2014-03-05 NOTE — Discharge Instructions (Signed)
Radical Prostatectomy, Care After Refer to this sheet in the next few weeks. These instructions provide you with information on caring for yourself after your procedure. Your caregiver also may give you more specific instructions. Your treatment has been planned according to current medical practices, but problems sometimes occur. Call your caregiver if you have any problems or questions after your procedure. [place] When you leave the hospital, a thin, flexible tube for draining urine (urinary catheter) will extend from your bladder, through your penis, to a collection bag outside your body. The catheter will drain urine from your bladder. Your caregiver will tell you how to remove the collection bag when it is full and how to attach a new bag. Your caregiver will also tell you how to fasten the catheter to your leg. You should always keep the collection bag below your bladder. You will have a large bag to use at night and a small bag to use during the day. The small bag should be emptied about every 3 hours. You should clean the tip of your penis (where the catheter comes out) with soap and water at least 2 times a day. Your caregiver also may give you ointment to put on the tip. You will have the catheter until healing is complete (2-3 weeks). You will need to return to your caregiver to have the catheter removed. You may have cramping in your bladder or feel the need to urinate often (bladder spasm). This is common when a catheter is in place. You may even leak a little urine or blood when the catheter is in place. This is normal. It often happens after a bladder spasm. Once the catheter has been removed, control of urination will come back slowly. You may need to wear a pad to absorb any leakage for a little while after the catheter is removed. Incision Care You may have bandages over the incisions on your belly. Your caregiver will tell you when the bandages can be removed  (typically, within a few days). Check the incisions every day for redness, pain, and swelling (inflammation), which are signs of infection. Use of Medicine Some pain is normal after this procedure. You may also have difficulty having bowel movements (constipation). Your caregiver may prescribe the use of pain medicine for the pain or laxatives or stool softeners for the constipation. However, only take over-the-counter and prescription medicine as your caregiver directs. Diet You may resume a normal diet right away. However, if you are constipated, drinking 6-8 glasses of liquid every day and including prune juice and high-fiber foods in your diet can help. Activities You will probably be able to go back to most activities in about 10 days. It may take a month before you can do everything you used to do. Get plenty of rest and do not participate in any strenuous activity. Do not lift anything heavy until your caregiver says it is okay. However, walking after an operation is important. Try to walk for a few minutes at least 6 times a day. When sitting down, try to first press your toes into the floor and then relax them as you are seated. This will help you maintain the circulation through the blood vessels in your legs and help to prevent any blood clots. Shower rather than bathe until you are fully healed. Before showering, remove your catheter from the collection bag while in the shower. Ask your caregiver when it is okay to resume taking baths.  Some  men can return to sexual activity 2 weeks after the procedure. Make sure you talk to your caregiver before you resume sexual activity. SEEK MEDICAL CARE IF:   You have problems with your catheter.  You develop frequent bladder spasms.  You become constipated.  You develop a fever. SEEK IMMEDIATE MEDICAL CARE IF:   Your pain and swelling get worse.  Your catheter becomes blocked or falls out.  You notice bright red blood or clots in your  urine.  You have inflammation around your incisions.  Your incisions open.  Fluid or blood starts to leak from incisions after they have healed. MAKE SURE YOU:   Understand these instructions.  Will watch your condition.  Will get help right away if you are not doing well or get worse. Document Released: 03/11/2012 Document Revised: 11/01/2013 Document Reviewed: 03/11/2012 [facility] Patient Information 2015 [place], [place]. This information is not intended to replace advice given to you by your health care provider. Make sure you discuss any questions you have with your health care provider. Hematuria Hematuria is blood in your urine. It can be caused by a bladder infection, kidney infection, prostate infection, kidney stone, or cancer of your urinary tract. Infections can usually be treated with medicine, and a kidney stone usually will pass through your urine. If neither of these is the cause of your hematuria, further workup to find out the reason may be needed. It is very important that you tell your health care provider about any blood you see in your urine, even if the blood stops without treatment or happens without causing pain. Blood in your urine that happens and then stops and then happens again can be a symptom of a very serious condition. Also, pain is not a symptom in the initial stages of many urinary cancers. HOME CARE INSTRUCTIONS   Drink lots of fluid, 3-4 quarts a day. If you have been diagnosed with an infection, cranberry juice is especially recommended, in addition to large amounts of water.  Avoid caffeine, tea, and carbonated beverages because they tend to irritate the bladder.  Avoid alcohol because it may irritate the prostate.  Take all medicines as directed by your health care provider.  If you were prescribed an antibiotic medicine, finish it all even if you start to feel better.  If you have been diagnosed with a kidney stone, follow your health care  provider's instructions regarding straining your urine to catch the stone.  Empty your bladder often. Avoid holding urine for long periods of time.  After a bowel movement, women should cleanse front to back. Use each tissue only once.  Empty your bladder before and after sexual intercourse if you are a male. SEEK MEDICAL CARE IF:  You develop back pain.  You have a fever.  You have a feeling of sickness in your stomach (nausea) or vomiting.  Your symptoms are not better in 3 days. Return sooner if you are getting worse. SEEK IMMEDIATE MEDICAL CARE IF:   You develop severe vomiting and are unable to keep the medicine down.  You develop severe back or abdominal pain despite taking your medicines.  You begin passing a large amount of blood or clots in your urine.  You feel extremely weak or faint, or you pass out. MAKE SURE YOU:   Understand these instructions.  Will watch your condition.  Will get help right away if you are not doing well or get worse. Document Released: 06/17/2005 Document Revised: 11/01/2013 Document Reviewed: 02/15/2013 [employer]  Patient Information 2015 [place]. This information is not intended to replace advice given to you by your health care provider. Make sure you discuss any questions you have with your health care provider.

## 2014-03-09 ENCOUNTER — Encounter (HOSPITAL_COMMUNITY): Payer: Self-pay | Admitting: Emergency Medicine

## 2014-03-09 ENCOUNTER — Emergency Department (HOSPITAL_COMMUNITY)
Admission: EM | Admit: 2014-03-09 | Discharge: 2014-03-09 | Disposition: A | Discharge status: Home / Self Care | Payer: Medicare Other | Attending: Emergency Medicine | Admitting: Emergency Medicine

## 2014-03-09 DIAGNOSIS — Z8546 Personal history of malignant neoplasm of prostate: Secondary | ICD-10-CM | POA: Insufficient documentation

## 2014-03-09 DIAGNOSIS — R339 Retention of urine, unspecified: Secondary | ICD-10-CM

## 2014-03-09 DIAGNOSIS — Z862 Personal history of diseases of the blood and blood-forming organs and certain disorders involving the immune mechanism: Secondary | ICD-10-CM | POA: Diagnosis not present

## 2014-03-09 DIAGNOSIS — Z87891 Personal history of nicotine dependence: Secondary | ICD-10-CM | POA: Diagnosis not present

## 2014-03-09 DIAGNOSIS — Z9089 Acquired absence of other organs: Secondary | ICD-10-CM | POA: Diagnosis not present

## 2014-03-09 DIAGNOSIS — I10 Essential (primary) hypertension: Secondary | ICD-10-CM | POA: Diagnosis not present

## 2014-03-09 DIAGNOSIS — Z792 Long term (current) use of antibiotics: Secondary | ICD-10-CM | POA: Insufficient documentation

## 2014-03-09 DIAGNOSIS — Z79899 Other long term (current) drug therapy: Secondary | ICD-10-CM | POA: Diagnosis not present

## 2014-03-09 DIAGNOSIS — Z7982 Long term (current) use of aspirin: Secondary | ICD-10-CM | POA: Diagnosis not present

## 2014-03-09 DIAGNOSIS — R350 Frequency of micturition: Secondary | ICD-10-CM | POA: Insufficient documentation

## 2014-03-09 NOTE — ED Notes (Signed)
Pt had catheter removed on the 5th and states that it hurts to urinate and feels like he can't urinate.

## 2014-03-09 NOTE — ED Provider Notes (Signed)
CSN: [id number]     Arrival date & time 03/09/14  0536 History   First MD Initiated Contact with Patient 03/09/14 0539     Chief Complaint  Patient presents with  . Urinary Retention      HPI Patient presents emergency department complaining of urinary retention.  Patient underwent radical prostatectomy on 02/23/2014.  This catheter was removed last week and he was doing well initially however he developed a urinary tract infection approximately 4 days ago.  Since then he is placed on Bactrim as developed recurrent urinary retention.  No fevers or chills.  Denies nausea vomiting.  Lower abdominal pressure is resolved now the nursing staff has placed a catheter.  Symptoms are mild to moderate in severity   Past Medical History  Diagnosis Date  . Hypertension 01/27/2012  . Prostate cancer   . Erythrocytosis   . Thalassemia trait   . 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 Knee arthroscopy      many yrs ago  . Back surgery  2007  . Robot assisted laparoscopic radical prostatectomy N/A 02/23/2014    Procedure: ROBOTIC ASSISTED LAPAROSCOPIC RADICAL PROSTATECTOMY WITH INDOCYANINE GREEN DYE INJECTION;  Surgeon: Alexis Frock, MD;  Location: [facility];  Service: Urology;  Laterality: N/A;  . Lymphadenectomy Bilateral 02/23/2014    Procedure: PELVIC LYMPH NODE DISSECTION;  Surgeon: Alexis Frock, MD;  Location: [facility];  Service: Urology;  Laterality: Bilateral;   Family History  Problem Relation Age of Onset  . Heart attack Mother 69   History  Substance Use Topics  . Smoking status: Former Smoker    Quit date: 02/18/1979  . Smokeless tobacco: Never Used  . Alcohol Use: Yes     Comment: rare    Review of Systems  All other systems reviewed and are negative.     Allergies  Review of patient's allergies indicates no known allergies.  Home Medications   Prior to Admission medications   Medication  Sig Start Date End Date Taking? Authorizing Provider  aspirin EC 81 MG tablet Take 81 mg by mouth daily.    Historical Provider, MD  atorvastatin (LIPITOR) 40 MG tablet Take 40 mg by mouth daily.    Historical Provider, MD  benazepril-hydrochlorthiazide (LOTENSIN HCT) 20-12.5 MG per tablet Take 1 tablet by mouth every morning.    Historical Provider, MD  ciprofloxacin (CIPRO) 500 MG tablet Take 1 tablet (500 mg total) by mouth 2 (two) times daily. Start day prior to office visit for foley removal 02/23/14   Marilynn Rail Dancey, PA-C  ezetimibe (ZETIA) 10 MG tablet Take 10 mg by mouth every morning.    Historical Provider, MD  HYDROcodone-acetaminophen (NORCO) 5-325 MG per tablet Take 1-2 tablets by mouth every 6 (six) hours as needed. 02/23/14   Konrad Saha, PA-C  phenazopyridine (PYRIDIUM) 95 MG tablet Take 1 tablet (95 mg total) by mouth 3 (three) times daily as needed for pain. 03/05/14   Ernestina Patches, MD  sulfamethoxazole-trimethoprim (SEPTRA DS) 800-160 MG per tablet Take 1 tablet by mouth 2 (two) times daily. For 7 days 03/05/14   Ernestina Patches, MD   BP 197/88  Pulse 133  Resp 20  SpO2 100% Physical Exam  Nursing note and vitals reviewed. Constitutional: He is oriented to person, place, and time. He appears well-developed and well-nourished.  HENT:  Head: Normocephalic and atraumatic.  Eyes: EOM are normal.  Neck: Normal range of  motion.  Cardiovascular: Normal rate, regular rhythm, normal heart sounds and intact distal pulses.   Pulmonary/Chest: Effort normal and breath sounds normal. No respiratory distress.  Abdominal: Soft. He exhibits no distension. There is no tenderness.  Musculoskeletal: Normal range of motion.  Neurological: He is alert and oriented to person, place, and time.  Skin: Skin is warm and dry.  Psychiatric: He has a normal mood and affect. Judgment normal.    ED Course  Procedures (including critical care time) Labs Review Labs Reviewed  URINE CULTURE     Imaging Review No results found.   EKG Interpretation None      MDM   Final diagnoses:  Urinary retention    Acute urinary retention.  Resolved with Foley catheter placement.  Outpatient neurology followup.  Abdomen benign.  Nontoxic.  Heart rate resolved after treatment with Foley placement    Hoy Morn, MD 03/09/14 [phone]

## 2014-03-10 LAB — URINE CULTURE
COLONY COUNT: NO GROWTH
Culture: NO GROWTH

## 2014-03-23 DIAGNOSIS — M6281 Muscle weakness (generalized): Secondary | ICD-10-CM | POA: Diagnosis not present

## 2014-03-23 DIAGNOSIS — N393 Stress incontinence (female) (male): Secondary | ICD-10-CM | POA: Diagnosis not present

## 2014-03-23 DIAGNOSIS — R279 Unspecified lack of coordination: Secondary | ICD-10-CM | POA: Diagnosis not present

## 2014-03-23 DIAGNOSIS — R3915 Urgency of urination: Secondary | ICD-10-CM | POA: Diagnosis not present

## 2014-04-06 DIAGNOSIS — R278 Other lack of coordination: Secondary | ICD-10-CM | POA: Diagnosis not present

## 2014-04-06 DIAGNOSIS — M6281 Muscle weakness (generalized): Secondary | ICD-10-CM | POA: Diagnosis not present

## 2014-04-06 DIAGNOSIS — C61 Malignant neoplasm of prostate: Secondary | ICD-10-CM | POA: Diagnosis not present

## 2014-04-06 DIAGNOSIS — N393 Stress incontinence (female) (male): Secondary | ICD-10-CM | POA: Diagnosis not present

## 2014-04-19 DIAGNOSIS — C61 Malignant neoplasm of prostate: Secondary | ICD-10-CM | POA: Diagnosis not present

## 2014-04-19 DIAGNOSIS — N393 Stress incontinence (female) (male): Secondary | ICD-10-CM | POA: Diagnosis not present

## 2014-04-19 DIAGNOSIS — R3915 Urgency of urination: Secondary | ICD-10-CM | POA: Diagnosis not present

## 2014-04-26 DIAGNOSIS — R278 Other lack of coordination: Secondary | ICD-10-CM | POA: Diagnosis not present

## 2014-04-26 DIAGNOSIS — M6281 Muscle weakness (generalized): Secondary | ICD-10-CM | POA: Diagnosis not present

## 2014-04-26 DIAGNOSIS — M62838 Other muscle spasm: Secondary | ICD-10-CM | POA: Diagnosis not present

## 2014-04-26 DIAGNOSIS — N393 Stress incontinence (female) (male): Secondary | ICD-10-CM | POA: Diagnosis not present

## 2014-05-10 DIAGNOSIS — M6281 Muscle weakness (generalized): Secondary | ICD-10-CM | POA: Diagnosis not present

## 2014-05-10 DIAGNOSIS — R278 Other lack of coordination: Secondary | ICD-10-CM | POA: Diagnosis not present

## 2014-05-10 DIAGNOSIS — N393 Stress incontinence (female) (male): Secondary | ICD-10-CM | POA: Diagnosis not present

## 2014-05-10 DIAGNOSIS — M62838 Other muscle spasm: Secondary | ICD-10-CM | POA: Diagnosis not present

## 2014-05-18 DIAGNOSIS — R278 Other lack of coordination: Secondary | ICD-10-CM | POA: Diagnosis not present

## 2014-05-18 DIAGNOSIS — M62838 Other muscle spasm: Secondary | ICD-10-CM | POA: Diagnosis not present

## 2014-05-18 DIAGNOSIS — M6281 Muscle weakness (generalized): Secondary | ICD-10-CM | POA: Diagnosis not present

## 2014-05-18 DIAGNOSIS — N393 Stress incontinence (female) (male): Secondary | ICD-10-CM | POA: Diagnosis not present

## 2014-06-08 DIAGNOSIS — N393 Stress incontinence (female) (male): Secondary | ICD-10-CM | POA: Diagnosis not present

## 2014-06-08 DIAGNOSIS — M6281 Muscle weakness (generalized): Secondary | ICD-10-CM | POA: Diagnosis not present

## 2014-06-08 DIAGNOSIS — R278 Other lack of coordination: Secondary | ICD-10-CM | POA: Diagnosis not present

## 2014-06-20 DIAGNOSIS — N393 Stress incontinence (female) (male): Secondary | ICD-10-CM | POA: Diagnosis not present

## 2014-06-20 DIAGNOSIS — R278 Other lack of coordination: Secondary | ICD-10-CM | POA: Diagnosis not present

## 2014-06-20 DIAGNOSIS — M6281 Muscle weakness (generalized): Secondary | ICD-10-CM | POA: Diagnosis not present

## 2014-07-13 DIAGNOSIS — R278 Other lack of coordination: Secondary | ICD-10-CM | POA: Diagnosis not present

## 2014-07-13 DIAGNOSIS — N393 Stress incontinence (female) (male): Secondary | ICD-10-CM | POA: Diagnosis not present

## 2014-07-13 DIAGNOSIS — M6281 Muscle weakness (generalized): Secondary | ICD-10-CM | POA: Diagnosis not present

## 2014-07-13 DIAGNOSIS — C61 Malignant neoplasm of prostate: Secondary | ICD-10-CM | POA: Diagnosis not present

## 2014-07-13 DIAGNOSIS — M62838 Other muscle spasm: Secondary | ICD-10-CM | POA: Diagnosis not present

## 2014-07-21 DIAGNOSIS — N393 Stress incontinence (female) (male): Secondary | ICD-10-CM | POA: Diagnosis not present

## 2014-07-21 DIAGNOSIS — C61 Malignant neoplasm of prostate: Secondary | ICD-10-CM | POA: Diagnosis not present

## 2014-08-03 DIAGNOSIS — M6281 Muscle weakness (generalized): Secondary | ICD-10-CM | POA: Diagnosis not present

## 2014-08-03 DIAGNOSIS — N393 Stress incontinence (female) (male): Secondary | ICD-10-CM | POA: Diagnosis not present

## 2014-08-03 DIAGNOSIS — R278 Other lack of coordination: Secondary | ICD-10-CM | POA: Diagnosis not present

## 2014-08-25 DIAGNOSIS — E780 Pure hypercholesterolemia: Secondary | ICD-10-CM | POA: Diagnosis not present

## 2014-08-25 DIAGNOSIS — I10 Essential (primary) hypertension: Secondary | ICD-10-CM | POA: Diagnosis not present

## 2014-08-25 DIAGNOSIS — D751 Secondary polycythemia: Secondary | ICD-10-CM | POA: Diagnosis not present

## 2014-08-25 DIAGNOSIS — M1990 Unspecified osteoarthritis, unspecified site: Secondary | ICD-10-CM | POA: Diagnosis not present

## 2014-08-25 DIAGNOSIS — C61 Malignant neoplasm of prostate: Secondary | ICD-10-CM | POA: Diagnosis not present

## 2014-08-25 DIAGNOSIS — G459 Transient cerebral ischemic attack, unspecified: Secondary | ICD-10-CM | POA: Diagnosis not present

## 2014-08-25 DIAGNOSIS — D696 Thrombocytopenia, unspecified: Secondary | ICD-10-CM | POA: Diagnosis not present

## 2014-08-25 DIAGNOSIS — N5201 Erectile dysfunction due to arterial insufficiency: Secondary | ICD-10-CM | POA: Diagnosis not present

## 2014-08-25 DIAGNOSIS — Z0000 Encounter for general adult medical examination without abnormal findings: Secondary | ICD-10-CM | POA: Diagnosis not present

## 2014-08-30 DIAGNOSIS — R278 Other lack of coordination: Secondary | ICD-10-CM | POA: Diagnosis not present

## 2014-08-30 DIAGNOSIS — N393 Stress incontinence (female) (male): Secondary | ICD-10-CM | POA: Diagnosis not present

## 2014-08-30 DIAGNOSIS — M6281 Muscle weakness (generalized): Secondary | ICD-10-CM | POA: Diagnosis not present

## 2014-09-02 ENCOUNTER — Other Ambulatory Visit: Payer: Self-pay | Admitting: Hematology and Oncology

## 2014-09-02 ENCOUNTER — Telehealth: Payer: Self-pay | Admitting: Hematology and Oncology

## 2014-09-02 DIAGNOSIS — C61 Malignant neoplasm of prostate: Secondary | ICD-10-CM

## 2014-09-02 DIAGNOSIS — D751 Secondary polycythemia: Secondary | ICD-10-CM

## 2014-09-02 NOTE — Telephone Encounter (Signed)
s.w pt and advised on 3.8 and 3.10 appt....pt ok and aware

## 2014-09-05 ENCOUNTER — Telehealth: Payer: Self-pay | Admitting: Hematology and Oncology

## 2014-09-05 NOTE — Telephone Encounter (Signed)
s.w. and r/s MD visit due to having a colonoscopy....pt ok and aware

## 2014-09-06 ENCOUNTER — Other Ambulatory Visit (HOSPITAL_BASED_OUTPATIENT_CLINIC_OR_DEPARTMENT_OTHER): Payer: Medicare Other

## 2014-09-06 DIAGNOSIS — D751 Secondary polycythemia: Secondary | ICD-10-CM

## 2014-09-06 DIAGNOSIS — D45 Polycythemia vera: Secondary | ICD-10-CM | POA: Diagnosis not present

## 2014-09-06 DIAGNOSIS — C61 Malignant neoplasm of prostate: Secondary | ICD-10-CM | POA: Diagnosis not present

## 2014-09-06 LAB — CBC WITH DIFFERENTIAL/PLATELET
BASO%: 0.7 % (ref 0.0–2.0)
Basophils Absolute: 0 10*3/uL (ref 0.0–0.1)
EOS ABS: 0 10*3/uL (ref 0.0–0.5)
EOS%: 0.6 % (ref 0.0–7.0)
HEMATOCRIT: 54.2 % — AB (ref 38.4–49.9)
HGB: 16.8 g/dL (ref 13.0–17.1)
LYMPH%: 44.3 % (ref 14.0–49.0)
MCH: 24.1 pg — ABNORMAL LOW (ref 27.2–33.4)
MCHC: 30.9 g/dL — ABNORMAL LOW (ref 32.0–36.0)
MCV: 78 fL — ABNORMAL LOW (ref 79.3–98.0)
MONO#: 0.4 10*3/uL (ref 0.1–0.9)
MONO%: 11.3 % (ref 0.0–14.0)
NEUT#: 1.5 10*3/uL (ref 1.5–6.5)
NEUT%: 43.1 % (ref 39.0–75.0)
Platelets: 154 10*3/uL (ref 140–400)
RBC: 6.94 10*6/uL — AB (ref 4.20–5.82)
RDW: 16.9 % — AB (ref 11.0–14.6)
WBC: 3.5 10*3/uL — ABNORMAL LOW (ref 4.0–10.3)
lymph#: 1.6 10*3/uL (ref 0.9–3.3)

## 2014-09-08 ENCOUNTER — Ambulatory Visit: Payer: Medicare Other | Admitting: Hematology and Oncology

## 2014-09-08 LAB — ERYTHROPOIETIN: ERYTHROPOIETIN: 7.8 m[IU]/mL (ref 2.6–18.5)

## 2014-09-08 LAB — PSA: PSA: <0.01 ng/mL (ref <=4.00)

## 2014-09-20 ENCOUNTER — Ambulatory Visit (HOSPITAL_BASED_OUTPATIENT_CLINIC_OR_DEPARTMENT_OTHER): Payer: Medicare Other | Admitting: Hematology and Oncology

## 2014-09-20 ENCOUNTER — Encounter: Payer: Self-pay | Admitting: Hematology and Oncology

## 2014-09-20 ENCOUNTER — Telehealth: Payer: Self-pay | Admitting: Hematology and Oncology

## 2014-09-20 VITALS — BP 176/90 | HR 92 | Temp 97.9°F | Resp 20 | Ht 68.0 in | Wt 187.5 lb

## 2014-09-20 DIAGNOSIS — D751 Secondary polycythemia: Secondary | ICD-10-CM

## 2014-09-20 DIAGNOSIS — Z8546 Personal history of malignant neoplasm of prostate: Secondary | ICD-10-CM | POA: Diagnosis not present

## 2014-09-20 DIAGNOSIS — I10 Essential (primary) hypertension: Secondary | ICD-10-CM | POA: Diagnosis not present

## 2014-09-20 NOTE — Assessment & Plan Note (Signed)
According to the patient, he did well after surgery. He is being monitored by urologist with PSA surveillance monitoring on a routine basis.

## 2014-09-20 NOTE — Assessment & Plan Note (Addendum)
The patient has been tested extensively. Overall picture is not consistent with myeloproliferative disorder. JAK2 mutation and serum erythropoietin were within normal limits. I suspect there may be a component of undiagnosed obstructive sleep apnea. I recommend forphlebotomy one unit of blood monthly to keep hematocrit under 45%. In the meantime, he will continue aspirin therapy to prevent risk of blood clot. The risks, benefit, side effects of phlebotomy is discussed with him and his wife and he agreed to proceed.

## 2014-09-20 NOTE — Telephone Encounter (Signed)
gave and printed appt sched and avs fo rpt for April thru Sept ...sed added tx.

## 2014-09-20 NOTE — Assessment & Plan Note (Signed)
He is on medication for this. I suspect erythrocytosis can cause his blood pressure to run high. Hopefully, with regular phlebotomy sessions, this would help bring down his blood pressure in the long-term.

## 2014-09-20 NOTE — Telephone Encounter (Signed)
Pt could not stay today for phlebot and needs to r/s to 3/25 per nicki,done

## 2014-09-20 NOTE — Progress Notes (Signed)
[facility] OFFICE PROGRESS NOTE  [name], MD SUMMARY OF HEMATOLOGIC HISTORY: This is a pleasant gentleman who was found to have abnormal CBC. In 2014 he had a bone marrow aspirate and biopsy that came back nondiagnostic for myeloproliferative disorder. JAK 2 mutation testing was negative. The patient had received 3 units of phlebotomy sessions and remain on aspirin INTERVAL HISTORY: Cory [name] 79 y.o. male returns for further follow-up. He denies recent diagnosis blood clot. His wife noted the patient does snore regularly. Denies any chest pain,, shortness of breath, skin itching or leg cramps. I have reviewed the past medical history, past surgical history, social history and family history with the patient and they are unchanged from previous note.  ALLERGIES:  has No Known Allergies.  MEDICATIONS:  Current Outpatient Prescriptions  Medication Sig Dispense Refill  . aspirin EC 81 MG tablet Take 81 mg by mouth daily.    Marland Kitchen atorvastatin (LIPITOR) 40 MG tablet Take 40 mg by mouth daily.    . benazepril-hydrochlorthiazide (LOTENSIN HCT) 20-12.5 MG per tablet Take 1 tablet by mouth every morning.    . ezetimibe (ZETIA) 10 MG tablet Take 10 mg by mouth every morning.     No current facility-administered medications for this visit.     REVIEW OF SYSTEMS:   Constitutional: Denies fevers, chills or night sweats Eyes: Denies blurriness of vision Ears, nose, mouth, throat, and face: Denies mucositis or sore throat Respiratory: Denies cough, dyspnea or wheezes Cardiovascular: Denies palpitation, chest discomfort or lower extremity swelling Gastrointestinal:  Denies nausea, heartburn or change in bowel habits Skin: Denies abnormal skin rashes Lymphatics: Denies new lymphadenopathy or easy bruising Neurological:Denies numbness, tingling or new weaknesses Behavioral/Psych: Mood is stable, no new changes  All other systems were reviewed with the patient and are  negative.  PHYSICAL EXAMINATION: ECOG PERFORMANCE STATUS: 0 - Asymptomatic  Filed Vitals:   09/20/14 1107  BP: 176/90  Pulse: 92  Temp: 97.9 F (36.6 C)  Resp: 20   Filed Weights   09/20/14 1107  Weight: 187 lb 8 oz (85.049 kg)    GENERAL:alert, no distress and comfortable SKIN: skin color, texture, turgor are normal, no rashes or significant lesions EYES: normal, Conjunctiva are pink and non-injected, sclera clear OROPHARYNX:no exudate, no erythema and lips, buccal mucosa, and tongue normal  NECK: supple, thyroid normal size, non-tender, without nodularity LYMPH:  no palpable lymphadenopathy in the cervical, axillary or inguinal LUNGS: clear to auscultation and percussion with normal breathing effort HEART: regular rate & rhythm and no murmurs and no lower extremity edema ABDOMEN:abdomen soft, non-tender and normal bowel sounds Musculoskeletal:no cyanosis of digits and no clubbing  NEURO: alert & oriented x 3 with fluent speech, no focal motor/sensory deficits  LABORATORY DATA:  I have reviewed the data as listed No results found for this or any previous visit (from the past 48 hour(s)).  Lab Results  Component Value Date   WBC 3.5* 09/06/2014   HGB 16.8 09/06/2014   HCT 54.2* 09/06/2014   MCV 78.0* 09/06/2014   PLT 154 09/06/2014    ASSESSMENT & PLAN:  Polycythemia The patient has been tested extensively. Overall picture is not consistent with myeloproliferative disorder. JAK2 mutation and serum erythropoietin were within normal limits. I suspect there may be a component of undiagnosed obstructive sleep apnea. I recommend forphlebotomy one unit of blood monthly to keep hematocrit under 45%. In the meantime, he will continue aspirin therapy to prevent risk of blood clot. The risks, benefit, side  effects of phlebotomy is discussed with him and his wife and he agreed to proceed.   History of prostate cancer According to the patient, he did well after surgery. He is  being monitored by urologist with PSA surveillance monitoring on a routine basis.   Essential hypertension He is on medication for this. I suspect erythrocytosis can cause his blood pressure to run high. Hopefully, with regular phlebotomy sessions, this would help bring down his blood pressure in the long-term.      All questions were answered. The patient knows to call the clinic with any problems, questions or concerns. No barriers to learning was detected.  I spent 25 minutes counseling the patient face to face. The total time spent in the appointment was 30 minutes and more than 50% was on counseling.     [facility], Layan Zalenski, MD 3/22/20164:35 PM

## 2014-09-23 ENCOUNTER — Ambulatory Visit (HOSPITAL_BASED_OUTPATIENT_CLINIC_OR_DEPARTMENT_OTHER): Payer: Medicare Other

## 2014-09-23 DIAGNOSIS — D751 Secondary polycythemia: Secondary | ICD-10-CM | POA: Diagnosis not present

## 2014-09-23 NOTE — Patient Instructions (Signed)

## 2014-09-23 NOTE — Progress Notes (Signed)
Phlebotomy performed with # 16 g needle in L ACF for 500 gm. Over 10 minutes. Tolerated very well. Offered beverages after.  Denies any dizzyness etc.  VSS pre and post phlebotomy.

## 2014-10-04 ENCOUNTER — Ambulatory Visit: Payer: Medicare Other

## 2014-10-04 ENCOUNTER — Ambulatory Visit: Payer: Medicare Other | Admitting: Oncology

## 2014-10-05 DIAGNOSIS — R278 Other lack of coordination: Secondary | ICD-10-CM | POA: Diagnosis not present

## 2014-10-05 DIAGNOSIS — M6281 Muscle weakness (generalized): Secondary | ICD-10-CM | POA: Diagnosis not present

## 2014-10-05 DIAGNOSIS — N393 Stress incontinence (female) (male): Secondary | ICD-10-CM | POA: Diagnosis not present

## 2014-10-18 ENCOUNTER — Other Ambulatory Visit (HOSPITAL_BASED_OUTPATIENT_CLINIC_OR_DEPARTMENT_OTHER): Payer: Medicare Other

## 2014-10-18 ENCOUNTER — Ambulatory Visit (HOSPITAL_BASED_OUTPATIENT_CLINIC_OR_DEPARTMENT_OTHER): Payer: Medicare Other

## 2014-10-18 DIAGNOSIS — D751 Secondary polycythemia: Secondary | ICD-10-CM

## 2014-10-18 LAB — CBC WITH DIFFERENTIAL/PLATELET
BASO%: 0.2 % (ref 0.0–2.0)
Basophils Absolute: 0 10*3/uL (ref 0.0–0.1)
EOS%: 0.6 % (ref 0.0–7.0)
Eosinophils Absolute: 0 10*3/uL (ref 0.0–0.5)
HEMATOCRIT: 48.7 % (ref 38.4–49.9)
HGB: 16 g/dL (ref 13.0–17.1)
LYMPH%: 21.8 % (ref 14.0–49.0)
MCH: 26.1 pg — AB (ref 27.2–33.4)
MCHC: 32.9 g/dL (ref 32.0–36.0)
MCV: 79.3 fL (ref 79.3–98.0)
MONO#: 0.5 10*3/uL (ref 0.1–0.9)
MONO%: 8.2 % (ref 0.0–14.0)
NEUT#: 4.3 10*3/uL (ref 1.5–6.5)
NEUT%: 69.2 % (ref 39.0–75.0)
PLATELETS: 152 10*3/uL (ref 140–400)
RBC: 6.14 10*6/uL — ABNORMAL HIGH (ref 4.20–5.82)
RDW: 15.3 % — ABNORMAL HIGH (ref 11.0–14.6)
WBC: 6.3 10*3/uL (ref 4.0–10.3)
lymph#: 1.4 10*3/uL (ref 0.9–3.3)

## 2014-10-18 NOTE — Patient Instructions (Signed)

## 2014-10-18 NOTE — Progress Notes (Signed)
16g phleb needle x 1 in right ac with 195cc blood return. Reinitiated 16g phleb x 1 in left ac with 320cc blood return. Pt tolerated well

## 2014-11-10 ENCOUNTER — Telehealth: Payer: Self-pay | Admitting: Hematology and Oncology

## 2014-11-10 NOTE — Telephone Encounter (Signed)
returned call and s.w. pt and r/s appt due to brother died...done....pt ok adn aware of new d.t

## 2014-11-15 ENCOUNTER — Other Ambulatory Visit: Payer: Medicare Other

## 2014-11-17 ENCOUNTER — Ambulatory Visit (HOSPITAL_BASED_OUTPATIENT_CLINIC_OR_DEPARTMENT_OTHER): Payer: Medicare Other

## 2014-11-17 ENCOUNTER — Other Ambulatory Visit (HOSPITAL_BASED_OUTPATIENT_CLINIC_OR_DEPARTMENT_OTHER): Payer: Medicare Other

## 2014-11-17 DIAGNOSIS — D751 Secondary polycythemia: Secondary | ICD-10-CM | POA: Diagnosis present

## 2014-11-17 LAB — CBC WITH DIFFERENTIAL/PLATELET
BASO%: 0.3 % (ref 0.0–2.0)
Basophils Absolute: 0 10*3/uL (ref 0.0–0.1)
EOS ABS: 0 10*3/uL (ref 0.0–0.5)
EOS%: 0.6 % (ref 0.0–7.0)
HCT: 50.4 % — ABNORMAL HIGH (ref 38.4–49.9)
HGB: 16 g/dL (ref 13.0–17.1)
LYMPH%: 36.5 % (ref 14.0–49.0)
MCH: 25.1 pg — ABNORMAL LOW (ref 27.2–33.4)
MCHC: 31.8 g/dL — ABNORMAL LOW (ref 32.0–36.0)
MCV: 78.8 fL — ABNORMAL LOW (ref 79.3–98.0)
MONO#: 0.4 10*3/uL (ref 0.1–0.9)
MONO%: 10.1 % (ref 0.0–14.0)
NEUT%: 52.5 % (ref 39.0–75.0)
NEUTROS ABS: 1.8 10*3/uL (ref 1.5–6.5)
Platelets: 156 10*3/uL (ref 140–400)
RBC: 6.39 10*6/uL — AB (ref 4.20–5.82)
RDW: 14.8 % — ABNORMAL HIGH (ref 11.0–14.6)
WBC: 3.5 10*3/uL — AB (ref 4.0–10.3)
lymph#: 1.3 10*3/uL (ref 0.9–3.3)

## 2014-11-22 DIAGNOSIS — H16142 Punctate keratitis, left eye: Secondary | ICD-10-CM | POA: Diagnosis not present

## 2014-11-22 DIAGNOSIS — H25813 Combined forms of age-related cataract, bilateral: Secondary | ICD-10-CM | POA: Diagnosis not present

## 2014-11-22 DIAGNOSIS — H0289 Other specified disorders of eyelid: Secondary | ICD-10-CM | POA: Diagnosis not present

## 2014-11-22 DIAGNOSIS — T1502XA Foreign body in cornea, left eye, initial encounter: Secondary | ICD-10-CM | POA: Diagnosis not present

## 2014-11-22 DIAGNOSIS — H11013 Amyloid pterygium of eye, bilateral: Secondary | ICD-10-CM | POA: Diagnosis not present

## 2014-11-30 DIAGNOSIS — H0289 Other specified disorders of eyelid: Secondary | ICD-10-CM | POA: Diagnosis not present

## 2014-11-30 DIAGNOSIS — H25813 Combined forms of age-related cataract, bilateral: Secondary | ICD-10-CM | POA: Diagnosis not present

## 2014-11-30 DIAGNOSIS — H11013 Amyloid pterygium of eye, bilateral: Secondary | ICD-10-CM | POA: Diagnosis not present

## 2014-11-30 DIAGNOSIS — H16142 Punctate keratitis, left eye: Secondary | ICD-10-CM | POA: Diagnosis not present

## 2014-11-30 DIAGNOSIS — T1502XA Foreign body in cornea, left eye, initial encounter: Secondary | ICD-10-CM | POA: Diagnosis not present

## 2014-12-13 ENCOUNTER — Ambulatory Visit (HOSPITAL_BASED_OUTPATIENT_CLINIC_OR_DEPARTMENT_OTHER): Payer: Medicare Other

## 2014-12-13 ENCOUNTER — Other Ambulatory Visit: Payer: Self-pay | Admitting: *Deleted

## 2014-12-13 ENCOUNTER — Other Ambulatory Visit (HOSPITAL_BASED_OUTPATIENT_CLINIC_OR_DEPARTMENT_OTHER): Payer: Medicare Other

## 2014-12-13 DIAGNOSIS — D751 Secondary polycythemia: Secondary | ICD-10-CM

## 2014-12-13 LAB — CBC WITH DIFFERENTIAL/PLATELET
BASO%: 0.3 % (ref 0.0–2.0)
Basophils Absolute: 0 10*3/uL (ref 0.0–0.1)
EOS%: 0.8 % (ref 0.0–7.0)
Eosinophils Absolute: 0 10*3/uL (ref 0.0–0.5)
HEMATOCRIT: 49.6 % (ref 38.4–49.9)
HGB: 16.1 g/dL (ref 13.0–17.1)
LYMPH%: 43.3 % (ref 14.0–49.0)
MCH: 26 pg — ABNORMAL LOW (ref 27.2–33.4)
MCHC: 32.5 g/dL (ref 32.0–36.0)
MCV: 80.1 fL (ref 79.3–98.0)
MONO#: 0.4 10*3/uL (ref 0.1–0.9)
MONO%: 9.8 % (ref 0.0–14.0)
NEUT#: 1.6 10*3/uL (ref 1.5–6.5)
NEUT%: 45.8 % (ref 39.0–75.0)
PLATELETS: 152 10*3/uL (ref 140–400)
RBC: 6.19 10*6/uL — ABNORMAL HIGH (ref 4.20–5.82)
RDW: 14.5 % (ref 11.0–14.6)
WBC: 3.6 10*3/uL — ABNORMAL LOW (ref 4.0–10.3)
lymph#: 1.6 10*3/uL (ref 0.9–3.3)

## 2014-12-13 NOTE — Progress Notes (Signed)
16g phlebotomy set used to perform phlebotomy via left AC over approximately 20 minutes.  485gm removed before tubing clotted off.  Pt provided with drink.  Pt observed for 30 minutes post procedure.  VSS.

## 2014-12-13 NOTE — Patient Instructions (Signed)

## 2014-12-26 ENCOUNTER — Other Ambulatory Visit: Payer: Self-pay

## 2014-12-30 DIAGNOSIS — H11013 Amyloid pterygium of eye, bilateral: Secondary | ICD-10-CM | POA: Diagnosis not present

## 2014-12-30 DIAGNOSIS — H31091 Other chorioretinal scars, right eye: Secondary | ICD-10-CM | POA: Diagnosis not present

## 2014-12-30 DIAGNOSIS — H0289 Other specified disorders of eyelid: Secondary | ICD-10-CM | POA: Diagnosis not present

## 2014-12-30 DIAGNOSIS — T1502XA Foreign body in cornea, left eye, initial encounter: Secondary | ICD-10-CM | POA: Diagnosis not present

## 2014-12-30 DIAGNOSIS — H16142 Punctate keratitis, left eye: Secondary | ICD-10-CM | POA: Diagnosis not present

## 2014-12-30 DIAGNOSIS — H25813 Combined forms of age-related cataract, bilateral: Secondary | ICD-10-CM | POA: Diagnosis not present

## 2015-01-10 ENCOUNTER — Other Ambulatory Visit (HOSPITAL_BASED_OUTPATIENT_CLINIC_OR_DEPARTMENT_OTHER): Payer: Medicare Other

## 2015-01-10 ENCOUNTER — Ambulatory Visit (HOSPITAL_BASED_OUTPATIENT_CLINIC_OR_DEPARTMENT_OTHER): Payer: Medicare Other

## 2015-01-10 DIAGNOSIS — Z8546 Personal history of malignant neoplasm of prostate: Secondary | ICD-10-CM | POA: Diagnosis not present

## 2015-01-10 DIAGNOSIS — D751 Secondary polycythemia: Secondary | ICD-10-CM

## 2015-01-10 LAB — CBC WITH DIFFERENTIAL/PLATELET
BASO%: 0.9 % (ref 0.0–2.0)
Basophils Absolute: 0 10*3/uL (ref 0.0–0.1)
EOS%: 1.3 % (ref 0.0–7.0)
Eosinophils Absolute: 0.1 10*3/uL (ref 0.0–0.5)
HEMATOCRIT: 48.9 % (ref 38.4–49.9)
HEMOGLOBIN: 15.4 g/dL (ref 13.0–17.1)
LYMPH#: 1.7 10*3/uL (ref 0.9–3.3)
LYMPH%: 41 % (ref 14.0–49.0)
MCH: 24.8 pg — ABNORMAL LOW (ref 27.2–33.4)
MCHC: 31.6 g/dL — AB (ref 32.0–36.0)
MCV: 78.6 fL — ABNORMAL LOW (ref 79.3–98.0)
MONO#: 0.5 10*3/uL (ref 0.1–0.9)
MONO%: 11.2 % (ref 0.0–14.0)
NEUT#: 1.9 10*3/uL (ref 1.5–6.5)
NEUT%: 45.6 % (ref 39.0–75.0)
PLATELETS: 159 10*3/uL (ref 140–400)
RBC: 6.22 10*6/uL — ABNORMAL HIGH (ref 4.20–5.82)
RDW: 14.9 % — ABNORMAL HIGH (ref 11.0–14.6)
WBC: 4.1 10*3/uL (ref 4.0–10.3)

## 2015-01-10 NOTE — Progress Notes (Signed)
Phlebotomy performed on patient. 514 grams removed from patient left A/C. Patient tolerated well. Drinks provided after phlebotomy. Patient discharged in stable condition post 30 minute observation.

## 2015-01-10 NOTE — Patient Instructions (Signed)

## 2015-01-12 DIAGNOSIS — C61 Malignant neoplasm of prostate: Secondary | ICD-10-CM | POA: Diagnosis not present

## 2015-01-19 DIAGNOSIS — N393 Stress incontinence (female) (male): Secondary | ICD-10-CM | POA: Diagnosis not present

## 2015-01-19 DIAGNOSIS — C61 Malignant neoplasm of prostate: Secondary | ICD-10-CM | POA: Diagnosis not present

## 2015-02-07 ENCOUNTER — Ambulatory Visit (HOSPITAL_BASED_OUTPATIENT_CLINIC_OR_DEPARTMENT_OTHER): Payer: Medicare Other

## 2015-02-07 ENCOUNTER — Other Ambulatory Visit (HOSPITAL_BASED_OUTPATIENT_CLINIC_OR_DEPARTMENT_OTHER): Payer: Medicare Other

## 2015-02-07 ENCOUNTER — Other Ambulatory Visit: Payer: Self-pay | Admitting: Neurology

## 2015-02-07 DIAGNOSIS — D751 Secondary polycythemia: Secondary | ICD-10-CM

## 2015-02-07 LAB — CBC WITH DIFFERENTIAL/PLATELET
BASO%: 0.6 % (ref 0.0–2.0)
Basophils Absolute: 0 10*3/uL (ref 0.0–0.1)
EOS ABS: 0 10*3/uL (ref 0.0–0.5)
EOS%: 1.2 % (ref 0.0–7.0)
HEMATOCRIT: 48.7 % (ref 38.4–49.9)
HGB: 15.4 g/dL (ref 13.0–17.1)
LYMPH#: 1.7 10*3/uL (ref 0.9–3.3)
LYMPH%: 43.4 % (ref 14.0–49.0)
MCH: 24.7 pg — AB (ref 27.2–33.4)
MCHC: 31.6 g/dL — ABNORMAL LOW (ref 32.0–36.0)
MCV: 77.9 fL — AB (ref 79.3–98.0)
MONO#: 0.5 10*3/uL (ref 0.1–0.9)
MONO%: 12.5 % (ref 0.0–14.0)
NEUT#: 1.7 10*3/uL (ref 1.5–6.5)
NEUT%: 42.3 % (ref 39.0–75.0)
PLATELETS: 148 10*3/uL (ref 140–400)
RBC: 6.24 10*6/uL — ABNORMAL HIGH (ref 4.20–5.82)
RDW: 15.3 % — ABNORMAL HIGH (ref 11.0–14.6)
WBC: 4 10*3/uL (ref 4.0–10.3)

## 2015-02-07 NOTE — Patient Instructions (Addendum)
Therapeutic Phlebotomy Therapeutic phlebotomy is the controlled removal of blood from your body for the purpose of treating a medical condition. It is similar to donating blood. Usually, about a pint (470 mL) of blood is removed. The average adult has 9 to 12 pints (4.3 to 5.7 L) of blood. Therapeutic phlebotomy may be used to treat the following medical conditions:  Hemochromatosis. This is a condition in which there is too much iron in the blood.  Polycythemia vera. This is a condition in which there are too many red cells in the blood.  Porphyria cutanea tarda. This is a disease usually passed from one generation to the next (inherited). It is a condition in which an important part of hemoglobin is not made properly. This results in the build up of abnormal amounts of porphyrins in the body.  Sickle cell disease. This is an inherited disease. It is a condition in which the red blood cells form an abnormal crescent shape rather than a round shape. LET YOUR CAREGIVER KNOW ABOUT:  Allergies.  Medicines taken including herbs, eyedrops, over-the-counter medicines, and creams.  Use of steroids (by mouth or creams).  Previous problems with anesthetics or numbing medicine.  History of blood clots.  History of bleeding or blood problems.  Previous surgery.  Possibility of pregnancy, if this applies. RISKS AND COMPLICATIONS This is a simple and safe procedure. Problems are unlikely. However, problems can occur and may include:  Nausea or lightheadedness.  Low blood pressure.  Soreness, bleeding, swelling, or bruising at the needle insertion site.  Infection. BEFORE THE PROCEDURE  This is a procedure that can be done as an outpatient. Confirm the time that you need to arrive for your procedure. Confirm whether there is a need to fast or withhold any medications. It is helpful to wear clothing with sleeves that can be raised above the elbow. A blood sample may be done to determine the  amount of red blood cells or iron in your blood. Plan ahead of time to have someone drive you home after the procedure. PROCEDURE The entire procedure from preparation through recovery takes about 1 hour. The actual collection takes about 10 to 15 minutes.  A needle will be inserted into your vein.  Tubing and a collection bag will be attached to that needle.  Blood will flow through the needle and tubing into the collection bag.  You may be asked to open and close your hand slowly and continuously during the entire collection.  Once the specified amount of blood has been removed from your body, the collection bag and tubing will be clamped.  The needle will be removed.  Pressure will be held on the site of the needle insertion to stop the bleeding. Then a bandage will be placed over the needle insertion site. AFTER THE PROCEDURE  Your recovery will be assessed and monitored. If there are no problems, as an outpatient, you should be able to go home shortly after the procedure.  Document Released: 11/19/2010 Document Revised: 09/09/2011 Document Reviewed: 11/19/2010 [facility] Patient Information 2015 [place], [place]. This information is not intended to replace advice given to you by your health care provider. Make sure you discuss any questions you have with your health care provider. Therapeutic Phlebotomy Therapeutic phlebotomy is the controlled removal of blood from your body for the purpose of treating a medical condition. It is similar to donating blood. Usually, about a pint (470 mL) of blood is removed. The average adult has 9 to 12 pints (  4.3 to 5.7 L) of blood. Therapeutic phlebotomy may be used to treat the following medical conditions:  Hemochromatosis. This is a condition in which there is too much iron in the blood.  Polycythemia vera. This is a condition in which there are too many red cells in the blood.  Porphyria cutanea tarda. This is a disease usually passed from one  generation to the next (inherited). It is a condition in which an important part of hemoglobin is not made properly. This results in the build up of abnormal amounts of porphyrins in the body.  Sickle cell disease. This is an inherited disease. It is a condition in which the red blood cells form an abnormal crescent shape rather than a round shape. LET YOUR CAREGIVER KNOW ABOUT:  Allergies.  Medicines taken including herbs, eyedrops, over-the-counter medicines, and creams.  Use of steroids (by mouth or creams).  Previous problems with anesthetics or numbing medicine.  History of blood clots.  History of bleeding or blood problems.  Previous surgery.  Possibility of pregnancy, if this applies. RISKS AND COMPLICATIONS This is a simple and safe procedure. Problems are unlikely. However, problems can occur and may include:  Nausea or lightheadedness.  Low blood pressure.  Soreness, bleeding, swelling, or bruising at the needle insertion site.  Infection. BEFORE THE PROCEDURE  This is a procedure that can be done as an outpatient. Confirm the time that you need to arrive for your procedure. Confirm whether there is a need to fast or withhold any medications. It is helpful to wear clothing with sleeves that can be raised above the elbow. A blood sample may be done to determine the amount of red blood cells or iron in your blood. Plan ahead of time to have someone drive you home after the procedure. PROCEDURE The entire procedure from preparation through recovery takes about 1 hour. The actual collection takes about 10 to 15 minutes.  A needle will be inserted into your vein.  Tubing and a collection bag will be attached to that needle.  Blood will flow through the needle and tubing into the collection bag.  You may be asked to open and close your hand slowly and continuously during the entire collection.  Once the specified amount of blood has been removed from your body, the  collection bag and tubing will be clamped.  The needle will be removed.  Pressure will be held on the site of the needle insertion to stop the bleeding. Then a bandage will be placed over the needle insertion site. AFTER THE PROCEDURE  Your recovery will be assessed and monitored. If there are no problems, as an outpatient, you should be able to go home shortly after the procedure.  Document Released: 11/19/2010 Document Revised: 09/09/2011 Document Reviewed: 11/19/2010 [facility] Patient Information 2015 [place], [place]. This information is not intended to replace advice given to you by your health care provider. Make sure you discuss any questions you have with your health care provider.

## 2015-02-23 DIAGNOSIS — M1990 Unspecified osteoarthritis, unspecified site: Secondary | ICD-10-CM | POA: Diagnosis not present

## 2015-02-23 DIAGNOSIS — E780 Pure hypercholesterolemia: Secondary | ICD-10-CM | POA: Diagnosis not present

## 2015-02-23 DIAGNOSIS — I10 Essential (primary) hypertension: Secondary | ICD-10-CM | POA: Diagnosis not present

## 2015-02-23 DIAGNOSIS — M545 Low back pain: Secondary | ICD-10-CM | POA: Diagnosis not present

## 2015-03-07 ENCOUNTER — Ambulatory Visit (HOSPITAL_BASED_OUTPATIENT_CLINIC_OR_DEPARTMENT_OTHER): Payer: Medicare Other | Admitting: Hematology and Oncology

## 2015-03-07 ENCOUNTER — Telehealth: Payer: Self-pay | Admitting: *Deleted

## 2015-03-07 ENCOUNTER — Encounter: Payer: Self-pay | Admitting: Hematology and Oncology

## 2015-03-07 ENCOUNTER — Telehealth: Payer: Self-pay | Admitting: Hematology and Oncology

## 2015-03-07 ENCOUNTER — Other Ambulatory Visit (HOSPITAL_BASED_OUTPATIENT_CLINIC_OR_DEPARTMENT_OTHER): Payer: Medicare Other

## 2015-03-07 VITALS — BP 153/72 | HR 85 | Temp 98.0°F | Resp 18 | Ht 68.0 in | Wt 191.2 lb

## 2015-03-07 DIAGNOSIS — I10 Essential (primary) hypertension: Secondary | ICD-10-CM | POA: Diagnosis not present

## 2015-03-07 DIAGNOSIS — Z8546 Personal history of malignant neoplasm of prostate: Secondary | ICD-10-CM | POA: Diagnosis not present

## 2015-03-07 DIAGNOSIS — D751 Secondary polycythemia: Secondary | ICD-10-CM | POA: Diagnosis not present

## 2015-03-07 LAB — CBC WITH DIFFERENTIAL/PLATELET
BASO%: 0 % (ref 0.0–2.0)
Basophils Absolute: 0 10*3/uL (ref 0.0–0.1)
EOS%: 0.8 % (ref 0.0–7.0)
Eosinophils Absolute: 0 10*3/uL (ref 0.0–0.5)
HEMATOCRIT: 47.7 % (ref 38.4–49.9)
HGB: 15.7 g/dL (ref 13.0–17.1)
LYMPH%: 36.9 % (ref 14.0–49.0)
MCH: 25.2 pg — AB (ref 27.2–33.4)
MCHC: 32.9 g/dL (ref 32.0–36.0)
MCV: 76.6 fL — ABNORMAL LOW (ref 79.3–98.0)
MONO#: 0.4 10*3/uL (ref 0.1–0.9)
MONO%: 10.4 % (ref 0.0–14.0)
NEUT%: 51.9 % (ref 39.0–75.0)
NEUTROS ABS: 2.1 10*3/uL (ref 1.5–6.5)
Platelets: 167 10*3/uL (ref 140–400)
RBC: 6.23 10*6/uL — AB (ref 4.20–5.82)
RDW: 15.6 % — ABNORMAL HIGH (ref 11.0–14.6)
WBC: 4 10*3/uL (ref 4.0–10.3)
lymph#: 1.5 10*3/uL (ref 0.9–3.3)
nRBC: 0 % (ref 0–0)

## 2015-03-07 NOTE — Assessment & Plan Note (Signed)
The patient has been tested extensively. Overall picture is not consistent with myeloproliferative disorder. JAK2 mutation and serum erythropoietin were within normal limits. I suspect there may be a component of undiagnosed obstructive sleep apnea. I recommend phlebotomy one unit of blood every 6 weeks to keep hematocrit under 45%. In the meantime, he will continue aspirin therapy to prevent risk of blood clot. The risks, benefit, side effects of phlebotomy is discussed with him and his wife and he agreed to proceed.

## 2015-03-07 NOTE — Telephone Encounter (Signed)
per pof to sch pt appt-sent MW email to sch phlebotmy appts-pt will look on MY CHART-will call pt after reply

## 2015-03-07 NOTE — Progress Notes (Signed)
[facility] OFFICE PROGRESS NOTE  [name], MD SUMMARY OF HEMATOLOGIC HISTORY:  This is a pleasant gentleman who was found to have abnormal CBC with polycythemia In 2014 he had a bone marrow aspirate and biopsy that came back nondiagnostic for myeloproliferative disorder. JAK 2 mutation testing was negative. The patient had intermittent phlebotomy sessions and remain on aspirin INTERVAL HISTORY: Cory [name] 79 y.o. male returns for further follow-up. He feels well. Denies any side effects from phlebotomy.  I have reviewed the past medical history, past surgical history, social history and family history with the patient and they are unchanged from previous note.  ALLERGIES:  has No Known Allergies.  MEDICATIONS:  Current Outpatient Prescriptions  Medication Sig Dispense Refill  . aspirin EC 81 MG tablet Take 81 mg by mouth daily.    Marland Kitchen atorvastatin (LIPITOR) 40 MG tablet Take 40 mg by mouth daily.    . benazepril-hydrochlorthiazide (LOTENSIN HCT) 20-12.5 MG per tablet Take 1 tablet by mouth every morning.    . ezetimibe (ZETIA) 10 MG tablet Take 10 mg by mouth every morning.     No current facility-administered medications for this visit.     REVIEW OF SYSTEMS:   Constitutional: Denies fevers, chills or night sweats Eyes: Denies blurriness of vision Ears, nose, mouth, throat, and face: Denies mucositis or sore throat Respiratory: Denies cough, dyspnea or wheezes Cardiovascular: Denies palpitation, chest discomfort or lower extremity swelling Gastrointestinal:  Denies nausea, heartburn or change in bowel habits Skin: Denies abnormal skin rashes Lymphatics: Denies new lymphadenopathy or easy bruising Neurological:Denies numbness, tingling or new weaknesses Behavioral/Psych: Mood is stable, no new changes  All other systems were reviewed with the patient and are negative.  PHYSICAL EXAMINATION: ECOG PERFORMANCE STATUS: 0 - Asymptomatic  Filed  Vitals:   03/07/15 0949  BP: 153/72  Pulse: 85  Temp: 98 F (36.7 C)  Resp: 18   Filed Weights   03/07/15 0949  Weight: 191 lb 3.2 oz (86.728 kg)    GENERAL:alert, no distress and comfortable SKIN: skin color, texture, turgor are normal, no rashes or significant lesions EYES: normal, Conjunctiva are pink and non-injected, sclera clear Musculoskeletal:no cyanosis of digits and no clubbing  NEURO: alert & oriented x 3 with fluent speech, no focal motor/sensory deficits  LABORATORY DATA:  I have reviewed the data as listed Results for orders placed or performed in visit on 03/07/15 (from the past 48 hour(s))  CBC with Differential/Platelet     Status: Abnormal   Collection Time: 03/07/15  9:38 AM  Result Value Ref Range   WBC 4.0 4.0 - 10.3 10e3/uL   NEUT# 2.1 1.5 - 6.5 10e3/uL   HGB 15.7 13.0 - 17.1 g/dL   HCT 47.7 38.4 - 49.9 %   Platelets 167 140 - 400 10e3/uL   MCV 76.6 (L) 79.3 - 98.0 fL   MCH 25.2 (L) 27.2 - 33.4 pg   MCHC 32.9 32.0 - 36.0 g/dL   RBC 6.23 (H) 4.20 - 5.82 10e6/uL   RDW 15.6 (H) 11.0 - 14.6 %   lymph# 1.5 0.9 - 3.3 10e3/uL   MONO# 0.4 0.1 - 0.9 10e3/uL   Eosinophils Absolute 0.0 0.0 - 0.5 10e3/uL   Basophils Absolute 0.0 0.0 - 0.1 10e3/uL   NEUT% 51.9 39.0 - 75.0 %   LYMPH% 36.9 14.0 - 49.0 %   MONO% 10.4 0.0 - 14.0 %   EOS% 0.8 0.0 - 7.0 %   BASO% 0.0 0.0 - 2.0 %  nRBC 0 0 - 0 %    Lab Results  Component Value Date   WBC 4.0 03/07/2015   HGB 15.7 03/07/2015   HCT 47.7 03/07/2015   MCV 76.6* 03/07/2015   PLT 167 03/07/2015    ASSESSMENT & PLAN:  Polycythemia The patient has been tested extensively. Overall picture is not consistent with myeloproliferative disorder. JAK2 mutation and serum erythropoietin were within normal limits. I suspect there may be a component of undiagnosed obstructive sleep apnea. I recommend phlebotomy one unit of blood every 6 weeks to keep hematocrit under 45%. In the meantime, he will continue aspirin therapy to  prevent risk of blood clot. The risks, benefit, side effects of phlebotomy is discussed with him and his wife and he agreed to proceed.  Essential hypertension he will continue current medical management. I recommend close follow-up with primary care doctor for medication adjustment.   History of prostate cancer According to the patient, he did well after surgery. He is being monitored by urologist with PSA surveillance monitoring on a routine basis.     All questions were answered. The patient knows to call the clinic with any problems, questions or concerns. No barriers to learning was detected.  I spent 15 minutes counseling the patient face to face. The total time spent in the appointment was 20 minutes and more than 50% was on counseling.     [facility], Dmarius Reeder, MD 9/6/20169:58 AM

## 2015-03-07 NOTE — Assessment & Plan Note (Signed)
he will continue current medical management. I recommend close follow-up with primary care doctor for medication adjustment.  

## 2015-03-07 NOTE — Telephone Encounter (Signed)
Per staff message and POF I have scheduled appts. Advised scheduler of appts. JMW  

## 2015-03-07 NOTE — Assessment & Plan Note (Signed)
According to the patient, he did well after surgery. He is being monitored by urologist with PSA surveillance monitoring on a routine basis. 

## 2015-04-06 ENCOUNTER — Other Ambulatory Visit: Payer: Self-pay | Admitting: Hematology and Oncology

## 2015-04-06 ENCOUNTER — Other Ambulatory Visit (HOSPITAL_BASED_OUTPATIENT_CLINIC_OR_DEPARTMENT_OTHER): Payer: Medicare Other

## 2015-04-06 ENCOUNTER — Ambulatory Visit (HOSPITAL_BASED_OUTPATIENT_CLINIC_OR_DEPARTMENT_OTHER): Payer: Medicare Other

## 2015-04-06 VITALS — BP 146/63 | HR 75 | Temp 97.6°F | Resp 18

## 2015-04-06 DIAGNOSIS — D751 Secondary polycythemia: Secondary | ICD-10-CM

## 2015-04-06 LAB — CBC WITH DIFFERENTIAL/PLATELET
BASO%: 0.3 % (ref 0.0–2.0)
BASOS ABS: 0 10*3/uL (ref 0.0–0.1)
EOS%: 1.4 % (ref 0.0–7.0)
Eosinophils Absolute: 0.1 10*3/uL (ref 0.0–0.5)
HCT: 49.9 % (ref 38.4–49.9)
HGB: 16.2 g/dL (ref 13.0–17.1)
LYMPH#: 1.6 10*3/uL (ref 0.9–3.3)
LYMPH%: 46 % (ref 14.0–49.0)
MCH: 25 pg — AB (ref 27.2–33.4)
MCHC: 32.5 g/dL (ref 32.0–36.0)
MCV: 77.1 fL — ABNORMAL LOW (ref 79.3–98.0)
MONO#: 0.3 10*3/uL (ref 0.1–0.9)
MONO%: 9.2 % (ref 0.0–14.0)
NEUT#: 1.5 10*3/uL (ref 1.5–6.5)
NEUT%: 43.1 % (ref 39.0–75.0)
PLATELETS: 147 10*3/uL (ref 140–400)
RBC: 6.47 10*6/uL — ABNORMAL HIGH (ref 4.20–5.82)
RDW: 15.8 % — ABNORMAL HIGH (ref 11.0–14.6)
WBC: 3.5 10*3/uL — ABNORMAL LOW (ref 4.0–10.3)

## 2015-04-06 NOTE — Patient Instructions (Signed)
Therapeutic Phlebotomy, Care After  Refer to this sheet in the next few weeks. These instructions provide you with information about caring for yourself after your procedure. Your health care provider may also give you more specific instructions. Your treatment has been planned according to current medical practices, but problems sometimes occur. Call your health care provider if you have any problems or questions after your procedure.  WHAT TO EXPECT AFTER THE PROCEDURE  After your procedure, it is common to have:   Light-headedness or dizziness. You may feel faint.   Nausea.   Tiredness.  HOME CARE INSTRUCTIONS  Activities   Return to your normal activities as directed by your health care provider. Most people can go back to their normal activities right away.   Avoid strenuous physical activity and heavy lifting or pulling for about 5 hours after the procedure. Do not lift anything that is heavier than 10 lb (4.5 kg).   Athletes should avoid strenuous exercise for at least 12 hours.   Change positions slowly for the remainder of the day. This will help to prevent light-headedness or fainting.   If you feel light-headed, lie down until the feeling goes away.  Eating and Drinking   Be sure to eat well-balanced meals for the next 24 hours.   Drink enough fluid to keep your urine clear or pale yellow.   Avoid drinking alcohol on the day that you had the procedure.  Care of the Needle Insertion Site   Keep your bandage dry. You can remove the bandage after about 5 hours or as directed by your health care provider.   If you have bleeding from the needle insertion site, elevate your arm and press firmly on the site until the bleeding stops.   If you have bruising at the site, apply ice to the area:   Put ice in a plastic bag.   Place a towel between your skin and the bag.   Leave the ice on for 20 minutes, 2-3 times a day for the first 24 hours.   If the swelling does not go away after 24 hours, apply  a warm, moist washcloth to the area for 20 minutes, 2-3 times a day.  General Instructions   Avoid smoking for at least 30 minutes after the procedure.   Keep all follow-up visits as directed by your health care provider. It is important to continue with further therapeutic phlebotomy treatments as directed.  SEEK MEDICAL CARE IF:   You have redness, swelling, or pain at the needle insertion site.   You have fluid, blood, or pus coming from the needle insertion site.   You feel light-headed, dizzy, or nauseated, and the feeling does not go away.   You notice new bruising at the needle insertion site.   You feel weaker than normal.   You have a fever or chills.  SEEK IMMEDIATE MEDICAL CARE IF:   You have severe nausea or vomiting.   You have chest pain.   You have trouble breathing.    This information is not intended to replace advice given to you by your health care provider. Make sure you discuss any questions you have with your health care provider.    Document Released: 11/19/2010 Document Revised: 11/01/2014 Document Reviewed: 06/13/2014  Elsevier Interactive Patient Education 2016 [employer]

## 2015-04-06 NOTE — Progress Notes (Signed)
Phlebotomy performed using 16 gauge Phlebotomy set using Lt AC. 536 Grams obtained starting at 1025 and ending at 1045. Pt to be monitored 30 minutes post procedure.

## 2015-04-06 NOTE — Progress Notes (Signed)
1116:Pt and VS stable at time of discharge. Print out of schedule given to pt.

## 2015-05-18 ENCOUNTER — Ambulatory Visit (HOSPITAL_BASED_OUTPATIENT_CLINIC_OR_DEPARTMENT_OTHER): Payer: Medicare Other

## 2015-05-18 ENCOUNTER — Other Ambulatory Visit (HOSPITAL_BASED_OUTPATIENT_CLINIC_OR_DEPARTMENT_OTHER): Payer: Medicare Other

## 2015-05-18 VITALS — BP 152/76 | HR 73 | Temp 97.1°F | Resp 18

## 2015-05-18 DIAGNOSIS — D751 Secondary polycythemia: Secondary | ICD-10-CM | POA: Diagnosis present

## 2015-05-18 LAB — CBC WITH DIFFERENTIAL/PLATELET
BASO%: 0.3 % (ref 0.0–2.0)
Basophils Absolute: 0 10*3/uL (ref 0.0–0.1)
EOS ABS: 0.1 10*3/uL (ref 0.0–0.5)
EOS%: 2.1 % (ref 0.0–7.0)
HEMATOCRIT: 46.9 % (ref 38.4–49.9)
HEMOGLOBIN: 15.1 g/dL (ref 13.0–17.1)
LYMPH#: 1.9 10*3/uL (ref 0.9–3.3)
LYMPH%: 52 % — ABNORMAL HIGH (ref 14.0–49.0)
MCH: 24.9 pg — ABNORMAL LOW (ref 27.2–33.4)
MCHC: 32.2 g/dL (ref 32.0–36.0)
MCV: 77.3 fL — ABNORMAL LOW (ref 79.3–98.0)
MONO#: 0.3 10*3/uL (ref 0.1–0.9)
MONO%: 7.5 % (ref 0.0–14.0)
NEUT%: 38.1 % — ABNORMAL LOW (ref 39.0–75.0)
NEUTROS ABS: 1.4 10*3/uL — AB (ref 1.5–6.5)
PLATELETS: 146 10*3/uL (ref 140–400)
RBC: 6.07 10*6/uL — ABNORMAL HIGH (ref 4.20–5.82)
RDW: 16.5 % — ABNORMAL HIGH (ref 11.0–14.6)
WBC: 3.7 10*3/uL — AB (ref 4.0–10.3)

## 2015-05-18 NOTE — Patient Instructions (Signed)

## 2015-06-06 ENCOUNTER — Telehealth: Payer: Self-pay | Admitting: Hematology and Oncology

## 2015-06-06 NOTE — Telephone Encounter (Signed)
returned call and r/s appt per pt request....pt ok adn aware of new d.t

## 2015-06-29 ENCOUNTER — Other Ambulatory Visit: Payer: Medicare Other

## 2015-07-06 DIAGNOSIS — C61 Malignant neoplasm of prostate: Secondary | ICD-10-CM | POA: Diagnosis not present

## 2015-07-06 DIAGNOSIS — R350 Frequency of micturition: Secondary | ICD-10-CM | POA: Diagnosis not present

## 2015-07-06 DIAGNOSIS — N3944 Nocturnal enuresis: Secondary | ICD-10-CM | POA: Diagnosis not present

## 2015-07-06 DIAGNOSIS — N3941 Urge incontinence: Secondary | ICD-10-CM | POA: Diagnosis not present

## 2015-07-06 DIAGNOSIS — Z0000 Encounter for general adult medical examination without abnormal findings: Secondary | ICD-10-CM | POA: Diagnosis not present

## 2015-07-07 ENCOUNTER — Ambulatory Visit (HOSPITAL_BASED_OUTPATIENT_CLINIC_OR_DEPARTMENT_OTHER): Payer: Medicare Other

## 2015-07-07 VITALS — BP 117/59 | HR 82 | Temp 97.0°F | Resp 16

## 2015-07-07 DIAGNOSIS — D751 Secondary polycythemia: Secondary | ICD-10-CM

## 2015-07-07 LAB — CBC WITH DIFFERENTIAL/PLATELET
BASO%: 0.2 % (ref 0.0–2.0)
BASOS ABS: 0 10*3/uL (ref 0.0–0.1)
EOS ABS: 0.1 10*3/uL (ref 0.0–0.5)
EOS%: 1.2 % (ref 0.0–7.0)
HCT: 48.2 % (ref 38.4–49.9)
HGB: 15.8 g/dL (ref 13.0–17.1)
LYMPH%: 40.1 % (ref 14.0–49.0)
MCH: 24.9 pg — AB (ref 27.2–33.4)
MCHC: 32.8 g/dL (ref 32.0–36.0)
MCV: 76 fL — AB (ref 79.3–98.0)
MONO#: 0.5 10*3/uL (ref 0.1–0.9)
MONO%: 12.6 % (ref 0.0–14.0)
NEUT%: 45.9 % (ref 39.0–75.0)
NEUTROS ABS: 1.9 10*3/uL (ref 1.5–6.5)
PLATELETS: 161 10*3/uL (ref 140–400)
RBC: 6.34 10*6/uL — AB (ref 4.20–5.82)
RDW: 16 % — ABNORMAL HIGH (ref 11.0–14.6)
WBC: 4 10*3/uL (ref 4.0–10.3)
lymph#: 1.6 10*3/uL (ref 0.9–3.3)

## 2015-07-07 NOTE — Progress Notes (Signed)
1 unit phlebotomy to L AC without difficulty from [id number]. Drink provided. VSS. Patient monitored x30 minutes. Tolerated well. Discharged home ambulatory in no apparent distress.

## 2015-07-07 NOTE — Patient Instructions (Signed)

## 2015-07-24 DIAGNOSIS — N393 Stress incontinence (female) (male): Secondary | ICD-10-CM | POA: Diagnosis not present

## 2015-07-24 DIAGNOSIS — Z0000 Encounter for general adult medical examination without abnormal findings: Secondary | ICD-10-CM | POA: Diagnosis not present

## 2015-07-24 DIAGNOSIS — C61 Malignant neoplasm of prostate: Secondary | ICD-10-CM | POA: Diagnosis not present

## 2015-08-09 ENCOUNTER — Telehealth: Payer: Self-pay | Admitting: Hematology and Oncology

## 2015-08-09 NOTE — Telephone Encounter (Signed)
Patient called and r/s 2/9 lab/phleb to 2/14. Patient has new date/time.

## 2015-08-10 ENCOUNTER — Other Ambulatory Visit: Payer: Medicare Other

## 2015-08-10 DIAGNOSIS — N3944 Nocturnal enuresis: Secondary | ICD-10-CM | POA: Diagnosis not present

## 2015-08-10 DIAGNOSIS — N393 Stress incontinence (female) (male): Secondary | ICD-10-CM | POA: Diagnosis not present

## 2015-08-10 DIAGNOSIS — R3915 Urgency of urination: Secondary | ICD-10-CM | POA: Diagnosis not present

## 2015-08-10 DIAGNOSIS — Z0000 Encounter for general adult medical examination without abnormal findings: Secondary | ICD-10-CM | POA: Diagnosis not present

## 2015-08-10 DIAGNOSIS — N3941 Urge incontinence: Secondary | ICD-10-CM | POA: Diagnosis not present

## 2015-08-15 ENCOUNTER — Other Ambulatory Visit (HOSPITAL_BASED_OUTPATIENT_CLINIC_OR_DEPARTMENT_OTHER): Payer: Medicare Other

## 2015-08-15 ENCOUNTER — Ambulatory Visit (HOSPITAL_BASED_OUTPATIENT_CLINIC_OR_DEPARTMENT_OTHER): Payer: Medicare Other

## 2015-08-15 VITALS — BP 127/60 | HR 89 | Temp 98.2°F | Resp 18

## 2015-08-15 DIAGNOSIS — D751 Secondary polycythemia: Secondary | ICD-10-CM

## 2015-08-15 LAB — CBC WITH DIFFERENTIAL/PLATELET
BASO%: 0.9 % (ref 0.0–2.0)
BASOS ABS: 0 10*3/uL (ref 0.0–0.1)
EOS%: 0.8 % (ref 0.0–7.0)
Eosinophils Absolute: 0 10*3/uL (ref 0.0–0.5)
HCT: 49.6 % (ref 38.4–49.9)
HEMOGLOBIN: 15.5 g/dL (ref 13.0–17.1)
LYMPH%: 32.2 % (ref 14.0–49.0)
MCH: 23.8 pg — AB (ref 27.2–33.4)
MCHC: 31.3 g/dL — AB (ref 32.0–36.0)
MCV: 76 fL — ABNORMAL LOW (ref 79.3–98.0)
MONO#: 0.5 10*3/uL (ref 0.1–0.9)
MONO%: 10 % (ref 0.0–14.0)
NEUT#: 2.6 10*3/uL (ref 1.5–6.5)
NEUT%: 56.1 % (ref 39.0–75.0)
Platelets: 167 10*3/uL (ref 140–400)
RBC: 6.52 10*6/uL — ABNORMAL HIGH (ref 4.20–5.82)
RDW: 16 % — AB (ref 11.0–14.6)
WBC: 4.7 10*3/uL (ref 4.0–10.3)
lymph#: 1.5 10*3/uL (ref 0.9–3.3)

## 2015-08-15 NOTE — Progress Notes (Signed)
Therapeutic phlebotomy performed per MD order for hematocrit of 49.6 starting at 1045 and ending at 1050 removing total of 518cc. Pt tolerated procedure well.

## 2015-08-17 DIAGNOSIS — N3941 Urge incontinence: Secondary | ICD-10-CM | POA: Diagnosis not present

## 2015-09-21 ENCOUNTER — Ambulatory Visit (HOSPITAL_BASED_OUTPATIENT_CLINIC_OR_DEPARTMENT_OTHER): Payer: Medicare Other

## 2015-09-21 ENCOUNTER — Ambulatory Visit (HOSPITAL_BASED_OUTPATIENT_CLINIC_OR_DEPARTMENT_OTHER): Payer: Medicare Other | Admitting: Hematology and Oncology

## 2015-09-21 ENCOUNTER — Telehealth: Payer: Self-pay | Admitting: Hematology and Oncology

## 2015-09-21 ENCOUNTER — Encounter: Payer: Self-pay | Admitting: Hematology and Oncology

## 2015-09-21 ENCOUNTER — Other Ambulatory Visit (HOSPITAL_BASED_OUTPATIENT_CLINIC_OR_DEPARTMENT_OTHER): Payer: Medicare Other

## 2015-09-21 ENCOUNTER — Other Ambulatory Visit: Payer: Self-pay | Admitting: Hematology and Oncology

## 2015-09-21 VITALS — BP 144/78 | HR 80 | Temp 97.3°F | Resp 18 | Ht 68.0 in | Wt 199.9 lb

## 2015-09-21 DIAGNOSIS — Z8546 Personal history of malignant neoplasm of prostate: Secondary | ICD-10-CM | POA: Diagnosis not present

## 2015-09-21 DIAGNOSIS — I10 Essential (primary) hypertension: Secondary | ICD-10-CM

## 2015-09-21 DIAGNOSIS — D751 Secondary polycythemia: Secondary | ICD-10-CM | POA: Diagnosis not present

## 2015-09-21 LAB — CBC WITH DIFFERENTIAL/PLATELET
BASO%: 0.6 % (ref 0.0–2.0)
BASOS ABS: 0 10*3/uL (ref 0.0–0.1)
EOS ABS: 0 10*3/uL (ref 0.0–0.5)
EOS%: 1 % (ref 0.0–7.0)
HEMATOCRIT: 48.6 % (ref 38.4–49.9)
HEMOGLOBIN: 15.3 g/dL (ref 13.0–17.1)
LYMPH#: 1.6 10*3/uL (ref 0.9–3.3)
LYMPH%: 40 % (ref 14.0–49.0)
MCH: 23.8 pg — ABNORMAL LOW (ref 27.2–33.4)
MCHC: 31.4 g/dL — ABNORMAL LOW (ref 32.0–36.0)
MCV: 75.7 fL — AB (ref 79.3–98.0)
MONO#: 0.5 10*3/uL (ref 0.1–0.9)
MONO%: 11.9 % (ref 0.0–14.0)
NEUT#: 1.8 10*3/uL (ref 1.5–6.5)
NEUT%: 46.5 % (ref 39.0–75.0)
Platelets: 169 10*3/uL (ref 140–400)
RBC: 6.43 10*6/uL — ABNORMAL HIGH (ref 4.20–5.82)
RDW: 16.3 % — ABNORMAL HIGH (ref 11.0–14.6)
WBC: 3.9 10*3/uL — ABNORMAL LOW (ref 4.0–10.3)

## 2015-09-21 NOTE — Assessment & Plan Note (Signed)
The patient has been tested extensively. Overall picture is not consistent with myeloproliferative disorder. JAK2 mutation and serum erythropoietin were within normal limits. I suspect there may be a component of undiagnosed obstructive sleep apnea. I recommend phlebotomy one unit of blood every 8 weeks to keep hematocrit under 48%. In the meantime, he will continue aspirin therapy to prevent risk of blood clot. The risks, benefit, side effects of phlebotomy is discussed with him and his wife and he agreed to proceed.

## 2015-09-21 NOTE — Progress Notes (Signed)
[name] OFFICE PROGRESS NOTE  [name], MD SUMMARY OF HEMATOLOGIC HISTORY:  This is a pleasant gentleman who was found to have abnormal CBC with polycythemia In 2014 he had a bone marrow aspirate and biopsy that came back nondiagnostic for myeloproliferative disorder. JAK 2 mutation testing was negative. The patient had intermittent phlebotomy sessions and remain on aspirin INTERVAL HISTORY: Cory [name] 80 y.o. male returns for further follow-up. He feels well. No recent diagnosis of blood clots. He tolerated phlebotomy well  I have reviewed the past medical history, past surgical history, social history and family history with the patient and they are unchanged from previous note.  ALLERGIES:  has No Known Allergies.  MEDICATIONS:  Current Outpatient Prescriptions  Medication Sig Dispense Refill  . aspirin EC 81 MG tablet Take 81 mg by mouth daily.    Marland Kitchen atorvastatin (LIPITOR) 40 MG tablet Take 40 mg by mouth daily.    . benazepril-hydrochlorthiazide (LOTENSIN HCT) 20-12.5 MG per tablet Take 1 tablet by mouth every morning.    . ezetimibe (ZETIA) 10 MG tablet Take 10 mg by mouth every morning.     No current facility-administered medications for this visit.     REVIEW OF SYSTEMS:   Constitutional: Denies fevers, chills or night sweats Eyes: Denies blurriness of vision Ears, nose, mouth, throat, and face: Denies mucositis or sore throat Respiratory: Denies cough, dyspnea or wheezes Cardiovascular: Denies palpitation, chest discomfort or lower extremity swelling Gastrointestinal:  Denies nausea, heartburn or change in bowel habits Skin: Denies abnormal skin rashes Lymphatics: Denies new lymphadenopathy or easy bruising Neurological:Denies numbness, tingling or new weaknesses Behavioral/Psych: Mood is stable, no new changes  All other systems were reviewed with the patient and are negative.  PHYSICAL EXAMINATION: ECOG PERFORMANCE STATUS: 0 -  Asymptomatic  Filed Vitals:   09/21/15 1009  BP: 144/78  Pulse: 80  Temp: 97.3 F (36.3 C)  Resp: 18   Filed Weights   09/21/15 1009  Weight: 199 lb 14.4 oz (90.674 kg)    GENERAL:alert, no distress and comfortable SKIN: skin color, texture, turgor are normal, no rashes or significant lesions EYES: normal, Conjunctiva are pink and non-injected, sclera clear Musculoskeletal:no cyanosis of digits and no clubbing  NEURO: alert & oriented x 3 with fluent speech, no focal motor/sensory deficits  LABORATORY DATA:  I have reviewed the data as listed Results for orders placed or performed in visit on 09/21/15 (from the past 48 hour(s))  CBC with Differential     Status: Abnormal   Collection Time: 09/21/15  9:47 AM  Result Value Ref Range   WBC 3.9 (L) 4.0 - 10.3 10e3/uL   NEUT# 1.8 1.5 - 6.5 10e3/uL   HGB 15.3 13.0 - 17.1 g/dL   HCT 48.6 38.4 - 49.9 %   Platelets 169 140 - 400 10e3/uL   MCV 75.7 (L) 79.3 - 98.0 fL   MCH 23.8 (L) 27.2 - 33.4 pg   MCHC 31.4 (L) 32.0 - 36.0 g/dL   RBC 6.43 (H) 4.20 - 5.82 10e6/uL   RDW 16.3 (H) 11.0 - 14.6 %   lymph# 1.6 0.9 - 3.3 10e3/uL   MONO# 0.5 0.1 - 0.9 10e3/uL   Eosinophils Absolute 0.0 0.0 - 0.5 10e3/uL   Basophils Absolute 0.0 0.0 - 0.1 10e3/uL   NEUT% 46.5 39.0 - 75.0 %   LYMPH% 40.0 14.0 - 49.0 %   MONO% 11.9 0.0 - 14.0 %   EOS% 1.0 0.0 - 7.0 %   BASO%  0.6 0.0 - 2.0 %    Lab Results  Component Value Date   WBC 3.9* 09/21/2015   HGB 15.3 09/21/2015   HCT 48.6 09/21/2015   MCV 75.7* 09/21/2015   PLT 169 09/21/2015    ASSESSMENT & PLAN:  Polycythemia The patient has been tested extensively. Overall picture is not consistent with myeloproliferative disorder. JAK2 mutation and serum erythropoietin were within normal limits. I suspect there may be a component of undiagnosed obstructive sleep apnea. I recommend phlebotomy one unit of blood every 8 weeks to keep hematocrit under 48%. In the meantime, he will continue aspirin  therapy to prevent risk of blood clot. The risks, benefit, side effects of phlebotomy is discussed with him and his wife and he agreed to proceed.  Essential hypertension he will continue current medical management. I recommend close follow-up with primary care doctor for medication adjustment.   History of prostate cancer According to the patient, he did well after surgery. He is being monitored by urologist with PSA surveillance monitoring on a routine basis.     All questions were answered. The patient knows to call the clinic with any problems, questions or concerns. No barriers to learning was detected.  I spent 15 minutes counseling the patient face to face. The total time spent in the appointment was 20 minutes and more than 50% was on counseling.     [name], MD 3/23/201710:31 AM

## 2015-09-21 NOTE — Assessment & Plan Note (Signed)
According to the patient, he did well after surgery. He is being monitored by urologist with PSA surveillance monitoring on a routine basis. 

## 2015-09-21 NOTE — Progress Notes (Signed)
Pt refused to stay until 1155 -he left at 1145. VSS

## 2015-09-21 NOTE — Assessment & Plan Note (Signed)
he will continue current medical management. I recommend close follow-up with primary care doctor for medication adjustment.  

## 2015-09-21 NOTE — Telephone Encounter (Signed)
Gave and printed appt sched and avs for pt for May, July and Sept

## 2015-09-21 NOTE — Patient Instructions (Signed)
Therapeutic Phlebotomy, Care After  Refer to this sheet in the next few weeks. These instructions provide you with information about caring for yourself after your procedure. Your health care provider may also give you more specific instructions. Your treatment has been planned according to current medical practices, but problems sometimes occur. Call your health care provider if you have any problems or questions after your procedure.  WHAT TO EXPECT AFTER THE PROCEDURE  After your procedure, it is common to have:   Light-headedness or dizziness. You may feel faint.   Nausea.   Tiredness.  HOME CARE INSTRUCTIONS  Activities   Return to your normal activities as directed by your health care provider. Most people can go back to their normal activities right away.   Avoid strenuous physical activity and heavy lifting or pulling for about 5 hours after the procedure. Do not lift anything that is heavier than 10 lb (4.5 kg).   Athletes should avoid strenuous exercise for at least 12 hours.   Change positions slowly for the remainder of the day. This will help to prevent light-headedness or fainting.   If you feel light-headed, lie down until the feeling goes away.  Eating and Drinking   Be sure to eat well-balanced meals for the next 24 hours.   Drink enough fluid to keep your urine clear or pale yellow.   Avoid drinking alcohol on the day that you had the procedure.  Care of the Needle Insertion Site   Keep your bandage dry. You can remove the bandage after about 5 hours or as directed by your health care provider.   If you have bleeding from the needle insertion site, elevate your arm and press firmly on the site until the bleeding stops.   If you have bruising at the site, apply ice to the area:   Put ice in a plastic bag.   Place a towel between your skin and the bag.   Leave the ice on for 20 minutes, 2-3 times a day for the first 24 hours.   If the swelling does not go away after 24 hours, apply  a warm, moist washcloth to the area for 20 minutes, 2-3 times a day.  General Instructions   Avoid smoking for at least 30 minutes after the procedure.   Keep all follow-up visits as directed by your health care provider. It is important to continue with further therapeutic phlebotomy treatments as directed.  SEEK MEDICAL CARE IF:   You have redness, swelling, or pain at the needle insertion site.   You have fluid, blood, or pus coming from the needle insertion site.   You feel light-headed, dizzy, or nauseated, and the feeling does not go away.   You notice new bruising at the needle insertion site.   You feel weaker than normal.   You have a fever or chills.  SEEK IMMEDIATE MEDICAL CARE IF:   You have severe nausea or vomiting.   You have chest pain.   You have trouble breathing.    This information is not intended to replace advice given to you by your health care provider. Make sure you discuss any questions you have with your health care provider.    Document Released: 11/19/2010 Document Revised: 11/01/2014 Document Reviewed: 06/13/2014  Elsevier Interactive Patient Education 2016 [employer]

## 2015-10-16 DIAGNOSIS — R350 Frequency of micturition: Secondary | ICD-10-CM | POA: Diagnosis not present

## 2015-10-16 DIAGNOSIS — N3941 Urge incontinence: Secondary | ICD-10-CM | POA: Diagnosis not present

## 2015-10-16 DIAGNOSIS — N393 Stress incontinence (female) (male): Secondary | ICD-10-CM | POA: Diagnosis not present

## 2015-10-16 DIAGNOSIS — Z0000 Encounter for general adult medical examination without abnormal findings: Secondary | ICD-10-CM | POA: Diagnosis not present

## 2015-11-02 ENCOUNTER — Ambulatory Visit: Payer: Medicare Other | Admitting: Hematology and Oncology

## 2015-11-16 ENCOUNTER — Ambulatory Visit: Payer: Medicare Other

## 2015-11-16 ENCOUNTER — Other Ambulatory Visit (HOSPITAL_BASED_OUTPATIENT_CLINIC_OR_DEPARTMENT_OTHER): Payer: Medicare Other

## 2015-11-16 DIAGNOSIS — D751 Secondary polycythemia: Secondary | ICD-10-CM | POA: Diagnosis present

## 2015-11-16 LAB — CBC WITH DIFFERENTIAL/PLATELET
BASO%: 0.2 % (ref 0.0–2.0)
BASOS ABS: 0 10*3/uL (ref 0.0–0.1)
EOS ABS: 0 10*3/uL (ref 0.0–0.5)
EOS%: 0.7 % (ref 0.0–7.0)
HEMATOCRIT: 46.1 % (ref 38.4–49.9)
HEMOGLOBIN: 14.6 g/dL (ref 13.0–17.1)
LYMPH#: 1.7 10*3/uL (ref 0.9–3.3)
LYMPH%: 37.7 % (ref 14.0–49.0)
MCH: 23.2 pg — AB (ref 27.2–33.4)
MCHC: 31.7 g/dL — ABNORMAL LOW (ref 32.0–36.0)
MCV: 73.2 fL — AB (ref 79.3–98.0)
MONO#: 0.4 10*3/uL (ref 0.1–0.9)
MONO%: 8.7 % (ref 0.0–14.0)
NEUT#: 2.3 10*3/uL (ref 1.5–6.5)
NEUT%: 52.7 % (ref 39.0–75.0)
Platelets: 181 10*3/uL (ref 140–400)
RBC: 6.3 10*6/uL — ABNORMAL HIGH (ref 4.20–5.82)
RDW: 16.2 % — AB (ref 11.0–14.6)
WBC: 4.4 10*3/uL (ref 4.0–10.3)

## 2015-11-16 NOTE — Progress Notes (Signed)
Hct 46.1, no need for phlebotomy today. Spoke with patient in lobby and given copy of labs. Patient verbalized understanding.

## 2015-12-19 DIAGNOSIS — H2513 Age-related nuclear cataract, bilateral: Secondary | ICD-10-CM | POA: Diagnosis not present

## 2015-12-19 DIAGNOSIS — H11153 Pinguecula, bilateral: Secondary | ICD-10-CM | POA: Diagnosis not present

## 2015-12-19 DIAGNOSIS — H40013 Open angle with borderline findings, low risk, bilateral: Secondary | ICD-10-CM | POA: Diagnosis not present

## 2015-12-19 DIAGNOSIS — H11043 Peripheral pterygium, stationary, bilateral: Secondary | ICD-10-CM | POA: Diagnosis not present

## 2015-12-26 DIAGNOSIS — Z0000 Encounter for general adult medical examination without abnormal findings: Secondary | ICD-10-CM | POA: Diagnosis not present

## 2015-12-26 DIAGNOSIS — M1990 Unspecified osteoarthritis, unspecified site: Secondary | ICD-10-CM | POA: Diagnosis not present

## 2015-12-26 DIAGNOSIS — N5201 Erectile dysfunction due to arterial insufficiency: Secondary | ICD-10-CM | POA: Diagnosis not present

## 2015-12-26 DIAGNOSIS — I10 Essential (primary) hypertension: Secondary | ICD-10-CM | POA: Diagnosis not present

## 2015-12-26 DIAGNOSIS — C61 Malignant neoplasm of prostate: Secondary | ICD-10-CM | POA: Diagnosis not present

## 2015-12-26 DIAGNOSIS — G459 Transient cerebral ischemic attack, unspecified: Secondary | ICD-10-CM | POA: Diagnosis not present

## 2015-12-26 DIAGNOSIS — D751 Secondary polycythemia: Secondary | ICD-10-CM | POA: Diagnosis not present

## 2015-12-26 DIAGNOSIS — E7800 Pure hypercholesterolemia, unspecified: Secondary | ICD-10-CM | POA: Diagnosis not present

## 2016-01-11 ENCOUNTER — Other Ambulatory Visit (HOSPITAL_BASED_OUTPATIENT_CLINIC_OR_DEPARTMENT_OTHER): Payer: Medicare Other

## 2016-01-11 ENCOUNTER — Ambulatory Visit (HOSPITAL_BASED_OUTPATIENT_CLINIC_OR_DEPARTMENT_OTHER): Payer: Medicare Other

## 2016-01-11 VITALS — BP 130/74 | HR 85 | Temp 98.4°F | Resp 18

## 2016-01-11 DIAGNOSIS — D751 Secondary polycythemia: Secondary | ICD-10-CM

## 2016-01-11 LAB — CBC WITH DIFFERENTIAL/PLATELET
BASO%: 0.2 % (ref 0.0–2.0)
BASOS ABS: 0 10*3/uL (ref 0.0–0.1)
EOS%: 1.2 % (ref 0.0–7.0)
Eosinophils Absolute: 0.1 10*3/uL (ref 0.0–0.5)
HCT: 48.3 % (ref 38.4–49.9)
HGB: 16.1 g/dL (ref 13.0–17.1)
LYMPH%: 44.3 % (ref 14.0–49.0)
MCH: 24 pg — AB (ref 27.2–33.4)
MCHC: 33.3 g/dL (ref 32.0–36.0)
MCV: 72 fL — AB (ref 79.3–98.0)
MONO#: 0.5 10*3/uL (ref 0.1–0.9)
MONO%: 11.5 % (ref 0.0–14.0)
NEUT#: 1.9 10*3/uL (ref 1.5–6.5)
NEUT%: 42.8 % (ref 39.0–75.0)
Platelets: 146 10*3/uL (ref 140–400)
RBC: 6.71 10*6/uL — AB (ref 4.20–5.82)
RDW: 18.4 % — ABNORMAL HIGH (ref 11.0–14.6)
WBC: 4.3 10*3/uL (ref 4.0–10.3)
lymph#: 1.9 10*3/uL (ref 0.9–3.3)
nRBC: 0 % (ref 0–0)

## 2016-01-11 NOTE — Progress Notes (Signed)
[id number] Pt hct 48.3%. Per Dr. Alvy Bimler treatment parameters, performed phlebotomy treatment today using 16g needle LAC. Pt had total blood output of 560cc. Provided pt with hydration and nutrition. Pt asymptomatic throughout treatment. Pt stayed for 30 min post observation and vss upon discharge.

## 2016-01-11 NOTE — Patient Instructions (Signed)
Therapeutic Phlebotomy, Care After  Refer to this sheet in the next few weeks. These instructions provide you with information about caring for yourself after your procedure. Your health care provider may also give you more specific instructions. Your treatment has been planned according to current medical practices, but problems sometimes occur. Call your health care provider if you have any problems or questions after your procedure.  WHAT TO EXPECT AFTER THE PROCEDURE  After your procedure, it is common to have:   Light-headedness or dizziness. You may feel faint.   Nausea.   Tiredness.  HOME CARE INSTRUCTIONS  Activities   Return to your normal activities as directed by your health care provider. Most people can go back to their normal activities right away.   Avoid strenuous physical activity and heavy lifting or pulling for about 5 hours after the procedure. Do not lift anything that is heavier than 10 lb (4.5 kg).   Athletes should avoid strenuous exercise for at least 12 hours.   Change positions slowly for the remainder of the day. This will help to prevent light-headedness or fainting.   If you feel light-headed, lie down until the feeling goes away.  Eating and Drinking   Be sure to eat well-balanced meals for the next 24 hours.   Drink enough fluid to keep your urine clear or pale yellow.   Avoid drinking alcohol on the day that you had the procedure.  Care of the Needle Insertion Site   Keep your bandage dry. You can remove the bandage after about 5 hours or as directed by your health care provider.   If you have bleeding from the needle insertion site, elevate your arm and press firmly on the site until the bleeding stops.   If you have bruising at the site, apply ice to the area:   Put ice in a plastic bag.   Place a towel between your skin and the bag.   Leave the ice on for 20 minutes, 2-3 times a day for the first 24 hours.   If the swelling does not go away after 24 hours, apply  a warm, moist washcloth to the area for 20 minutes, 2-3 times a day.  General Instructions   Avoid smoking for at least 30 minutes after the procedure.   Keep all follow-up visits as directed by your health care provider. It is important to continue with further therapeutic phlebotomy treatments as directed.  SEEK MEDICAL CARE IF:   You have redness, swelling, or pain at the needle insertion site.   You have fluid, blood, or pus coming from the needle insertion site.   You feel light-headed, dizzy, or nauseated, and the feeling does not go away.   You notice new bruising at the needle insertion site.   You feel weaker than normal.   You have a fever or chills.  SEEK IMMEDIATE MEDICAL CARE IF:   You have severe nausea or vomiting.   You have chest pain.   You have trouble breathing.    This information is not intended to replace advice given to you by your health care provider. Make sure you discuss any questions you have with your health care provider.    Document Released: 11/19/2010 Document Revised: 11/01/2014 Document Reviewed: 06/13/2014  Elsevier Interactive Patient Education 2016 [employer]

## 2016-01-11 NOTE — Addendum Note (Signed)
Addended by: Thelma Barge MAY J on: 01/11/2016 01:53 PM   Modules accepted: Orders

## 2016-03-11 ENCOUNTER — Encounter: Payer: Self-pay | Admitting: Hematology and Oncology

## 2016-03-11 ENCOUNTER — Telehealth: Payer: Self-pay | Admitting: *Deleted

## 2016-03-11 ENCOUNTER — Ambulatory Visit (HOSPITAL_BASED_OUTPATIENT_CLINIC_OR_DEPARTMENT_OTHER): Payer: Medicare Other | Admitting: Hematology and Oncology

## 2016-03-11 ENCOUNTER — Telehealth: Payer: Self-pay | Admitting: Hematology and Oncology

## 2016-03-11 ENCOUNTER — Other Ambulatory Visit (HOSPITAL_BASED_OUTPATIENT_CLINIC_OR_DEPARTMENT_OTHER): Payer: Medicare Other

## 2016-03-11 ENCOUNTER — Ambulatory Visit (HOSPITAL_BASED_OUTPATIENT_CLINIC_OR_DEPARTMENT_OTHER): Payer: Medicare Other

## 2016-03-11 VITALS — BP 129/93 | HR 75 | Temp 98.4°F

## 2016-03-11 DIAGNOSIS — D751 Secondary polycythemia: Secondary | ICD-10-CM

## 2016-03-11 DIAGNOSIS — I10 Essential (primary) hypertension: Secondary | ICD-10-CM | POA: Diagnosis not present

## 2016-03-11 DIAGNOSIS — Z8546 Personal history of malignant neoplasm of prostate: Secondary | ICD-10-CM

## 2016-03-11 LAB — CBC WITH DIFFERENTIAL/PLATELET
BASO%: 0.5 % (ref 0.0–2.0)
Basophils Absolute: 0 10*3/uL (ref 0.0–0.1)
EOS%: 0.9 % (ref 0.0–7.0)
Eosinophils Absolute: 0 10*3/uL (ref 0.0–0.5)
HCT: 47.6 % (ref 38.4–49.9)
HEMOGLOBIN: 15.5 g/dL (ref 13.0–17.1)
LYMPH#: 1.8 10*3/uL (ref 0.9–3.3)
LYMPH%: 41.1 % (ref 14.0–49.0)
MCH: 24.1 pg — ABNORMAL LOW (ref 27.2–33.4)
MCHC: 32.6 g/dL (ref 32.0–36.0)
MCV: 74.1 fL — ABNORMAL LOW (ref 79.3–98.0)
MONO#: 0.5 10*3/uL (ref 0.1–0.9)
MONO%: 12.2 % (ref 0.0–14.0)
NEUT%: 45.3 % (ref 39.0–75.0)
NEUTROS ABS: 2 10*3/uL (ref 1.5–6.5)
PLATELETS: 163 10*3/uL (ref 140–400)
RBC: 6.42 10*6/uL — AB (ref 4.20–5.82)
RDW: 18 % — AB (ref 11.0–14.6)
WBC: 4.4 10*3/uL (ref 4.0–10.3)

## 2016-03-11 NOTE — Progress Notes (Addendum)
Therapeutic Phlebotomy performed with 18G in right forearm at 1049, yielding 498g and ending at 1100. Patient tolerated well. Nourishment offered, drink provided. Pt left after 15 minutes of observation. V/S obtained - see flowsheets

## 2016-03-11 NOTE — Telephone Encounter (Signed)
Per LOS I have scheduled appts. Notified the scheduler

## 2016-03-11 NOTE — Telephone Encounter (Signed)
Message sent to chemo scheduler to add phlebotomy. Avs report and schd given per 03/11/16 los

## 2016-03-11 NOTE — Progress Notes (Signed)
Pt finished beverage and needed to go to work, reported feeling fine and declined other diet offerings and declined to stay the full 30 minute post observation. VS taken and he is alert and ambulatory.

## 2016-03-11 NOTE — Assessment & Plan Note (Signed)
According to the patient, he did well after surgery. He is being monitored by urologist with PSA surveillance monitoring on a routine basis. 

## 2016-03-11 NOTE — Assessment & Plan Note (Signed)
he will continue current medical management. I recommend close follow-up with primary care doctor for medication adjustment.  

## 2016-03-11 NOTE — Patient Instructions (Signed)
Therapeutic Phlebotomy, Care After  Refer to this sheet in the next few weeks. These instructions provide you with information about caring for yourself after your procedure. Your health care provider may also give you more specific instructions. Your treatment has been planned according to current medical practices, but problems sometimes occur. Call your health care provider if you have any problems or questions after your procedure.  WHAT TO EXPECT AFTER THE PROCEDURE  After your procedure, it is common to have:   Light-headedness or dizziness. You may feel faint.   Nausea.   Tiredness.  HOME CARE INSTRUCTIONS  Activities   Return to your normal activities as directed by your health care provider. Most people can go back to their normal activities right away.   Avoid strenuous physical activity and heavy lifting or pulling for about 5 hours after the procedure. Do not lift anything that is heavier than 10 lb (4.5 kg).   Athletes should avoid strenuous exercise for at least 12 hours.   Change positions slowly for the remainder of the day. This will help to prevent light-headedness or fainting.   If you feel light-headed, lie down until the feeling goes away.  Eating and Drinking   Be sure to eat well-balanced meals for the next 24 hours.   Drink enough fluid to keep your urine clear or pale yellow.   Avoid drinking alcohol on the day that you had the procedure.  Care of the Needle Insertion Site   Keep your bandage dry. You can remove the bandage after about 5 hours or as directed by your health care provider.   If you have bleeding from the needle insertion site, elevate your arm and press firmly on the site until the bleeding stops.   If you have bruising at the site, apply ice to the area:   Put ice in a plastic bag.   Place a towel between your skin and the bag.   Leave the ice on for 20 minutes, 2-3 times a day for the first 24 hours.   If the swelling does not go away after 24 hours, apply  a warm, moist washcloth to the area for 20 minutes, 2-3 times a day.  General Instructions   Avoid smoking for at least 30 minutes after the procedure.   Keep all follow-up visits as directed by your health care provider. It is important to continue with further therapeutic phlebotomy treatments as directed.  SEEK MEDICAL CARE IF:   You have redness, swelling, or pain at the needle insertion site.   You have fluid, blood, or pus coming from the needle insertion site.   You feel light-headed, dizzy, or nauseated, and the feeling does not go away.   You notice new bruising at the needle insertion site.   You feel weaker than normal.   You have a fever or chills.  SEEK IMMEDIATE MEDICAL CARE IF:   You have severe nausea or vomiting.   You have chest pain.   You have trouble breathing.    This information is not intended to replace advice given to you by your health care provider. Make sure you discuss any questions you have with your health care provider.    Document Released: 11/19/2010 Document Revised: 11/01/2014 Document Reviewed: 06/13/2014  Elsevier Interactive Patient Education 2016 [employer]

## 2016-03-11 NOTE — Assessment & Plan Note (Signed)
The patient has been tested extensively. Overall picture is not consistent with myeloproliferative disorder. JAK2 mutation and serum erythropoietin were within normal limits. I suspect there may be a component of undiagnosed obstructive sleep apnea. I recommend phlebotomy one unit of blood every 8 weeks to keep hematocrit under 48%. In the meantime, he will continue aspirin therapy to prevent risk of blood clot. The risks, benefit, side effects of phlebotomy is discussed with him and his wife and he agreed to proceed.

## 2016-03-11 NOTE — Progress Notes (Signed)
[facility] OFFICE PROGRESS NOTE  Cory Casco, MD SUMMARY OF HEMATOLOGIC HISTORY:  This is a pleasant gentleman who was found to have abnormal CBC with polycythemia In 2014 he had a bone marrow aspirate and biopsy that came back nondiagnostic for myeloproliferative disorder. JAK 2 mutation testing was negative. The patient had intermittent phlebotomy sessions and remain on aspirin INTERVAL HISTORY: Cory [name] 80 y.o. male returns for follow-up. He feels well. Denies recent chest pain or shortness of breath. No recent diagnosis of blood clots. He tolerated phlebotomy well. He follows closely with urologist for PSA monitoring. He denies urinary problems  I have reviewed the past medical history, past surgical history, social history and family history with the patient and they are unchanged from previous note.  ALLERGIES:  has No Known Allergies.  MEDICATIONS:  Current Outpatient Prescriptions  Medication Sig Dispense Refill  . aspirin EC 81 MG tablet Take 81 mg by mouth daily.    Marland Kitchen atorvastatin (LIPITOR) 40 MG tablet Take 40 mg by mouth daily.    . benazepril-hydrochlorthiazide (LOTENSIN HCT) 20-12.5 MG per tablet Take 1 tablet by mouth every morning.    . ezetimibe (ZETIA) 10 MG tablet Take 10 mg by mouth every morning.     No current facility-administered medications for this visit.      REVIEW OF SYSTEMS:   Constitutional: Denies fevers, chills or night sweats Eyes: Denies blurriness of vision Ears, nose, mouth, throat, and face: Denies mucositis or sore throat Respiratory: Denies cough, dyspnea or wheezes Cardiovascular: Denies palpitation, chest discomfort or lower extremity swelling Gastrointestinal:  Denies nausea, heartburn or change in bowel habits Skin: Denies abnormal skin rashes Lymphatics: Denies new lymphadenopathy or easy bruising Neurological:Denies numbness, tingling or new weaknesses Behavioral/Psych: Mood is stable, no new  changes  All other systems were reviewed with the patient and are negative.  PHYSICAL EXAMINATION: ECOG PERFORMANCE STATUS: 0 - Asymptomatic  Vitals:   03/11/16 0939  BP: (!) 158/71  Pulse: 69  Temp: 98.1 F (36.7 C)   Filed Weights   03/11/16 0939  Weight: 198 lb 1 oz (89.8 kg)    GENERAL:alert, no distress and comfortable SKIN: skin color, texture, turgor are normal, no rashes or significant lesions EYES: normal, Conjunctiva are pink and non-injected, sclera clear OROPHARYNX:no exudate, no erythema and lips, buccal mucosa, and tongue normal  NECK: supple, thyroid normal size, non-tender, without nodularity LYMPH:  no palpable lymphadenopathy in the cervical, axillary or inguinal LUNGS: clear to auscultation and percussion with normal breathing effort HEART: regular rate & rhythm and no murmurs and no lower extremity edema ABDOMEN:abdomen soft, non-tender and normal bowel sounds Musculoskeletal:no cyanosis of digits and no clubbing  NEURO: alert & oriented x 3 with fluent speech, no focal motor/sensory deficits  LABORATORY DATA:  I have reviewed the data as listed     Component Value Date/Time   NA 138 03/05/2014 0810   NA 144 03/03/2013 0944   K 3.5 (L) 03/05/2014 0810   K 3.6 03/03/2013 0944   CL 98 03/05/2014 0810   CL 106 08/04/2012 0957   CO2 27 03/05/2014 0810   CO2 26 03/03/2013 0944   GLUCOSE 115 (H) 03/05/2014 0810   GLUCOSE 132 03/03/2013 0944   GLUCOSE 108 (H) 08/04/2012 0957   BUN 20 03/05/2014 0810   BUN 19.6 03/03/2013 0944   CREATININE 1.08 03/05/2014 0810   CREATININE 1.2 03/03/2013 0944   CALCIUM 9.1 03/05/2014 0810   CALCIUM 9.3 03/03/2013 0944   PROT  6.9 03/03/2013 0944   ALBUMIN 3.9 03/03/2013 0944   AST 20 03/03/2013 0944   ALT 27 03/03/2013 0944   ALKPHOS 69 03/03/2013 0944   BILITOT 1.29 (H) 03/03/2013 0944   GFRNONAA 65 (L) 03/05/2014 0810   GFRAA 76 (L) 03/05/2014 0810    No results found for: SPEP, UPEP  Lab Results   Component Value Date   WBC 4.4 03/11/2016   NEUTROABS 2.0 03/11/2016   HGB 15.5 03/11/2016   HCT 47.6 03/11/2016   MCV 74.1 (L) 03/11/2016   PLT 163 03/11/2016      Chemistry      Component Value Date/Time   NA 138 03/05/2014 0810   NA 144 03/03/2013 0944   K 3.5 (L) 03/05/2014 0810   K 3.6 03/03/2013 0944   CL 98 03/05/2014 0810   CL 106 08/04/2012 0957   CO2 27 03/05/2014 0810   CO2 26 03/03/2013 0944   BUN 20 03/05/2014 0810   BUN 19.6 03/03/2013 0944   CREATININE 1.08 03/05/2014 0810   CREATININE 1.2 03/03/2013 0944      Component Value Date/Time   CALCIUM 9.1 03/05/2014 0810   CALCIUM 9.3 03/03/2013 0944   ALKPHOS 69 03/03/2013 0944   AST 20 03/03/2013 0944   ALT 27 03/03/2013 0944   BILITOT 1.29 (H) 03/03/2013 0944     ASSESSMENT & PLAN:  Polycythemia The patient has been tested extensively. Overall picture is not consistent with myeloproliferative disorder. JAK2 mutation and serum erythropoietin were within normal limits. I suspect there may be a component of undiagnosed obstructive sleep apnea. I recommend phlebotomy one unit of blood every 8 weeks to keep hematocrit under 48%. In the meantime, he will continue aspirin therapy to prevent risk of blood clot. The risks, benefit, side effects of phlebotomy is discussed with him and his wife and he agreed to proceed.  Essential hypertension he will continue current medical management. I recommend close follow-up with primary care doctor for medication adjustment.   History of prostate cancer According to the patient, he did well after surgery. He is being monitored by urologist with PSA surveillance monitoring on a routine basis.     All questions were answered. The patient knows to call the clinic with any problems, questions or concerns. No barriers to learning was detected.  I spent 15 minutes counseling the patient face to face. The total time spent in the appointment was 20 minutes and more than 50%  was on counseling.     [facility], Cory Wamble, MD 9/11/201712:30 PM

## 2016-03-28 DIAGNOSIS — N393 Stress incontinence (female) (male): Secondary | ICD-10-CM | POA: Diagnosis not present

## 2016-03-28 DIAGNOSIS — N3944 Nocturnal enuresis: Secondary | ICD-10-CM | POA: Diagnosis not present

## 2016-05-06 ENCOUNTER — Ambulatory Visit: Payer: Medicare Other

## 2016-05-06 ENCOUNTER — Other Ambulatory Visit (HOSPITAL_BASED_OUTPATIENT_CLINIC_OR_DEPARTMENT_OTHER): Payer: Medicare Other

## 2016-05-06 DIAGNOSIS — D751 Secondary polycythemia: Secondary | ICD-10-CM | POA: Diagnosis present

## 2016-05-06 LAB — CBC WITH DIFFERENTIAL/PLATELET
BASO%: 0.2 % (ref 0.0–2.0)
Basophils Absolute: 0 10*3/uL (ref 0.0–0.1)
EOS%: 0.7 % (ref 0.0–7.0)
Eosinophils Absolute: 0 10*3/uL (ref 0.0–0.5)
HEMATOCRIT: 45.7 % (ref 38.4–49.9)
HGB: 14.7 g/dL (ref 13.0–17.1)
LYMPH#: 1.6 10*3/uL (ref 0.9–3.3)
LYMPH%: 40 % (ref 14.0–49.0)
MCH: 23.9 pg — ABNORMAL LOW (ref 27.2–33.4)
MCHC: 32.2 g/dL (ref 32.0–36.0)
MCV: 74.3 fL — ABNORMAL LOW (ref 79.3–98.0)
MONO#: 0.4 10*3/uL (ref 0.1–0.9)
MONO%: 10.4 % (ref 0.0–14.0)
NEUT%: 48.7 % (ref 39.0–75.0)
NEUTROS ABS: 2 10*3/uL (ref 1.5–6.5)
PLATELETS: 163 10*3/uL (ref 140–400)
RBC: 6.15 10*6/uL — ABNORMAL HIGH (ref 4.20–5.82)
RDW: 16.7 % — ABNORMAL HIGH (ref 11.0–14.6)
WBC: 4 10*3/uL (ref 4.0–10.3)

## 2016-05-06 NOTE — Progress Notes (Signed)
Patient reports feeling well - given a copy of labs and went home - no phlebotomy needed today.

## 2016-07-03 DIAGNOSIS — I10 Essential (primary) hypertension: Secondary | ICD-10-CM | POA: Diagnosis not present

## 2016-07-03 DIAGNOSIS — E7800 Pure hypercholesterolemia, unspecified: Secondary | ICD-10-CM | POA: Diagnosis not present

## 2016-07-03 DIAGNOSIS — L1230 Acquired epidermolysis bullosa, unspecified: Secondary | ICD-10-CM | POA: Diagnosis not present

## 2016-07-08 ENCOUNTER — Ambulatory Visit (HOSPITAL_BASED_OUTPATIENT_CLINIC_OR_DEPARTMENT_OTHER): Payer: Medicare Other

## 2016-07-08 ENCOUNTER — Other Ambulatory Visit (HOSPITAL_BASED_OUTPATIENT_CLINIC_OR_DEPARTMENT_OTHER): Payer: Medicare Other

## 2016-07-08 VITALS — BP 119/69 | HR 80 | Temp 98.0°F | Resp 18

## 2016-07-08 DIAGNOSIS — D751 Secondary polycythemia: Secondary | ICD-10-CM

## 2016-07-08 LAB — CBC WITH DIFFERENTIAL/PLATELET
BASO%: 0.6 % (ref 0.0–2.0)
Basophils Absolute: 0 10*3/uL (ref 0.0–0.1)
EOS ABS: 0 10*3/uL (ref 0.0–0.5)
EOS%: 0.5 % (ref 0.0–7.0)
HCT: 50.9 % — ABNORMAL HIGH (ref 38.4–49.9)
HEMOGLOBIN: 16.3 g/dL (ref 13.0–17.1)
LYMPH%: 25.4 % (ref 14.0–49.0)
MCH: 24 pg — ABNORMAL LOW (ref 27.2–33.4)
MCHC: 32 g/dL (ref 32.0–36.0)
MCV: 75 fL — AB (ref 79.3–98.0)
MONO#: 0.7 10*3/uL (ref 0.1–0.9)
MONO%: 11.3 % (ref 0.0–14.0)
NEUT#: 3.7 10*3/uL (ref 1.5–6.5)
NEUT%: 62.2 % (ref 39.0–75.0)
Platelets: 165 10*3/uL (ref 140–400)
RBC: 6.79 10*6/uL — AB (ref 4.20–5.82)
RDW: 18.1 % — AB (ref 11.0–14.6)
WBC: 6 10*3/uL (ref 4.0–10.3)
lymph#: 1.5 10*3/uL (ref 0.9–3.3)

## 2016-07-08 NOTE — Progress Notes (Signed)
Stayed for 20 minutes post phlebotomy, stated that is as long as he could stay today. Denies dizziness, tolerated procedure well

## 2016-07-08 NOTE — Patient Instructions (Signed)
Therapeutic Phlebotomy, Care After  Refer to this sheet in the next few weeks. These instructions provide you with information about caring for yourself after your procedure. Your health care provider may also give you more specific instructions. Your treatment has been planned according to current medical practices, but problems sometimes occur. Call your health care provider if you have any problems or questions after your procedure.  WHAT TO EXPECT AFTER THE PROCEDURE  After your procedure, it is common to have:   Light-headedness or dizziness. You may feel faint.   Nausea.   Tiredness.  HOME CARE INSTRUCTIONS  Activities   Return to your normal activities as directed by your health care provider. Most people can go back to their normal activities right away.   Avoid strenuous physical activity and heavy lifting or pulling for about 5 hours after the procedure. Do not lift anything that is heavier than 10 lb (4.5 kg).   Athletes should avoid strenuous exercise for at least 12 hours.   Change positions slowly for the remainder of the day. This will help to prevent light-headedness or fainting.   If you feel light-headed, lie down until the feeling goes away.  Eating and Drinking   Be sure to eat well-balanced meals for the next 24 hours.   Drink enough fluid to keep your urine clear or pale yellow.   Avoid drinking alcohol on the day that you had the procedure.  Care of the Needle Insertion Site   Keep your bandage dry. You can remove the bandage after about 5 hours or as directed by your health care provider.   If you have bleeding from the needle insertion site, elevate your arm and press firmly on the site until the bleeding stops.   If you have bruising at the site, apply ice to the area:   Put ice in a plastic bag.   Place a towel between your skin and the bag.   Leave the ice on for 20 minutes, 2-3 times a day for the first 24 hours.   If the swelling does not go away after 24 hours, apply  a warm, moist washcloth to the area for 20 minutes, 2-3 times a day.  General Instructions   Avoid smoking for at least 30 minutes after the procedure.   Keep all follow-up visits as directed by your health care provider. It is important to continue with further therapeutic phlebotomy treatments as directed.  SEEK MEDICAL CARE IF:   You have redness, swelling, or pain at the needle insertion site.   You have fluid, blood, or pus coming from the needle insertion site.   You feel light-headed, dizzy, or nauseated, and the feeling does not go away.   You notice new bruising at the needle insertion site.   You feel weaker than normal.   You have a fever or chills.  SEEK IMMEDIATE MEDICAL CARE IF:   You have severe nausea or vomiting.   You have chest pain.   You have trouble breathing.    This information is not intended to replace advice given to you by your health care provider. Make sure you discuss any questions you have with your health care provider.    Document Released: 11/19/2010 Document Revised: 11/01/2014 Document Reviewed: 06/13/2014  Elsevier Interactive Patient Education 2016 [employer]

## 2016-09-02 ENCOUNTER — Ambulatory Visit (HOSPITAL_BASED_OUTPATIENT_CLINIC_OR_DEPARTMENT_OTHER): Payer: Medicare Other

## 2016-09-02 ENCOUNTER — Other Ambulatory Visit (HOSPITAL_BASED_OUTPATIENT_CLINIC_OR_DEPARTMENT_OTHER): Payer: Medicare Other

## 2016-09-02 VITALS — BP 141/72 | HR 73 | Temp 97.4°F | Resp 18

## 2016-09-02 DIAGNOSIS — D751 Secondary polycythemia: Secondary | ICD-10-CM

## 2016-09-02 LAB — CBC WITH DIFFERENTIAL/PLATELET
BASO%: 0.3 % (ref 0.0–2.0)
Basophils Absolute: 0 10*3/uL (ref 0.0–0.1)
EOS%: 0.8 % (ref 0.0–7.0)
Eosinophils Absolute: 0 10*3/uL (ref 0.0–0.5)
HCT: 49.1 % (ref 38.4–49.9)
HEMOGLOBIN: 16.2 g/dL (ref 13.0–17.1)
LYMPH%: 38.1 % (ref 14.0–49.0)
MCH: 24.7 pg — AB (ref 27.2–33.4)
MCHC: 33 g/dL (ref 32.0–36.0)
MCV: 74.8 fL — ABNORMAL LOW (ref 79.3–98.0)
MONO#: 0.3 10*3/uL (ref 0.1–0.9)
MONO%: 8 % (ref 0.0–14.0)
NEUT#: 2 10*3/uL (ref 1.5–6.5)
NEUT%: 52.8 % (ref 39.0–75.0)
Platelets: 152 10*3/uL (ref 140–400)
RBC: 6.56 10*6/uL — ABNORMAL HIGH (ref 4.20–5.82)
RDW: 16.9 % — ABNORMAL HIGH (ref 11.0–14.6)
WBC: 3.9 10*3/uL — AB (ref 4.0–10.3)
lymph#: 1.5 10*3/uL (ref 0.9–3.3)
nRBC: 0 % (ref 0–0)

## 2016-09-02 NOTE — Progress Notes (Signed)
Therapeutic phlebotomy performed per order using phlebotomy kit from [facility].  550 grams removed over approximately 6 minutes.  Pt tolerated procedure well.  Will continue to monitor.  Juice provided, pt refused offer of snacks.  1100: Pt refuses to stay full 30 minute post-observation.  Stayed approx. 20 minutes.  Vital signs stable, denies any symptoms of dizziness or light-headedness.  Ambulatory in no distress at time of discharge.

## 2016-09-02 NOTE — Patient Instructions (Signed)
     Therapeutic Phlebotomy, Care After Refer to this sheet in the next few weeks. These instructions provide you with information about caring for yourself after your procedure. Your health care provider may also give you more specific instructions. Your treatment has been planned according to current medical practices, but problems sometimes occur. Call your health care provider if you have any problems or questions after your procedure. What can I expect after the procedure? After the procedure, it is common to have:  Light-headedness or dizziness. You may feel faint.  Nausea.  Tiredness. Follow these instructions at home: Activity  Return to your normal activities as directed by your health care provider. Most people can go back to their normal activities right away.  Avoid strenuous physical activity and heavy lifting or pulling for about 5 hours after the procedure. Do not lift anything that is heavier than 10 lb (4.5 kg).  Athletes should avoid strenuous exercise for at least 12 hours.  Change positions slowly for the remainder of the day. This will help to prevent light-headedness or fainting.  If you feel light-headed, lie down until the feeling goes away. Eating and drinking  Be sure to eat well-balanced meals for the next 24 hours.  Drink enough fluid to keep your urine clear or pale yellow.  Avoid drinking alcohol on the day that you had the procedure. Care of the Needle Insertion Site  Keep your bandage dry. You can remove the bandage after about 5 hours or as directed by your health care provider.  If you have bleeding from the needle insertion site, elevate your arm and press firmly on the site until the bleeding stops.  If you have bruising at the site, apply ice to the area:  Put ice in a plastic bag.  Place a towel between your skin and the bag.  Leave the ice on for 20 minutes, 2-3 times a day for the first 24 hours.  If the swelling does not go away  after 24 hours, apply a warm, moist washcloth to the area for 20 minutes, 2-3 times a day. General instructions  Avoid smoking for at least 30 minutes after the procedure.  Keep all follow-up visits as directed by your health care provider. It is important to continue with further therapeutic phlebotomy treatments as directed. Contact a health care provider if:  You have redness, swelling, or pain at the needle insertion site.  You have fluid, blood, or pus coming from the needle insertion site.  You feel light-headed, dizzy, or nauseated, and the feeling does not go away.  You notice new bruising at the needle insertion site.  You feel weaker than normal.  You have a fever or chills. Get help right away if:  You have severe nausea or vomiting.  You have chest pain.  You have trouble breathing. This information is not intended to replace advice given to you by your health care provider. Make sure you discuss any questions you have with your health care provider. Document Released: 11/19/2010 Document Revised: 02/17/2016 Document Reviewed: 06/13/2014 Elsevier Interactive Patient Education  2017 [employer] Inc.  

## 2016-11-04 ENCOUNTER — Other Ambulatory Visit: Payer: Medicare Other

## 2016-11-07 ENCOUNTER — Ambulatory Visit (HOSPITAL_BASED_OUTPATIENT_CLINIC_OR_DEPARTMENT_OTHER): Payer: Medicare Other

## 2016-11-07 ENCOUNTER — Other Ambulatory Visit (HOSPITAL_BASED_OUTPATIENT_CLINIC_OR_DEPARTMENT_OTHER): Payer: Medicare Other

## 2016-11-07 VITALS — BP 118/69 | HR 88 | Temp 98.3°F | Resp 18

## 2016-11-07 DIAGNOSIS — D751 Secondary polycythemia: Secondary | ICD-10-CM | POA: Diagnosis present

## 2016-11-07 LAB — CBC WITH DIFFERENTIAL/PLATELET
BASO%: 0.3 % (ref 0.0–2.0)
BASOS ABS: 0 10*3/uL (ref 0.0–0.1)
EOS%: 1.1 % (ref 0.0–7.0)
Eosinophils Absolute: 0 10*3/uL (ref 0.0–0.5)
HCT: 49.3 % (ref 38.4–49.9)
HEMOGLOBIN: 15.9 g/dL (ref 13.0–17.1)
LYMPH%: 40.6 % (ref 14.0–49.0)
MCH: 24.6 pg — ABNORMAL LOW (ref 27.2–33.4)
MCHC: 32.3 g/dL (ref 32.0–36.0)
MCV: 76.2 fL — AB (ref 79.3–98.0)
MONO#: 0.4 10*3/uL (ref 0.1–0.9)
MONO%: 11 % (ref 0.0–14.0)
NEUT#: 1.8 10*3/uL (ref 1.5–6.5)
NEUT%: 47 % (ref 39.0–75.0)
Platelets: 156 10*3/uL (ref 140–400)
RBC: 6.47 10*6/uL — AB (ref 4.20–5.82)
RDW: 16 % — AB (ref 11.0–14.6)
WBC: 3.7 10*3/uL — ABNORMAL LOW (ref 4.0–10.3)
lymph#: 1.5 10*3/uL (ref 0.9–3.3)

## 2016-11-07 NOTE — Progress Notes (Signed)
Therapeutic Phlebotomy performed per MD order in rt AC using 20 gauge PIV (one unsuccessful attempt using 16 gauge phlebotomy kit in left [facility]) starting at 0957 and ending at 1019 removing 536 grams. Pt tolerated well. Drink provided, pt refused snack. Pt monitored approx 26 minutes after procedure (pt refused to stay full 30 minutes for post observation). Pt and VS stable at discharge.

## 2016-12-26 DIAGNOSIS — E7800 Pure hypercholesterolemia, unspecified: Secondary | ICD-10-CM | POA: Diagnosis not present

## 2016-12-26 DIAGNOSIS — G459 Transient cerebral ischemic attack, unspecified: Secondary | ICD-10-CM | POA: Diagnosis not present

## 2016-12-26 DIAGNOSIS — I10 Essential (primary) hypertension: Secondary | ICD-10-CM | POA: Diagnosis not present

## 2016-12-26 DIAGNOSIS — Z1389 Encounter for screening for other disorder: Secondary | ICD-10-CM | POA: Diagnosis not present

## 2016-12-26 DIAGNOSIS — N5201 Erectile dysfunction due to arterial insufficiency: Secondary | ICD-10-CM | POA: Diagnosis not present

## 2016-12-26 DIAGNOSIS — M1990 Unspecified osteoarthritis, unspecified site: Secondary | ICD-10-CM | POA: Diagnosis not present

## 2016-12-26 DIAGNOSIS — C61 Malignant neoplasm of prostate: Secondary | ICD-10-CM | POA: Diagnosis not present

## 2016-12-26 DIAGNOSIS — Z0000 Encounter for general adult medical examination without abnormal findings: Secondary | ICD-10-CM | POA: Diagnosis not present

## 2016-12-26 DIAGNOSIS — D751 Secondary polycythemia: Secondary | ICD-10-CM | POA: Diagnosis not present

## 2017-01-06 ENCOUNTER — Other Ambulatory Visit (HOSPITAL_BASED_OUTPATIENT_CLINIC_OR_DEPARTMENT_OTHER): Payer: Medicare Other

## 2017-01-06 ENCOUNTER — Ambulatory Visit (HOSPITAL_BASED_OUTPATIENT_CLINIC_OR_DEPARTMENT_OTHER): Payer: Medicare Other

## 2017-01-06 DIAGNOSIS — D751 Secondary polycythemia: Secondary | ICD-10-CM

## 2017-01-06 LAB — CBC WITH DIFFERENTIAL/PLATELET
BASO%: 0.3 % (ref 0.0–2.0)
Basophils Absolute: 0 10*3/uL (ref 0.0–0.1)
EOS%: 1.2 % (ref 0.0–7.0)
Eosinophils Absolute: 0.1 10*3/uL (ref 0.0–0.5)
HCT: 49.2 % (ref 38.4–49.9)
HEMOGLOBIN: 15.8 g/dL (ref 13.0–17.1)
LYMPH%: 39.7 % (ref 14.0–49.0)
MCH: 23.9 pg — ABNORMAL LOW (ref 27.2–33.4)
MCHC: 32 g/dL (ref 32.0–36.0)
MCV: 74.6 fL — AB (ref 79.3–98.0)
MONO#: 0.5 10*3/uL (ref 0.1–0.9)
MONO%: 11.5 % (ref 0.0–14.0)
NEUT%: 47.3 % (ref 39.0–75.0)
NEUTROS ABS: 2 10*3/uL (ref 1.5–6.5)
Platelets: 162 10*3/uL (ref 140–400)
RBC: 6.59 10*6/uL — ABNORMAL HIGH (ref 4.20–5.82)
RDW: 17.1 % — AB (ref 11.0–14.6)
WBC: 4.2 10*3/uL (ref 4.0–10.3)
lymph#: 1.7 10*3/uL (ref 0.9–3.3)

## 2017-01-06 NOTE — Patient Instructions (Signed)
     Therapeutic Phlebotomy, Care After Refer to this sheet in the next few weeks. These instructions provide you with information about caring for yourself after your procedure. Your health care provider may also give you more specific instructions. Your treatment has been planned according to current medical practices, but problems sometimes occur. Call your health care provider if you have any problems or questions after your procedure. What can I expect after the procedure? After the procedure, it is common to have:  Light-headedness or dizziness. You may feel faint.  Nausea.  Tiredness. Follow these instructions at home: Activity  Return to your normal activities as directed by your health care provider. Most people can go back to their normal activities right away.  Avoid strenuous physical activity and heavy lifting or pulling for about 5 hours after the procedure. Do not lift anything that is heavier than 10 lb (4.5 kg).  Athletes should avoid strenuous exercise for at least 12 hours.  Change positions slowly for the remainder of the day. This will help to prevent light-headedness or fainting.  If you feel light-headed, lie down until the feeling goes away. Eating and drinking  Be sure to eat well-balanced meals for the next 24 hours.  Drink enough fluid to keep your urine clear or pale yellow.  Avoid drinking alcohol on the day that you had the procedure. Care of the Needle Insertion Site  Keep your bandage dry. You can remove the bandage after about 5 hours or as directed by your health care provider.  If you have bleeding from the needle insertion site, elevate your arm and press firmly on the site until the bleeding stops.  If you have bruising at the site, apply ice to the area:  Put ice in a plastic bag.  Place a towel between your skin and the bag.  Leave the ice on for 20 minutes, 2-3 times a day for the first 24 hours.  If the swelling does not go away  after 24 hours, apply a warm, moist washcloth to the area for 20 minutes, 2-3 times a day. General instructions  Avoid smoking for at least 30 minutes after the procedure.  Keep all follow-up visits as directed by your health care provider. It is important to continue with further therapeutic phlebotomy treatments as directed. Contact a health care provider if:  You have redness, swelling, or pain at the needle insertion site.  You have fluid, blood, or pus coming from the needle insertion site.  You feel light-headed, dizzy, or nauseated, and the feeling does not go away.  You notice new bruising at the needle insertion site.  You feel weaker than normal.  You have a fever or chills. Get help right away if:  You have severe nausea or vomiting.  You have chest pain.  You have trouble breathing. This information is not intended to replace advice given to you by your health care provider. Make sure you discuss any questions you have with your health care provider. Document Released: 11/19/2010 Document Revised: 02/17/2016 Document Reviewed: 06/13/2014 Elsevier Interactive Patient Education  2017 [employer] Inc.  

## 2017-01-06 NOTE — Progress Notes (Signed)
At 1005, IV was started per policy in left [facility] using phlebotomy kit.  Good blood flow initially but grew sluggish.  At 1030, 362 grams of blood had been collected and the IV was clotted.  Pt refused to be stuck again today stating "you guys owe me anyways since you usually take more than 500 each time".  Val RN for Dr. Alvy Bimler notified of patient refusal to obtain the remainder of the unit of blood.  Pt drank juice and refused a snack.  After 20 minutes, pt left infusion area refusing to stay any longer.  No complications or reports of dizziness noted.  VS stable at discharge.

## 2017-03-03 DIAGNOSIS — T162XXA Foreign body in left ear, initial encounter: Secondary | ICD-10-CM | POA: Diagnosis not present

## 2017-03-10 ENCOUNTER — Other Ambulatory Visit: Payer: Medicare Other

## 2017-03-17 ENCOUNTER — Other Ambulatory Visit (HOSPITAL_BASED_OUTPATIENT_CLINIC_OR_DEPARTMENT_OTHER): Payer: Medicare Other

## 2017-03-17 ENCOUNTER — Ambulatory Visit (HOSPITAL_BASED_OUTPATIENT_CLINIC_OR_DEPARTMENT_OTHER): Payer: Medicare Other | Admitting: Hematology and Oncology

## 2017-03-17 ENCOUNTER — Ambulatory Visit (HOSPITAL_BASED_OUTPATIENT_CLINIC_OR_DEPARTMENT_OTHER): Payer: Medicare Other

## 2017-03-17 ENCOUNTER — Encounter: Payer: Self-pay | Admitting: Hematology and Oncology

## 2017-03-17 ENCOUNTER — Telehealth: Payer: Self-pay | Admitting: Hematology and Oncology

## 2017-03-17 VITALS — BP 130/75 | HR 73 | Resp 17

## 2017-03-17 DIAGNOSIS — D751 Secondary polycythemia: Secondary | ICD-10-CM

## 2017-03-17 DIAGNOSIS — I10 Essential (primary) hypertension: Secondary | ICD-10-CM | POA: Diagnosis not present

## 2017-03-17 LAB — CBC WITH DIFFERENTIAL/PLATELET
BASO%: 0.2 % (ref 0.0–2.0)
BASOS ABS: 0 10*3/uL (ref 0.0–0.1)
EOS ABS: 0 10*3/uL (ref 0.0–0.5)
EOS%: 1 % (ref 0.0–7.0)
HCT: 49.6 % (ref 38.4–49.9)
HEMOGLOBIN: 16.2 g/dL (ref 13.0–17.1)
LYMPH%: 47.2 % (ref 14.0–49.0)
MCH: 24.8 pg — ABNORMAL LOW (ref 27.2–33.4)
MCHC: 32.7 g/dL (ref 32.0–36.0)
MCV: 75.8 fL — AB (ref 79.3–98.0)
MONO#: 0.4 10*3/uL (ref 0.1–0.9)
MONO%: 9.7 % (ref 0.0–14.0)
NEUT#: 1.7 10*3/uL (ref 1.5–6.5)
NEUT%: 41.9 % (ref 39.0–75.0)
Platelets: 146 10*3/uL (ref 140–400)
RBC: 6.54 10*6/uL — AB (ref 4.20–5.82)
RDW: 17.3 % — AB (ref 11.0–14.6)
WBC: 4.1 10*3/uL (ref 4.0–10.3)
lymph#: 2 10*3/uL (ref 0.9–3.3)

## 2017-03-17 NOTE — Patient Instructions (Signed)

## 2017-03-17 NOTE — Assessment & Plan Note (Signed)
he will continue current medical management. I recommend close follow-up with primary care doctor for medication adjustment.  

## 2017-03-17 NOTE — Telephone Encounter (Signed)
Scheduled appt per 9/17 los - Gave patient AVS and calender per los.  

## 2017-03-17 NOTE — Progress Notes (Signed)
[facility] OFFICE PROGRESS NOTE  Patient Care Team: [name], MD as PCP - General (Family Medicine) Heath Lark, MD as Consulting Physician (Hematology and Oncology)  SUMMARY OF ONCOLOGIC HISTORY:  This is a pleasant gentleman who was found to have abnormal CBC with polycythemia In 2014 he had a bone marrow aspirate and biopsy that came back nondiagnostic for myeloproliferative disorder. JAK 2 mutation testing was negative. The patient had intermittent phlebotomy sessions and remain on aspirin  INTERVAL HISTORY: Please see below for problem oriented charting. He feels well He is being monitored closely for diagnosis of prostate cancer Recent PSA apparently was undetectable He denies dizziness or symptoms with each phlebotomy session  REVIEW OF SYSTEMS:   Constitutional: Denies fevers, chills or abnormal weight loss Eyes: Denies blurriness of vision Ears, nose, mouth, throat, and face: Denies mucositis or sore throat Respiratory: Denies cough, dyspnea or wheezes Cardiovascular: Denies palpitation, chest discomfort or lower extremity swelling Gastrointestinal:  Denies nausea, heartburn or change in bowel habits Skin: Denies abnormal skin rashes Lymphatics: Denies new lymphadenopathy or easy bruising Neurological:Denies numbness, tingling or new weaknesses Behavioral/Psych: Mood is stable, no new changes  All other systems were reviewed with the patient and are negative.  I have reviewed the past medical history, past surgical history, social history and family history with the patient and they are unchanged from previous note.  ALLERGIES:  has No Known Allergies.  MEDICATIONS:  Current Outpatient Prescriptions  Medication Sig Dispense Refill  . aspirin EC 81 MG tablet Take 81 mg by mouth daily.    Marland Kitchen atorvastatin (LIPITOR) 40 MG tablet Take 40 mg by mouth daily.    . benazepril-hydrochlorthiazide (LOTENSIN HCT) 20-12.5 MG per tablet Take 1 tablet by mouth  every morning.    . ezetimibe (ZETIA) 10 MG tablet Take 10 mg by mouth every morning.     No current facility-administered medications for this visit.     PHYSICAL EXAMINATION: ECOG PERFORMANCE STATUS: 0 - Asymptomatic  Vitals:   03/17/17 0923  BP: (!) 147/76  Pulse: 68  Resp: 18  Temp: 98.5 F (36.9 C)  SpO2: 98%   Filed Weights   03/17/17 0923  Weight: 193 lb 6.4 oz (87.7 kg)    GENERAL:alert, no distress and comfortable SKIN: skin color, texture, turgor are normal, no rashes or significant lesions EYES: normal, Conjunctiva are pink and non-injected, sclera clear OROPHARYNX:no exudate, no erythema and lips, buccal mucosa, and tongue normal  NECK: supple, thyroid normal size, non-tender, without nodularity LYMPH:  no palpable lymphadenopathy in the cervical, axillary or inguinal LUNGS: clear to auscultation and percussion with normal breathing effort HEART: regular rate & rhythm and no murmurs and no lower extremity edema ABDOMEN:abdomen soft, non-tender and normal bowel sounds Musculoskeletal:no cyanosis of digits and no clubbing  NEURO: alert & oriented x 3 with fluent speech, no focal motor/sensory deficits  LABORATORY DATA:  I have reviewed the data as listed    Component Value Date/Time   NA 138 03/05/2014 0810   NA 144 03/03/2013 0944   K 3.5 (L) 03/05/2014 0810   K 3.6 03/03/2013 0944   CL 98 03/05/2014 0810   CL 106 08/04/2012 0957   CO2 27 03/05/2014 0810   CO2 26 03/03/2013 0944   GLUCOSE 115 (H) 03/05/2014 0810   GLUCOSE 132 03/03/2013 0944   GLUCOSE 108 (H) 08/04/2012 0957   BUN 20 03/05/2014 0810   BUN 19.6 03/03/2013 0944   CREATININE 1.08 03/05/2014 0810   CREATININE  1.2 03/03/2013 0944   CALCIUM 9.1 03/05/2014 0810   CALCIUM 9.3 03/03/2013 0944   PROT 6.9 03/03/2013 0944   ALBUMIN 3.9 03/03/2013 0944   AST 20 03/03/2013 0944   ALT 27 03/03/2013 0944   ALKPHOS 69 03/03/2013 0944   BILITOT 1.29 (H) 03/03/2013 0944   GFRNONAA 65 (L)  03/05/2014 0810   GFRAA 76 (L) 03/05/2014 0810    No results found for: SPEP, UPEP  Lab Results  Component Value Date   WBC 4.1 03/17/2017   NEUTROABS 1.7 03/17/2017   HGB 16.2 03/17/2017   HCT 49.6 03/17/2017   MCV 75.8 (L) 03/17/2017   PLT 146 03/17/2017      Chemistry      Component Value Date/Time   NA 138 03/05/2014 0810   NA 144 03/03/2013 0944   K 3.5 (L) 03/05/2014 0810   K 3.6 03/03/2013 0944   CL 98 03/05/2014 0810   CL 106 08/04/2012 0957   CO2 27 03/05/2014 0810   CO2 26 03/03/2013 0944   BUN 20 03/05/2014 0810   BUN 19.6 03/03/2013 0944   CREATININE 1.08 03/05/2014 0810   CREATININE 1.2 03/03/2013 0944      Component Value Date/Time   CALCIUM 9.1 03/05/2014 0810   CALCIUM 9.3 03/03/2013 0944   ALKPHOS 69 03/03/2013 0944   AST 20 03/03/2013 0944   ALT 27 03/03/2013 0944   BILITOT 1.29 (H) 03/03/2013 0944      ASSESSMENT & PLAN:  Polycythemia The patient has been tested extensively. Overall picture is not consistent with myeloproliferative disorder. JAK2 mutation and serum erythropoietin were within normal limits. I suspect there may be a component of undiagnosed obstructive sleep apnea. I recommend phlebotomy one unit of blood every 8 weeks to keep hematocrit under 48%. In the meantime, he will continue aspirin therapy to prevent risk of blood clot. The risks, benefit, side effects of phlebotomy is discussed with him and he agreed to proceed. The microcytosis seen is likely due to relative iron deficiency from extensive phlebotomy I do not recommend iron replacement therapy  Essential hypertension he will continue current medical management. I recommend close follow-up with primary care doctor for medication adjustment.    No orders of the defined types were placed in this encounter.  All questions were answered. The patient knows to call the clinic with any problems, questions or concerns. No barriers to learning was detected. I spent 10  minutes counseling the patient face to face. The total time spent in the appointment was 15 minutes and more than 50% was on counseling and review of test results     Heath Lark, MD 03/17/2017 11:25 AM

## 2017-03-17 NOTE — Progress Notes (Signed)
Therapeutic phlebotomy indicated today with HCT of 49.6. 515 grams obtained from 0945 - 5041. Drink accepted, snack declined. Patient refused to stay entire 30 minute post-observation. Vital signs stable at time of discharge. No complaints via patient, stable at time of discharge.   Wylene Simmer, BSN, RN

## 2017-03-17 NOTE — Assessment & Plan Note (Signed)
The patient has been tested extensively. Overall picture is not consistent with myeloproliferative disorder. JAK2 mutation and serum erythropoietin were within normal limits. I suspect there may be a component of undiagnosed obstructive sleep apnea. I recommend phlebotomy one unit of blood every 8 weeks to keep hematocrit under 48%. In the meantime, he will continue aspirin therapy to prevent risk of blood clot. The risks, benefit, side effects of phlebotomy is discussed with him and he agreed to proceed. The microcytosis seen is likely due to relative iron deficiency from extensive phlebotomy I do not recommend iron replacement therapy

## 2017-04-02 DIAGNOSIS — N3941 Urge incontinence: Secondary | ICD-10-CM | POA: Diagnosis not present

## 2017-04-02 DIAGNOSIS — R351 Nocturia: Secondary | ICD-10-CM | POA: Diagnosis not present

## 2017-04-02 DIAGNOSIS — Z8546 Personal history of malignant neoplasm of prostate: Secondary | ICD-10-CM | POA: Diagnosis not present

## 2017-04-28 DIAGNOSIS — H524 Presbyopia: Secondary | ICD-10-CM | POA: Diagnosis not present

## 2017-04-28 DIAGNOSIS — H40003 Preglaucoma, unspecified, bilateral: Secondary | ICD-10-CM | POA: Diagnosis not present

## 2017-04-28 DIAGNOSIS — H11003 Unspecified pterygium of eye, bilateral: Secondary | ICD-10-CM | POA: Diagnosis not present

## 2017-04-28 DIAGNOSIS — H25813 Combined forms of age-related cataract, bilateral: Secondary | ICD-10-CM | POA: Diagnosis not present

## 2017-04-28 DIAGNOSIS — H04123 Dry eye syndrome of bilateral lacrimal glands: Secondary | ICD-10-CM | POA: Diagnosis not present

## 2017-05-12 ENCOUNTER — Ambulatory Visit: Payer: Medicare Other

## 2017-05-12 ENCOUNTER — Other Ambulatory Visit (HOSPITAL_BASED_OUTPATIENT_CLINIC_OR_DEPARTMENT_OTHER): Payer: Medicare Other

## 2017-05-12 DIAGNOSIS — D751 Secondary polycythemia: Secondary | ICD-10-CM | POA: Diagnosis present

## 2017-05-12 LAB — CBC WITH DIFFERENTIAL/PLATELET
BASO%: 0.2 % (ref 0.0–2.0)
BASOS ABS: 0 10*3/uL (ref 0.0–0.1)
EOS%: 0.9 % (ref 0.0–7.0)
Eosinophils Absolute: 0 10*3/uL (ref 0.0–0.5)
HCT: 48.8 % (ref 38.4–49.9)
HGB: 15.5 g/dL (ref 13.0–17.1)
LYMPH%: 37.6 % (ref 14.0–49.0)
MCH: 24.3 pg — AB (ref 27.2–33.4)
MCHC: 31.8 g/dL — AB (ref 32.0–36.0)
MCV: 76.4 fL — ABNORMAL LOW (ref 79.3–98.0)
MONO#: 0.5 10*3/uL (ref 0.1–0.9)
MONO%: 12.4 % (ref 0.0–14.0)
NEUT#: 2.1 10*3/uL (ref 1.5–6.5)
NEUT%: 48.9 % (ref 39.0–75.0)
Platelets: 152 10*3/uL (ref 140–400)
RBC: 6.39 10*6/uL — ABNORMAL HIGH (ref 4.20–5.82)
RDW: 16.6 % — ABNORMAL HIGH (ref 11.0–14.6)
WBC: 4.3 10*3/uL (ref 4.0–10.3)
lymph#: 1.6 10*3/uL (ref 0.9–3.3)

## 2017-05-12 NOTE — Patient Instructions (Signed)

## 2017-07-07 ENCOUNTER — Inpatient Hospital Stay: Payer: Medicare Other

## 2017-07-07 ENCOUNTER — Inpatient Hospital Stay: Payer: Medicare Other | Attending: Hematology and Oncology

## 2017-07-07 DIAGNOSIS — D751 Secondary polycythemia: Secondary | ICD-10-CM | POA: Diagnosis not present

## 2017-07-07 LAB — CBC WITH DIFFERENTIAL/PLATELET
Abs Granulocyte: 2.5 10*3/uL (ref 1.5–6.5)
BASOS PCT: 1 %
Basophils Absolute: 0 10*3/uL (ref 0.0–0.1)
Eosinophils Absolute: 0 10*3/uL (ref 0.0–0.5)
Eosinophils Relative: 1 %
HEMATOCRIT: 46.2 % (ref 38.4–49.9)
HEMOGLOBIN: 15 g/dL (ref 13.0–17.1)
Lymphocytes Relative: 26 %
Lymphs Abs: 1.1 10*3/uL (ref 0.9–3.3)
MCH: 24.3 pg — ABNORMAL LOW (ref 27.2–33.4)
MCHC: 32.4 g/dL (ref 32.0–36.0)
MCV: 75.1 fL — ABNORMAL LOW (ref 79.3–98.0)
Monocytes Absolute: 0.4 10*3/uL (ref 0.1–0.9)
Monocytes Relative: 11 %
NEUTROS ABS: 2.5 10*3/uL (ref 1.5–6.5)
NEUTROS PCT: 61 %
Platelets: 173 10*3/uL (ref 140–400)
RBC: 6.15 MIL/uL — AB (ref 4.20–5.82)
RDW: 16.9 % — ABNORMAL HIGH (ref 11.0–15.6)
WBC: 4.1 10*3/uL (ref 4.0–10.3)

## 2017-07-07 NOTE — Progress Notes (Signed)
Pt feels well-his HCT = 46.2. Vss. Pt left -no phlebotomy

## 2017-07-08 DIAGNOSIS — K219 Gastro-esophageal reflux disease without esophagitis: Secondary | ICD-10-CM | POA: Diagnosis not present

## 2017-07-08 DIAGNOSIS — R740 Nonspecific elevation of levels of transaminase and lactic acid dehydrogenase [LDH]: Secondary | ICD-10-CM | POA: Diagnosis not present

## 2017-07-08 DIAGNOSIS — M549 Dorsalgia, unspecified: Secondary | ICD-10-CM | POA: Diagnosis not present

## 2017-07-08 DIAGNOSIS — I10 Essential (primary) hypertension: Secondary | ICD-10-CM | POA: Diagnosis not present

## 2017-07-08 DIAGNOSIS — E7800 Pure hypercholesterolemia, unspecified: Secondary | ICD-10-CM | POA: Diagnosis not present

## 2017-07-10 ENCOUNTER — Other Ambulatory Visit: Payer: Self-pay | Admitting: Family Medicine

## 2017-07-10 DIAGNOSIS — R7989 Other specified abnormal findings of blood chemistry: Secondary | ICD-10-CM

## 2017-07-10 DIAGNOSIS — R945 Abnormal results of liver function studies: Principal | ICD-10-CM

## 2017-07-14 ENCOUNTER — Ambulatory Visit
Admission: RE | Admit: 2017-07-14 | Discharge: 2017-07-14 | Disposition: A | Discharge status: Home / Self Care | Payer: Medicare Other | Source: Ambulatory Visit | Attending: Family Medicine | Admitting: Family Medicine

## 2017-07-14 ENCOUNTER — Other Ambulatory Visit: Payer: Medicare Other

## 2017-07-14 DIAGNOSIS — R945 Abnormal results of liver function studies: Principal | ICD-10-CM

## 2017-07-14 DIAGNOSIS — R7989 Other specified abnormal findings of blood chemistry: Secondary | ICD-10-CM

## 2017-07-23 ENCOUNTER — Ambulatory Visit: Payer: Self-pay | Admitting: Surgery

## 2017-07-23 DIAGNOSIS — K8010 Calculus of gallbladder with chronic cholecystitis without obstruction: Secondary | ICD-10-CM | POA: Diagnosis not present

## 2017-07-23 NOTE — H&P (Signed)
History of Present Illness Cory [name]. Cory Guzy MD; 07/23/2017 4:51 PM) The patient is a 82 year old male who presents for evaluation of gall stones. Referred by Dr. Laurann [name] for gallstones  This is a 82 year old male in good health who presents with about 1 year history of intermittent postprandial epigastric abdominal pain. This does radiate through to his back. Over the last month or so he has had pain in his right flank. He underwent lab tests at Dr. Delene [name] office. This showed abnormal liver functions with a bilirubin of 2.0 AST and ALT in the 800-900s and elevated alkaline phosphatase. She obtained an ultrasound that showed gallstones but no sign of wall thickening. No Murphy sign. No common bile duct dilatation. The patient is now referred for surgical evaluation. He does report frequent postprandial epigastric discomfort. Sometimes he induces vomiting to help relieve his discomfort. He denies any spontaneous vomiting or diarrhea.  CLINICAL DATA: Elevated LFTs  EXAM: ULTRASOUND ABDOMEN LIMITED RIGHT UPPER QUADRANT  COMPARISON: None.  FINDINGS: Gallbladder:  Probable layering 5 mm gallstone. No wall thickening or sonographic Murphy's.  Common bile duct:  Diameter: Normal caliber, 4 mm  Liver:  Increased, heterogeneous echotexture throughout the liver. No focal hepatic abnormality. Portal vein is patent on color Doppler imaging with normal direction of blood flow towards the liver.  IMPRESSION: Heterogeneous, increased echotexture throughout the liver suggesting early fatty infiltration.  Suspect small 5 mm layering gallstone. No evidence of acute cholecystitis.   Electronically Signed By: Cory [name] M.D. On: 07/14/2017 12:38   Allergies (Cory [name], [place]; 07/23/2017 2:40 PM) No Known Drug Allergies [07/23/2017]:  Medication History (Cory [name], CMA; 07/23/2017 2:42 PM) Oxybutynin Chloride ER (10MG  Tablet ER 24HR, Oral) Active. Aspirin (81MG   Tablet, Oral) Active. Lotensin (Oral) Specific strength unknown - Active. Medications Reconciled    Vitals (Cory [name] CMA; 07/23/2017 2:43 PM) 07/23/2017 2:42 PM Weight: 194.5 lb Height: 69in Body Surface Area: 2.04 m Body Mass Index: 28.72 kg/m  Temp.: 98.57F(Oral)  Pulse: 85 (Regular)  BP: 170/90 (Sitting, Left Arm, Standard)      Physical Exam Cory Key K. Navy Rothschild MD; 07/23/2017 4:51 PM)  The physical exam findings are as follows: Note:WDWN in NAD Eyes: Pupils equal, round; sclera anicteric HENT: Oral mucosa moist; good dentition Neck: No masses palpated, no thyromegaly Lungs: CTA bilaterally; normal respiratory effort CV: Regular rate and rhythm; no murmurs; extremities well-perfused with no edema Abd: +bowel sounds, soft, mild epigastric tenderness, no palpable organomegaly; no palpable hernias Skin: Warm, dry; no sign of jaundice Psychiatric - alert and oriented x 4; calm mood and affect    Assessment & Plan Cory Key K. Meeka Cartelli MD; 07/23/2017 3:09 PM)  CHRONIC CHOLECYSTITIS WITH CALCULUS (K80.10)  Current Plans Schedule for Surgery - Laparoscopic cholecystectomy with intraoperative cholangiogram. The surgical procedure has been discussed with the patient. Potential risks, benefits, alternative treatments, and expected outcomes have been explained. All of the patient's questions at this time have been answered. The likelihood of reaching the patient's treatment goal is good. The patient understand the proposed surgical procedure and wishes to proceed.  Cory [name]. Cory Dover, MD, [facility]  General/ Trauma Surgery  07/23/2017 4:51 PM

## 2017-07-23 NOTE — H&P (View-Only) (Signed)
History of Present Illness Cory [name]. Elonzo Sopp MD; 07/23/2017 4:51 PM) The patient is a 82 year old male who presents for evaluation of gall stones. Referred by Dr. Laurann Montana for gallstones  This is a 82 year old male in good health who presents with about 1 year history of intermittent postprandial epigastric abdominal pain. This does radiate through to his back. Over the last month or so he has had pain in his right flank. He underwent lab tests at Dr. Delene Ruffini office. This showed abnormal liver functions with a bilirubin of 2.0 AST and ALT in the 800-900s and elevated alkaline phosphatase. She obtained an ultrasound that showed gallstones but no sign of wall thickening. No Murphy sign. No common bile duct dilatation. The patient is now referred for surgical evaluation. He does report frequent postprandial epigastric discomfort. Sometimes he induces vomiting to help relieve his discomfort. He denies any spontaneous vomiting or diarrhea.  CLINICAL DATA: Elevated LFTs  EXAM: ULTRASOUND ABDOMEN LIMITED RIGHT UPPER QUADRANT  COMPARISON: None.  FINDINGS: Gallbladder:  Probable layering 5 mm gallstone. No wall thickening or sonographic Murphy's.  Common bile duct:  Diameter: Normal caliber, 4 mm  Liver:  Increased, heterogeneous echotexture throughout the liver. No focal hepatic abnormality. Portal vein is patent on color Doppler imaging with normal direction of blood flow towards the liver.  IMPRESSION: Heterogeneous, increased echotexture throughout the liver suggesting early fatty infiltration.  Suspect small 5 mm layering gallstone. No evidence of acute cholecystitis.   Electronically Signed By: Rolm Baptise M.D. On: 07/14/2017 12:38   Allergies (April Staton, [place]; 07/23/2017 2:40 PM) No Known Drug Allergies [07/23/2017]:  Medication History (April Staton, CMA; 07/23/2017 2:42 PM) Oxybutynin Chloride ER (10MG  Tablet ER 24HR, Oral) Active. Aspirin (81MG   Tablet, Oral) Active. Lotensin (Oral) Specific strength unknown - Active. Medications Reconciled    Vitals (April Staton CMA; 07/23/2017 2:43 PM) 07/23/2017 2:42 PM Weight: 194.5 lb Height: 69in Body Surface Area: 2.04 m Body Mass Index: 28.72 kg/m  Temp.: 98.36F(Oral)  Pulse: 85 (Regular)  BP: 170/90 (Sitting, Left Arm, Standard)      Physical Exam Rodman Key K. Edrick Whitehorn MD; 07/23/2017 4:51 PM)  The physical exam findings are as follows: Note:WDWN in NAD Eyes: Pupils equal, round; sclera anicteric HENT: Oral mucosa moist; good dentition Neck: No masses palpated, no thyromegaly Lungs: CTA bilaterally; normal respiratory effort CV: Regular rate and rhythm; no murmurs; extremities well-perfused with no edema Abd: +bowel sounds, soft, mild epigastric tenderness, no palpable organomegaly; no palpable hernias Skin: Warm, dry; no sign of jaundice Psychiatric - alert and oriented x 4; calm mood and affect    Assessment & Plan Rodman Key K. Detria Cummings MD; 07/23/2017 3:09 PM)  CHRONIC CHOLECYSTITIS WITH CALCULUS (K80.10)  Current Plans Schedule for Surgery - Laparoscopic cholecystectomy with intraoperative cholangiogram. The surgical procedure has been discussed with the patient. Potential risks, benefits, alternative treatments, and expected outcomes have been explained. All of the patient's questions at this time have been answered. The likelihood of reaching the patient's treatment goal is good. The patient understand the proposed surgical procedure and wishes to proceed.  Cory [name]. Georgette Dover, MD, [facility]  General/ Trauma Surgery  07/23/2017 4:51 PM

## 2017-08-04 ENCOUNTER — Encounter (HOSPITAL_COMMUNITY): Payer: Self-pay | Admitting: *Deleted

## 2017-08-04 ENCOUNTER — Other Ambulatory Visit: Payer: Self-pay

## 2017-08-04 NOTE — Progress Notes (Signed)
Spoke with pt for pre-op call. Pt denies cardiac history, chest pain, sob or diabetes. Pt states he has hx of too many red blood cells and has to have them drawn off every so often.

## 2017-08-05 ENCOUNTER — Ambulatory Visit (HOSPITAL_COMMUNITY): Payer: Medicare Other | Admitting: Anesthesiology

## 2017-08-05 ENCOUNTER — Ambulatory Visit (HOSPITAL_COMMUNITY): Payer: Medicare Other

## 2017-08-05 ENCOUNTER — Ambulatory Visit (HOSPITAL_COMMUNITY)
Admission: RE | Admit: 2017-08-05 | Discharge: 2017-08-05 | Disposition: A | Discharge status: Home / Self Care | Payer: Medicare Other | Source: Ambulatory Visit | Attending: Surgery | Admitting: Surgery

## 2017-08-05 ENCOUNTER — Encounter (HOSPITAL_COMMUNITY): Payer: Self-pay

## 2017-08-05 ENCOUNTER — Encounter (HOSPITAL_COMMUNITY)
Admission: RE | Disposition: A | Discharge status: Home / Self Care | Payer: Self-pay | Source: Ambulatory Visit | Attending: Surgery

## 2017-08-05 DIAGNOSIS — D649 Anemia, unspecified: Secondary | ICD-10-CM | POA: Insufficient documentation

## 2017-08-05 DIAGNOSIS — K219 Gastro-esophageal reflux disease without esophagitis: Secondary | ICD-10-CM | POA: Diagnosis not present

## 2017-08-05 DIAGNOSIS — K8012 Calculus of gallbladder with acute and chronic cholecystitis without obstruction: Secondary | ICD-10-CM | POA: Diagnosis not present

## 2017-08-05 DIAGNOSIS — I10 Essential (primary) hypertension: Secondary | ICD-10-CM | POA: Insufficient documentation

## 2017-08-05 DIAGNOSIS — K828 Other specified diseases of gallbladder: Secondary | ICD-10-CM | POA: Insufficient documentation

## 2017-08-05 DIAGNOSIS — Z79899 Other long term (current) drug therapy: Secondary | ICD-10-CM | POA: Diagnosis not present

## 2017-08-05 DIAGNOSIS — Z7982 Long term (current) use of aspirin: Secondary | ICD-10-CM | POA: Diagnosis not present

## 2017-08-05 DIAGNOSIS — D751 Secondary polycythemia: Secondary | ICD-10-CM | POA: Diagnosis not present

## 2017-08-05 DIAGNOSIS — K8010 Calculus of gallbladder with chronic cholecystitis without obstruction: Secondary | ICD-10-CM | POA: Diagnosis not present

## 2017-08-05 DIAGNOSIS — Z87891 Personal history of nicotine dependence: Secondary | ICD-10-CM | POA: Diagnosis not present

## 2017-08-05 DIAGNOSIS — Z419 Encounter for procedure for purposes other than remedying health state, unspecified: Secondary | ICD-10-CM

## 2017-08-05 DIAGNOSIS — I491 Atrial premature depolarization: Secondary | ICD-10-CM | POA: Diagnosis not present

## 2017-08-05 DIAGNOSIS — I499 Cardiac arrhythmia, unspecified: Secondary | ICD-10-CM | POA: Diagnosis not present

## 2017-08-05 HISTORY — DX: Gastro-esophageal reflux disease without esophagitis: K21.9

## 2017-08-05 HISTORY — PX: CHOLECYSTECTOMY: SHX55

## 2017-08-05 HISTORY — DX: Personal history of urinary calculi: Z87.442

## 2017-08-05 LAB — COMPREHENSIVE METABOLIC PANEL
ALBUMIN: 4 g/dL (ref 3.5–5.0)
ALK PHOS: 74 U/L (ref 38–126)
ALT: 25 U/L (ref 17–63)
AST: 24 U/L (ref 15–41)
Anion gap: 13 (ref 5–15)
BILIRUBIN TOTAL: 1.3 mg/dL — AB (ref 0.3–1.2)
BUN: 16 mg/dL (ref 6–20)
CALCIUM: 9 mg/dL (ref 8.9–10.3)
CO2: 21 mmol/L — ABNORMAL LOW (ref 22–32)
CREATININE: 1.17 mg/dL (ref 0.61–1.24)
Chloride: 107 mmol/L (ref 101–111)
GFR calc non Af Amer: 57 mL/min — ABNORMAL LOW (ref >60)
GLUCOSE: 111 mg/dL — AB (ref 65–99)
Potassium: 3.8 mmol/L (ref 3.5–5.1)
Sodium: 141 mmol/L (ref 135–145)
TOTAL PROTEIN: 6.9 g/dL (ref 6.5–8.1)

## 2017-08-05 LAB — CBC
HCT: 48.2 % (ref 39.0–52.0)
Hemoglobin: 15.9 g/dL (ref 13.0–17.0)
MCH: 25 pg — ABNORMAL LOW (ref 26.0–34.0)
MCHC: 33 g/dL (ref 30.0–36.0)
MCV: 75.8 fL — ABNORMAL LOW (ref 78.0–100.0)
PLATELETS: 158 10*3/uL (ref 150–400)
RBC: 6.36 MIL/uL — ABNORMAL HIGH (ref 4.22–5.81)
RDW: 16.8 % — ABNORMAL HIGH (ref 11.5–15.5)
WBC: 4.2 10*3/uL (ref 4.0–10.5)

## 2017-08-05 SURGERY — LAPAROSCOPIC CHOLECYSTECTOMY WITH INTRAOPERATIVE CHOLANGIOGRAM
Anesthesia: General | Site: Abdomen

## 2017-08-05 MED ORDER — DEXAMETHASONE SODIUM PHOSPHATE 10 MG/ML IJ SOLN
INTRAMUSCULAR | Status: AC
  Filled 2017-08-05: qty 1

## 2017-08-05 MED ORDER — IOPAMIDOL (ISOVUE-300) INJECTION 61%
INTRAVENOUS | Status: AC
  Filled 2017-08-05: qty 50

## 2017-08-05 MED ORDER — PROPOFOL 10 MG/ML IV BOLUS
INTRAVENOUS | Status: AC
  Filled 2017-08-05: qty 20

## 2017-08-05 MED ORDER — SUGAMMADEX SODIUM 200 MG/2ML IV SOLN
INTRAVENOUS | Status: DC | PRN
  Administered 2017-08-05: 200 mg via INTRAVENOUS

## 2017-08-05 MED ORDER — SUCCINYLCHOLINE CHLORIDE 200 MG/10ML IV SOSY
PREFILLED_SYRINGE | INTRAVENOUS | Status: AC
  Filled 2017-08-05: qty 10

## 2017-08-05 MED ORDER — CHLORHEXIDINE GLUCONATE CLOTH 2 % EX PADS: 6.0000 | MEDICATED_PAD | Freq: Once | CUTANEOUS | Status: DC

## 2017-08-05 MED ORDER — CEFAZOLIN SODIUM-DEXTROSE 2-4 GM/100ML-% IV SOLN
2.0000 g | INTRAVENOUS | Status: AC
  Administered 2017-08-05: 2 g via INTRAVENOUS

## 2017-08-05 MED ORDER — SUCCINYLCHOLINE CHLORIDE 20 MG/ML IJ SOLN
INTRAMUSCULAR | Status: DC | PRN
  Administered 2017-08-05: 100 mg via INTRAVENOUS

## 2017-08-05 MED ORDER — PROPOFOL 10 MG/ML IV BOLUS
INTRAVENOUS | Status: DC | PRN
  Administered 2017-08-05: 110 mg via INTRAVENOUS

## 2017-08-05 MED ORDER — CEFAZOLIN SODIUM-DEXTROSE 2-4 GM/100ML-% IV SOLN
INTRAVENOUS | Status: AC
  Filled 2017-08-05: qty 100

## 2017-08-05 MED ORDER — OXYCODONE HCL 5 MG/5ML PO SOLN: 5.0000 mg | Freq: Once | ORAL | Status: AC | PRN

## 2017-08-05 MED ORDER — FENTANYL CITRATE (PF) 100 MCG/2ML IJ SOLN
INTRAMUSCULAR | Status: DC | PRN
  Administered 2017-08-05: 100 ug via INTRAVENOUS

## 2017-08-05 MED ORDER — MEPERIDINE HCL 25 MG/ML IJ SOLN: 6.2500 mg | INTRAMUSCULAR | Status: DC | PRN

## 2017-08-05 MED ORDER — SODIUM CHLORIDE 0.9 % IV SOLN
INTRAVENOUS | Status: DC | PRN
  Administered 2017-08-05: 9 mL

## 2017-08-05 MED ORDER — ROCURONIUM BROMIDE 100 MG/10ML IV SOLN
INTRAVENOUS | Status: DC | PRN
  Administered 2017-08-05: 30 mg via INTRAVENOUS

## 2017-08-05 MED ORDER — OXYCODONE HCL 5 MG PO TABS
5.0000 mg | ORAL_TABLET | Freq: Once | ORAL | Status: AC | PRN
  Administered 2017-08-05: 5 mg via ORAL

## 2017-08-05 MED ORDER — MIDAZOLAM HCL 2 MG/2ML IJ SOLN
INTRAMUSCULAR | Status: AC
  Filled 2017-08-05: qty 2

## 2017-08-05 MED ORDER — FENTANYL CITRATE (PF) 250 MCG/5ML IJ SOLN
INTRAMUSCULAR | Status: AC
  Filled 2017-08-05: qty 5

## 2017-08-05 MED ORDER — HYDROMORPHONE HCL 1 MG/ML IJ SOLN: 0.2500 mg | INTRAMUSCULAR | Status: DC | PRN

## 2017-08-05 MED ORDER — DEXAMETHASONE SODIUM PHOSPHATE 10 MG/ML IJ SOLN
INTRAMUSCULAR | Status: DC | PRN
  Administered 2017-08-05: 10 mg via INTRAVENOUS

## 2017-08-05 MED ORDER — SODIUM CHLORIDE 0.9 % IR SOLN
Status: DC | PRN
  Administered 2017-08-05: 1000 mL

## 2017-08-05 MED ORDER — ONDANSETRON HCL 4 MG/2ML IJ SOLN
INTRAMUSCULAR | Status: AC
  Filled 2017-08-05: qty 2

## 2017-08-05 MED ORDER — OXYCODONE HCL 5 MG PO TABS
ORAL_TABLET | ORAL | Status: AC
  Filled 2017-08-05: qty 1

## 2017-08-05 MED ORDER — LIDOCAINE 2% (20 MG/ML) 5 ML SYRINGE
INTRAMUSCULAR | Status: AC
  Filled 2017-08-05: qty 5

## 2017-08-05 MED ORDER — LIDOCAINE HCL (CARDIAC) 20 MG/ML IV SOLN
INTRAVENOUS | Status: DC | PRN
  Administered 2017-08-05: 100 mg via INTRAVENOUS

## 2017-08-05 MED ORDER — 0.9 % SODIUM CHLORIDE (POUR BTL) OPTIME
TOPICAL | Status: DC | PRN
  Administered 2017-08-05: 1000 mL

## 2017-08-05 MED ORDER — BUPIVACAINE-EPINEPHRINE (PF) 0.5% -1:200000 IJ SOLN
INTRAMUSCULAR | Status: AC
  Filled 2017-08-05: qty 30

## 2017-08-05 MED ORDER — HYDROCODONE-ACETAMINOPHEN 5-325 MG PO TABS: 1.0000 | ORAL_TABLET | Freq: Four times a day (QID) | ORAL | 0 refills | Status: DC | PRN

## 2017-08-05 MED ORDER — PROMETHAZINE HCL 25 MG/ML IJ SOLN: 6.2500 mg | INTRAMUSCULAR | Status: DC | PRN

## 2017-08-05 MED ORDER — SUGAMMADEX SODIUM 200 MG/2ML IV SOLN
INTRAVENOUS | Status: AC
  Filled 2017-08-05: qty 2

## 2017-08-05 MED ORDER — ROCURONIUM BROMIDE 10 MG/ML (PF) SYRINGE
PREFILLED_SYRINGE | INTRAVENOUS | Status: AC
  Filled 2017-08-05: qty 5

## 2017-08-05 MED ORDER — ONDANSETRON HCL 4 MG/2ML IJ SOLN
INTRAMUSCULAR | Status: DC | PRN
  Administered 2017-08-05: 4 mg via INTRAVENOUS

## 2017-08-05 MED ORDER — LACTATED RINGERS IV SOLN
INTRAVENOUS | Status: DC
  Administered 2017-08-05 (×2): via INTRAVENOUS

## 2017-08-05 MED ORDER — BUPIVACAINE-EPINEPHRINE 0.25% -1:200000 IJ SOLN
INTRAMUSCULAR | Status: DC | PRN
  Administered 2017-08-05: 11 mL

## 2017-08-05 SURGICAL SUPPLY — 45 items
APL SKNCLS STERI-STRIP NONHPOA (GAUZE/BANDAGES/DRESSINGS) ×1
APPLIER CLIP ROT 10 11.4 M/L (STAPLE) ×3
APR CLP MED LRG 11.4X10 (STAPLE) ×1
BAG SPEC RTRVL LRG 6X4 10 (ENDOMECHANICALS) ×1
BENZOIN TINCTURE PRP APPL 2/3 (GAUZE/BANDAGES/DRESSINGS) ×3 IMPLANT
BLADE CLIPPER SURG (BLADE) ×2 IMPLANT
CANISTER SUCT 3000ML PPV (MISCELLANEOUS) ×3 IMPLANT
CHLORAPREP W/TINT 26ML (MISCELLANEOUS) ×3 IMPLANT
CLIP APPLIE ROT 10 11.4 M/L (STAPLE) ×1 IMPLANT
CLOSURE WOUND 1/2 X4 (GAUZE/BANDAGES/DRESSINGS) ×3
COVER MAYO STAND STRL (DRAPES) ×3 IMPLANT
COVER SURGICAL LIGHT HANDLE (MISCELLANEOUS) ×3 IMPLANT
DRAPE C-ARM 42X72 X-RAY (DRAPES) ×3 IMPLANT
DRSG TEGADERM 2-3/8X2-3/4 SM (GAUZE/BANDAGES/DRESSINGS) ×5 IMPLANT
DRSG TEGADERM 4X4.75 (GAUZE/BANDAGES/DRESSINGS) ×3 IMPLANT
ELECT REM PT RETURN 9FT ADLT (ELECTROSURGICAL) ×3
ELECTRODE REM PT RTRN 9FT ADLT (ELECTROSURGICAL) ×1 IMPLANT
FILTER SMOKE EVAC LAPAROSHD (FILTER) ×3 IMPLANT
GAUZE SPONGE 2X2 8PLY STRL LF (GAUZE/BANDAGES/DRESSINGS) ×1 IMPLANT
GLOVE BIO SURGEON STRL SZ7 (GLOVE) ×3 IMPLANT
GLOVE BIOGEL PI IND STRL 7.5 (GLOVE) ×1 IMPLANT
GLOVE BIOGEL PI INDICATOR 7.5 (GLOVE) ×2
GOWN STRL REUS W/ TWL LRG LVL3 (GOWN DISPOSABLE) ×3 IMPLANT
GOWN STRL REUS W/TWL LRG LVL3 (GOWN DISPOSABLE) ×9
KIT BASIN OR (CUSTOM PROCEDURE TRAY) ×3 IMPLANT
KIT ROOM TURNOVER OR (KITS) ×3 IMPLANT
NS IRRIG 1000ML POUR BTL (IV SOLUTION) ×3 IMPLANT
PAD ARMBOARD 7.5X6 YLW CONV (MISCELLANEOUS) ×3 IMPLANT
POUCH SPECIMEN RETRIEVAL 10MM (ENDOMECHANICALS) ×3 IMPLANT
SCISSORS LAP 5X35 DISP (ENDOMECHANICALS) ×3 IMPLANT
SET CHOLANGIOGRAPH 5 50 .035 (SET/KITS/TRAYS/PACK) ×3 IMPLANT
SET IRRIG TUBING LAPAROSCOPIC (IRRIGATION / IRRIGATOR) ×3 IMPLANT
SLEEVE ENDOPATH XCEL 5M (ENDOMECHANICALS) ×3 IMPLANT
SPECIMEN JAR SMALL (MISCELLANEOUS) ×3 IMPLANT
SPONGE GAUZE 2X2 STER 10/PKG (GAUZE/BANDAGES/DRESSINGS) ×2
STRIP CLOSURE SKIN 1/2X4 (GAUZE/BANDAGES/DRESSINGS) ×4 IMPLANT
SUT MNCRL AB 4-0 PS2 18 (SUTURE) ×3 IMPLANT
TOWEL OR 17X24 6PK STRL BLUE (TOWEL DISPOSABLE) ×3 IMPLANT
TOWEL OR 17X26 10 PK STRL BLUE (TOWEL DISPOSABLE) ×3 IMPLANT
TRAY LAPAROSCOPIC MC (CUSTOM PROCEDURE TRAY) ×3 IMPLANT
TROCAR XCEL BLUNT TIP 100MML (ENDOMECHANICALS) ×3 IMPLANT
TROCAR XCEL NON-BLD 11X100MML (ENDOMECHANICALS) ×3 IMPLANT
TROCAR XCEL NON-BLD 5MMX100MML (ENDOMECHANICALS) ×3 IMPLANT
TUBING INSUFFLATION (TUBING) ×3 IMPLANT
WATER STERILE IRR 1000ML POUR (IV SOLUTION) ×3 IMPLANT

## 2017-08-05 NOTE — Discharge Instructions (Signed)
[facility] ______[facility] °LAPAROSCOPIC SURGERY: POST OP INSTRUCTIONS °Always review your discharge instruction sheet given to you by the facility where your surgery was performed. °IF YOU HAVE DISABILITY OR FAMILY LEAVE FORMS, YOU MUST BRING THEM TO THE OFFICE FOR PROCESSING.   °DO NOT GIVE THEM TO YOUR DOCTOR. ° °1. A prescription for pain medication may be given to you upon discharge.  Take your pain medication as prescribed, if needed.  If narcotic pain medicine is not needed, then you may take acetaminophen (Tylenol) or ibuprofen (Advil) as needed. °2. Take your usually prescribed medications unless otherwise directed. °3. If you need a refill on your pain medication, please contact your pharmacy.  They will contact our office to request authorization. Prescriptions will not be filled after 5pm or on week-ends. °4. You should follow a light diet the first few days after arrival home, such as soup and crackers, etc.  Be sure to include lots of fluids daily. °5. Most patients will experience some swelling and bruising in the area of the incisions.  Ice packs will help.  Swelling and bruising can take several days to resolve.  °6. It is common to experience some constipation if taking pain medication after surgery.  Increasing fluid intake and taking a stool softener (such as Colace) will usually help or prevent this problem from occurring.  A mild laxative (Milk of Magnesia or Miralax) should be taken according to package instructions if there are no bowel movements after 48 hours. °7. Unless discharge instructions indicate otherwise, you may remove your bandages 24-48 hours after surgery, and you may shower at that time.  You may have steri-strips (small skin tapes) in place directly over the incision.  These strips should be left on the skin for 7-10 days.  If your surgeon used skin glue on the incision, you may shower in 24 hours.  The glue will flake off over the next 2-3 weeks.  Any sutures or  staples will be removed at the office during your follow-up visit. °8. ACTIVITIES:  You may resume regular (light) daily activities beginning the next day--such as daily self-care, walking, climbing stairs--gradually increasing activities as tolerated.  You may have sexual intercourse when it is comfortable.  Refrain from any heavy lifting or straining until approved by your doctor. °a. You may drive when you are no longer taking prescription pain medication, you can comfortably wear a seatbelt, and you can safely maneuver your car and apply brakes. °b. RETURN TO WORK:  __________________________________________________________ °9. You should see your doctor in the office for a follow-up appointment approximately 2-3 weeks after your surgery.  Make sure that you call for this appointment within a day or two after you arrive home to insure a convenient appointment time. °10. OTHER INSTRUCTIONS: __________________________________________________________________________________________________________________________ __________________________________________________________________________________________________________________________ °WHEN TO CALL YOUR DOCTOR: °1. Fever over 101.0 °2. Inability to urinate °3. Continued bleeding from incision. °4. Increased pain, redness, or drainage from the incision. °5. Increasing abdominal pain ° °The clinic staff is available to answer your questions during regular business hours.  Please don’t hesitate to call and ask to speak to one of the nurses for clinical concerns.  If you have a medical emergency, go to the nearest emergency room or call 911.  A surgeon from [facility] is always on call at the hospital. °[address], [address], [place], [place]  [address] ? [address] [address], , [place]   [address] °[phone] ? [phone] ? FAX [phone] °Web site:   [email or web address] °

## 2017-08-05 NOTE — Anesthesia Procedure Notes (Signed)
Procedure Name: Intubation Date/Time: 08/05/2017 11:41 AM Performed by: Jenne Campus, CRNA Pre-anesthesia Checklist: Patient identified, Emergency Drugs available, Suction available and Patient being monitored Patient Re-evaluated:Patient Re-evaluated prior to induction Oxygen Delivery Method: Circle System Utilized Preoxygenation: Pre-oxygenation with 100% oxygen Induction Type: IV induction Ventilation: Mask ventilation without difficulty Laryngoscope Size: Mac and 4 Grade View: Grade III Tube type: Oral Tube size: 7.5 mm Number of attempts: 1 Airway Equipment and Method: Stylet and Oral airway Placement Confirmation: ETT inserted through vocal cords under direct vision,  positive ETCO2 and breath sounds checked- equal and bilateral Secured at: 21 cm Tube secured with: Tape Dental Injury: Teeth and Oropharynx as per pre-operative assessment  Difficulty Due To: Difficulty was anticipated, Difficult Airway- due to reduced neck mobility, Difficult Airway- due to anterior larynx and Difficult Airway- due to limited oral opening Future Recommendations: Recommend- induction with short-acting agent, and alternative techniques readily available Comments: Smooth IV induction. Easy mask. DL x 1 Mil 2 by CRNA. Grade 4 view. DL x 1 MAC 4 by Dr Sabra Heck. Grade 3 view. Atraumatic intubation. +ETCO2. BBSE.

## 2017-08-05 NOTE — Transfer of Care (Signed)
Immediate Anesthesia Transfer of Care Note  Patient: Cory [name]  Procedure(s) Performed: LAPAROSCOPIC CHOLECYSTECTOMY WITH INTRAOPERATIVE CHOLANGIOGRAM (N/A Abdomen)  Patient Location: PACU  Anesthesia Type:General  Level of Consciousness: awake, oriented and patient cooperative  Airway & Oxygen Therapy: Patient Spontanous Breathing and Patient connected to face mask oxygen  Post-op Assessment: Report given to RN and Post -op Vital signs reviewed and stable  Post vital signs: Reviewed  Last Vitals:  Vitals:   08/05/17 0854 08/05/17 1254  BP: (!) 168/91   Pulse: 76   Resp: 17   Temp: 36.8 C (P) 36.9 C  SpO2: 99%     Last Pain:  Vitals:   08/05/17 0854  TempSrc: Oral      Patients Stated Pain Goal: 5 (08/23/34 1224)  Complications: No apparent anesthesia complications

## 2017-08-05 NOTE — Op Note (Signed)
Laparoscopic Cholecystectomy with IOC Procedure Note  Indications: This is a 82 year old male in good health who presents with about 1 year history of intermittent postprandial epigastric abdominal pain. This does radiate through to his back. Over the last month or so he has had pain in his right flank. He underwent lab tests at Dr. Delene Ruffini office. This showed abnormal liver functions with a bilirubin of 2.0 AST and ALT in the 800-900s and elevated alkaline phosphatase. She obtained an ultrasound that showed gallstones but no sign of wall thickening. No Murphy sign. No common bile duct dilatation. The patient is now referred for surgical evaluation. He does report frequent postprandial epigastric discomfort. Sometimes he induces vomiting to help relieve his discomfort. He denies any spontaneous vomiting or diarrhea   Pre-operative Diagnosis: Calculus of gallbladder with other cholecystitis, without mention of obstruction  Post-operative Diagnosis: Same  Surgeon: Maia Petties   Assistants: none  Anesthesia: General endotracheal anesthesia  ASA Class: 2  Procedure Details  The patient was seen again in the Holding Room. The risks, benefits, complications, treatment options, and expected outcomes were discussed with the patient. The possibilities of reaction to medication, pulmonary aspiration, perforation of viscus, bleeding, recurrent infection, finding a normal gallbladder, the need for additional procedures, failure to diagnose a condition, the possible need to convert to an open procedure, and creating a complication requiring transfusion or operation were discussed with the patient. The likelihood of improving the patient's symptoms with return to their baseline status is good.  The patient and/or family concurred with the proposed plan, giving informed consent. The site of surgery properly noted. The patient was taken to Operating Room, identified as Van Clines and the  procedure verified as Laparoscopic Cholecystectomy with Intraoperative Cholangiogram. A Time Out was held and the above information confirmed.  Prior to the induction of general anesthesia, antibiotic prophylaxis was administered. General endotracheal anesthesia was then administered and tolerated well. After the induction, the abdomen was prepped with Chloraprep and draped in the sterile fashion. The patient was positioned in the supine position.  Local anesthetic agent was injected into the skin above the umbilicus and an incision made. We dissected down to the abdominal fascia with blunt dissection.  The fascia was incised vertically and we entered the peritoneal cavity bluntly.  A pursestring suture of 0-Vicryl was placed around the fascial opening.  The Hasson cannula was inserted and secured with the stay suture.  Pneumoperitoneum was then created with CO2 and tolerated well without any adverse changes in the patient's vital signs. An 11-mm port was placed in the subxiphoid position.  Two 5-mm ports were placed in the right upper quadrant. All skin incisions were infiltrated with a local anesthetic agent before making the incision and placing the trocars.   We positioned the patient in reverse Trendelenburg, tilted slightly to the patient's left.  There are significant omental adhesions to the edge of the liver, obscuring the gallbladder.  We began taking these adhesions with the hook cautery.  The gallbladder was identified, the fundus grasped and retracted cephalad. Adhesions were lysed bluntly and with the electrocautery where indicated, taking care not to injure any adjacent organs or viscus. The infundibulum was grasped and retracted laterally, exposing the peritoneum overlying the triangle of Calot. This was then divided and exposed in a blunt fashion. A critical view of the cystic duct and cystic artery was obtained.  The cystic duct was clearly identified and bluntly dissected circumferentially.  The cystic duct was ligated with a  clip distally.   An incision was made in the cystic duct and the [facility] cholangiogram catheter introduced. The catheter was secured using a clip. A cholangiogram was then obtained which showed good visualization of the distal and proximal biliary tree with no sign of filling defects or obstruction.  Contrast flowed easily into the duodenum. The catheter was then removed.   The cystic duct was then ligated with clips and divided. The cystic artery was identified, dissected free, ligated with clips and divided as well.   The gallbladder was dissected from the liver bed in retrograde fashion with the electrocautery. The gallbladder was removed and placed in an Endocatch sac. The liver bed was irrigated and inspected. Hemostasis was achieved with the electrocautery. Copious irrigation was utilized and was repeatedly aspirated until clear.  The gallbladder and Endocatch sac were then removed through the umbilical port site.  The pursestring suture was used to close the umbilical fascia.    We again inspected the right upper quadrant for hemostasis.  Pneumoperitoneum was released as we removed the trocars.  4-0 Monocryl was used to close the skin.   Benzoin, steri-strips, and clean dressings were applied. The patient was then extubated and brought to the recovery room in stable condition. Instrument, sponge, and needle counts were correct at closure and at the conclusion of the case.   Findings: Cholecystitis with Cholelithiasis  Estimated Blood Loss: Minimal         Drains: none         Specimens: Gallbladder           Complications: None; patient tolerated the procedure well.         Disposition: PACU - hemodynamically stable.         Condition: stable  Cory [name]. Georgette Dover, MD, [facility]  General/ Trauma Surgery  08/05/2017 12:44 PM

## 2017-08-05 NOTE — Anesthesia Preprocedure Evaluation (Addendum)
Anesthesia Evaluation  Patient identified by MRN, date of birth, ID band Patient awake    Reviewed: Allergy & Precautions, H&P , NPO status , Patient's Chart, lab work & pertinent test results  Airway Mallampati: II  TM Distance: >3 FB Neck ROM: Limited    Dental no notable dental hx. (+) Poor Dentition, Dental Advisory Given,    Pulmonary former smoker,    Pulmonary exam normal breath sounds clear to auscultation       Cardiovascular hypertension, Pt. on medications Normal cardiovascular exam Rhythm:Regular Rate:Normal     Neuro/Psych negative neurological ROS  negative psych ROS   GI/Hepatic negative GI ROS, Neg liver ROS, GERD  ,  Endo/Other  negative endocrine ROS  Renal/GU negative Renal ROS     Musculoskeletal negative musculoskeletal ROS (+)   Abdominal   Peds  Hematology  (+) anemia ,   Anesthesia Other Findings   Reproductive/Obstetrics negative OB ROS                            Anesthesia Physical  Anesthesia Plan  ASA: II  Anesthesia Plan: General   Post-op Pain Management:    Induction: Intravenous  PONV Risk Score and Plan: 1  Airway Management Planned: Oral ETT  Additional Equipment:   Intra-op Plan:   Post-operative Plan: Extubation in OR  Informed Consent: I have reviewed the patients History and Physical, chart, labs and discussed the procedure including the risks, benefits and alternatives for the proposed anesthesia with the patient or authorized representative who has indicated his/her understanding and acceptance.   Dental advisory given  Plan Discussed with: CRNA  Anesthesia Plan Comments:                                          Anesthesia Evaluation  Patient identified by MRN, date of birth, ID band Patient awake    Reviewed: Allergy & Precautions, H&P , NPO status , Patient's Chart, lab work & pertinent test  results  Airway Mallampati: II TM Distance: >3 FB Neck ROM: Full    Dental no notable dental hx.    Pulmonary former smoker,  breath sounds clear to auscultation  Pulmonary exam normal       Cardiovascular hypertension, Pt. on medications Rhythm:Regular Rate:Normal     Neuro/Psych negative neurological ROS  negative psych ROS   GI/Hepatic negative GI ROS, Neg liver ROS,   Endo/Other  negative endocrine ROS  Renal/GU negative Renal ROS     Musculoskeletal negative musculoskeletal ROS (+)   Abdominal   Peds  Hematology  (+) anemia ,   Anesthesia Other Findings   Reproductive/Obstetrics negative OB ROS                           Anesthesia Physical Anesthesia Plan  ASA: II  Anesthesia Plan: General   Post-op Pain Management:    Induction: Intravenous  Airway Management Planned: Oral ETT  Additional Equipment:   Intra-op Plan:   Post-operative Plan: Extubation in OR  Informed Consent: I have reviewed the patients History and Physical, chart, labs and discussed the procedure including the risks, benefits and alternatives for the proposed anesthesia with the patient or authorized representative who has indicated his/her understanding and acceptance.   Dental advisory given  Plan Discussed with: CRNA  Anesthesia Plan Comments:  Anesthesia Quick Evaluation  Anesthesia Quick Evaluation

## 2017-08-05 NOTE — Interval H&P Note (Signed)
History and Physical Interval Note:  08/05/2017 9:52 AM  Cory [name]  has presented today for surgery, with the diagnosis of CHRONIC CALCULUS CHOLECYSTITIS  The various methods of treatment have been discussed with the patient and family. After consideration of risks, benefits and other options for treatment, the patient has consented to  Procedure(s): LAPAROSCOPIC CHOLECYSTECTOMY WITH INTRAOPERATIVE CHOLANGIOGRAM (N/A) as a surgical intervention .  The patient's history has been reviewed, patient examined, no change in status, stable for surgery.  I have reviewed the patient's chart and labs.  Questions were answered to the patient's satisfaction.     Maia Petties

## 2017-08-06 ENCOUNTER — Encounter (HOSPITAL_COMMUNITY): Payer: Self-pay | Admitting: Surgery

## 2017-08-06 NOTE — Anesthesia Postprocedure Evaluation (Signed)
Anesthesia Post Note  Patient: Cory [name]  Procedure(s) Performed: LAPAROSCOPIC CHOLECYSTECTOMY WITH INTRAOPERATIVE CHOLANGIOGRAM (N/A Abdomen)     Patient location during evaluation: PACU Anesthesia Type: General Level of consciousness: awake and alert Pain management: pain level controlled Vital Signs Assessment: post-procedure vital signs reviewed and stable Respiratory status: spontaneous breathing, nonlabored ventilation and respiratory function stable Cardiovascular status: blood pressure returned to baseline and stable Postop Assessment: no apparent nausea or vomiting Anesthetic complications: no    Last Vitals:  Vitals:   08/05/17 1330 08/05/17 1346  BP:  (!) 161/91  Pulse: 81 85  Resp: 16 14  Temp:  36.9 C  SpO2: 94% 94%    Last Pain:  Vitals:   08/05/17 1352  TempSrc:   PainSc: 2                  Lynda Rainwater

## 2017-08-26 DIAGNOSIS — R740 Nonspecific elevation of levels of transaminase and lactic acid dehydrogenase [LDH]: Secondary | ICD-10-CM | POA: Diagnosis not present

## 2017-09-01 ENCOUNTER — Inpatient Hospital Stay: Payer: Medicare Other | Attending: Hematology and Oncology

## 2017-09-01 ENCOUNTER — Inpatient Hospital Stay: Payer: Medicare Other

## 2017-09-01 VITALS — BP 122/80 | HR 77 | Temp 98.1°F | Resp 18

## 2017-09-01 DIAGNOSIS — D751 Secondary polycythemia: Secondary | ICD-10-CM

## 2017-09-01 LAB — CBC WITH DIFFERENTIAL/PLATELET
BASOS ABS: 0 10*3/uL (ref 0.0–0.1)
Basophils Relative: 1 %
EOS ABS: 0.2 10*3/uL (ref 0.0–0.5)
EOS PCT: 4 %
HCT: 47.9 % (ref 38.4–49.9)
Hemoglobin: 15.5 g/dL (ref 13.0–17.1)
Lymphocytes Relative: 43 %
Lymphs Abs: 1.6 10*3/uL (ref 0.9–3.3)
MCH: 24.6 pg — ABNORMAL LOW (ref 27.2–33.4)
MCHC: 32.4 g/dL (ref 32.0–36.0)
MCV: 76 fL — ABNORMAL LOW (ref 79.3–98.0)
Monocytes Absolute: 0.4 10*3/uL (ref 0.1–0.9)
Monocytes Relative: 10 %
Neutro Abs: 1.6 10*3/uL (ref 1.5–6.5)
Neutrophils Relative %: 42 %
PLATELETS: 158 10*3/uL (ref 140–400)
RBC: 6.3 MIL/uL — AB (ref 4.20–5.82)
RDW: 17 % — ABNORMAL HIGH (ref 11.0–14.6)
WBC: 3.8 10*3/uL — AB (ref 4.0–10.3)

## 2017-09-01 NOTE — Progress Notes (Signed)
Pt HCT 47.9 today.  Per Dr. Alvy Bimler proceed with therapeutic phlebotomy today.  Cory [name] presents today for phlebotomy per MD orders. Phlebotomy procedure started at 1009 via 16 gauge left AC phlebotomy kit and ended at 1016. 534 removed. Patient tolerated procedure well. IV needle removed intact. Pt refused to stay full 30 minutes post. VSS and pt discharged home.

## 2017-09-01 NOTE — Patient Instructions (Signed)

## 2017-09-26 DIAGNOSIS — M549 Dorsalgia, unspecified: Secondary | ICD-10-CM | POA: Diagnosis not present

## 2017-10-27 ENCOUNTER — Inpatient Hospital Stay: Payer: Medicare Other

## 2017-10-27 ENCOUNTER — Inpatient Hospital Stay: Payer: Medicare Other | Attending: Hematology and Oncology

## 2017-10-27 DIAGNOSIS — D751 Secondary polycythemia: Secondary | ICD-10-CM | POA: Insufficient documentation

## 2017-10-27 LAB — CBC WITH DIFFERENTIAL/PLATELET
BASOS ABS: 0 10*3/uL (ref 0.0–0.1)
BASOS PCT: 0 %
Eosinophils Absolute: 0.1 10*3/uL (ref 0.0–0.5)
Eosinophils Relative: 2 %
HEMATOCRIT: 49.3 % (ref 38.4–49.9)
Hemoglobin: 15.6 g/dL (ref 13.0–17.1)
LYMPHS PCT: 40 %
Lymphs Abs: 1.7 10*3/uL (ref 0.9–3.3)
MCH: 23.9 pg — ABNORMAL LOW (ref 27.2–33.4)
MCHC: 31.6 g/dL — ABNORMAL LOW (ref 32.0–36.0)
MCV: 75.5 fL — AB (ref 79.3–98.0)
Monocytes Absolute: 0.6 10*3/uL (ref 0.1–0.9)
Monocytes Relative: 14 %
NEUTROS ABS: 1.8 10*3/uL (ref 1.5–6.5)
NEUTROS PCT: 44 %
Platelets: 180 10*3/uL (ref 140–400)
RBC: 6.53 MIL/uL — AB (ref 4.20–5.82)
RDW: 17.4 % — ABNORMAL HIGH (ref 11.0–14.6)
WBC: 4.1 10*3/uL (ref 4.0–10.3)

## 2017-10-27 NOTE — Patient Instructions (Signed)
Therapeutic Phlebotomy, Care After Refer to this sheet in the next few weeks. These instructions provide you with information about caring for yourself after your procedure. Your health care provider may also give you more specific instructions. Your treatment has been planned according to current medical practices, but problems sometimes occur. Call your health care provider if you have any problems or questions after your procedure. What can I expect after the procedure? After the procedure, it is common to have:  Light-headedness or dizziness. You may feel faint.  Nausea.  Tiredness.  Follow these instructions at home: Activity  Return to your normal activities as directed by your health care provider. Most people can go back to their normal activities right away.  Avoid strenuous physical activity and heavy lifting or pulling for about 5 hours after the procedure. Do not lift anything that is heavier than 10 lb (4.5 kg).  Athletes should avoid strenuous exercise for at least 12 hours.  Change positions slowly for the remainder of the day. This will help to prevent light-headedness or fainting.  If you feel light-headed, lie down until the feeling goes away. Eating and drinking  Be sure to eat well-balanced meals for the next 24 hours.  Drink enough fluid to keep your urine clear or pale yellow.  Avoid drinking alcohol on the day that you had the procedure. Care of the Needle Insertion Site  Keep your bandage dry. You can remove the bandage after about 5 hours or as directed by your health care provider.  If you have bleeding from the needle insertion site, elevate your arm and press firmly on the site until the bleeding stops.  If you have bruising at the site, apply ice to the area: ? Put ice in a plastic bag. ? Place a towel between your skin and the bag. ? Leave the ice on for 20 minutes, 2-3 times a day for the first 24 hours.  If the swelling does not go away after  24 hours, apply a warm, moist washcloth to the area for 20 minutes, 2-3 times a day. General instructions  Avoid smoking for at least 30 minutes after the procedure.  Keep all follow-up visits as directed by your health care provider. It is important to continue with further therapeutic phlebotomy treatments as directed. Contact a health care provider if:  You have redness, swelling, or pain at the needle insertion site.  You have fluid, blood, or pus coming from the needle insertion site.  You feel light-headed, dizzy, or nauseated, and the feeling does not go away.  You notice new bruising at the needle insertion site.  You feel weaker than normal.  You have a fever or chills. Get help right away if:  You have severe nausea or vomiting.  You have chest pain.  You have trouble breathing. This information is not intended to replace advice given to you by your health care provider. Make sure you discuss any questions you have with your health care provider. Document Released: 11/19/2010 Document Revised: 02/17/2016 Document Reviewed: 06/13/2014 Elsevier Interactive Patient Education  2018 [employer]. Therapeutic Phlebotomy Discharge Instructions  - Increase your fluid intake over the next 4 hours  - No smoking for 30 minutes  - Avoid using the affected arm (the one you had the blood drawn from) for heavy lifting or other activities.  - You may resume all normal activities after 30 minutes.  You are to notify the office if you experience:   - Persistent dizziness  and/or lightheadedness -Uncontrolled or excessive bleeding at the site.

## 2017-12-22 ENCOUNTER — Inpatient Hospital Stay: Payer: Medicare Other | Attending: Hematology and Oncology

## 2017-12-22 ENCOUNTER — Inpatient Hospital Stay: Payer: Medicare Other

## 2017-12-22 DIAGNOSIS — D751 Secondary polycythemia: Secondary | ICD-10-CM | POA: Insufficient documentation

## 2017-12-22 LAB — CBC WITH DIFFERENTIAL/PLATELET
BASOS ABS: 0 10*3/uL (ref 0.0–0.1)
BASOS PCT: 1 %
Eosinophils Absolute: 0.1 10*3/uL (ref 0.0–0.5)
Eosinophils Relative: 1 %
HEMATOCRIT: 48.2 % (ref 38.4–49.9)
HEMOGLOBIN: 15.4 g/dL (ref 13.0–17.1)
Lymphocytes Relative: 35 %
Lymphs Abs: 1.4 10*3/uL (ref 0.9–3.3)
MCH: 24 pg — ABNORMAL LOW (ref 27.2–33.4)
MCHC: 31.9 g/dL — ABNORMAL LOW (ref 32.0–36.0)
MCV: 75.2 fL — ABNORMAL LOW (ref 79.3–98.0)
MONO ABS: 0.4 10*3/uL (ref 0.1–0.9)
Monocytes Relative: 11 %
NEUTROS ABS: 2.1 10*3/uL (ref 1.5–6.5)
Neutrophils Relative %: 52 %
Platelets: 165 10*3/uL (ref 140–400)
RBC: 6.41 MIL/uL — ABNORMAL HIGH (ref 4.20–5.82)
RDW: 16.4 % — AB (ref 11.0–14.6)
WBC: 4.1 10*3/uL (ref 4.0–10.3)

## 2017-12-22 NOTE — Progress Notes (Signed)
Patient assessed by myself and he declined to have phlebotomy today "I feel great and I am happy to go home and come back in August". Md Alvy Bimler aware. Patient given copy of labs.

## 2017-12-22 NOTE — Progress Notes (Unsigned)
Pt HCT 48.2. Upon assessment pt states "I feel great" and declines phlebotomy today. Glade Nurse, RN made MD aware. Ok per Dr. Alvy Bimler. Pt discharged in stable condition.

## 2018-01-19 DIAGNOSIS — Z1389 Encounter for screening for other disorder: Secondary | ICD-10-CM | POA: Diagnosis not present

## 2018-01-19 DIAGNOSIS — D751 Secondary polycythemia: Secondary | ICD-10-CM | POA: Diagnosis not present

## 2018-01-19 DIAGNOSIS — Z0000 Encounter for general adult medical examination without abnormal findings: Secondary | ICD-10-CM | POA: Diagnosis not present

## 2018-01-19 DIAGNOSIS — N5201 Erectile dysfunction due to arterial insufficiency: Secondary | ICD-10-CM | POA: Diagnosis not present

## 2018-01-19 DIAGNOSIS — R7309 Other abnormal glucose: Secondary | ICD-10-CM | POA: Diagnosis not present

## 2018-01-19 DIAGNOSIS — M1990 Unspecified osteoarthritis, unspecified site: Secondary | ICD-10-CM | POA: Diagnosis not present

## 2018-01-19 DIAGNOSIS — C61 Malignant neoplasm of prostate: Secondary | ICD-10-CM | POA: Diagnosis not present

## 2018-01-19 DIAGNOSIS — E7800 Pure hypercholesterolemia, unspecified: Secondary | ICD-10-CM | POA: Diagnosis not present

## 2018-01-19 DIAGNOSIS — K219 Gastro-esophageal reflux disease without esophagitis: Secondary | ICD-10-CM | POA: Diagnosis not present

## 2018-01-19 DIAGNOSIS — I10 Essential (primary) hypertension: Secondary | ICD-10-CM | POA: Diagnosis not present

## 2018-01-19 DIAGNOSIS — Z131 Encounter for screening for diabetes mellitus: Secondary | ICD-10-CM | POA: Diagnosis not present

## 2018-02-16 ENCOUNTER — Inpatient Hospital Stay: Payer: Medicare Other | Attending: Hematology and Oncology

## 2018-02-16 ENCOUNTER — Inpatient Hospital Stay: Payer: Medicare Other

## 2018-02-16 VITALS — BP 135/88 | HR 71 | Temp 98.4°F | Resp 16

## 2018-02-16 DIAGNOSIS — D751 Secondary polycythemia: Secondary | ICD-10-CM | POA: Diagnosis not present

## 2018-02-16 LAB — CBC WITH DIFFERENTIAL/PLATELET
Basophils Absolute: 0 10*3/uL (ref 0.0–0.1)
Basophils Relative: 0 %
Eosinophils Absolute: 0 10*3/uL (ref 0.0–0.5)
Eosinophils Relative: 1 %
HEMATOCRIT: 50.6 % — AB (ref 38.4–49.9)
HEMOGLOBIN: 16.1 g/dL (ref 13.0–17.1)
LYMPHS ABS: 1.3 10*3/uL (ref 0.9–3.3)
LYMPHS PCT: 39 %
MCH: 24.1 pg — AB (ref 27.2–33.4)
MCHC: 31.8 g/dL — ABNORMAL LOW (ref 32.0–36.0)
MCV: 75.7 fL — AB (ref 79.3–98.0)
Monocytes Absolute: 0.3 10*3/uL (ref 0.1–0.9)
Monocytes Relative: 10 %
NEUTROS PCT: 50 %
Neutro Abs: 1.7 10*3/uL (ref 1.5–6.5)
Platelets: 145 10*3/uL (ref 140–400)
RBC: 6.68 MIL/uL — AB (ref 4.20–5.82)
RDW: 17.6 % — ABNORMAL HIGH (ref 11.0–14.6)
WBC: 3.3 10*3/uL — AB (ref 4.0–10.3)

## 2018-02-16 NOTE — Patient Instructions (Signed)

## 2018-03-26 ENCOUNTER — Telehealth: Payer: Self-pay

## 2018-03-26 NOTE — Telephone Encounter (Signed)
Spoke with patient while rescheduling his appointment. 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 per 9/26 voice msg return call

## 2018-04-13 ENCOUNTER — Ambulatory Visit: Payer: Medicare Other | Admitting: Hematology and Oncology

## 2018-04-13 ENCOUNTER — Other Ambulatory Visit: Payer: Medicare Other

## 2018-04-13 ENCOUNTER — Encounter: Payer: Self-pay | Admitting: Hematology and Oncology

## 2018-04-13 ENCOUNTER — Inpatient Hospital Stay: Payer: Medicare Other | Attending: Hematology and Oncology

## 2018-04-13 ENCOUNTER — Inpatient Hospital Stay (HOSPITAL_BASED_OUTPATIENT_CLINIC_OR_DEPARTMENT_OTHER): Payer: Medicare Other | Admitting: Hematology and Oncology

## 2018-04-13 ENCOUNTER — Telehealth: Payer: Self-pay | Admitting: Hematology and Oncology

## 2018-04-13 DIAGNOSIS — I10 Essential (primary) hypertension: Secondary | ICD-10-CM | POA: Diagnosis not present

## 2018-04-13 DIAGNOSIS — D751 Secondary polycythemia: Secondary | ICD-10-CM

## 2018-04-13 DIAGNOSIS — Z23 Encounter for immunization: Secondary | ICD-10-CM | POA: Insufficient documentation

## 2018-04-13 LAB — CBC WITH DIFFERENTIAL/PLATELET
Abs Immature Granulocytes: 0.02 10*3/uL (ref 0.00–0.07)
Basophils Absolute: 0 10*3/uL (ref 0.0–0.1)
Basophils Relative: 0 %
EOS ABS: 0 10*3/uL (ref 0.0–0.5)
EOS PCT: 0 %
HCT: 51.2 % (ref 39.0–52.0)
Hemoglobin: 16.1 g/dL (ref 13.0–17.0)
Immature Granulocytes: 0 %
LYMPHS ABS: 1.7 10*3/uL (ref 0.7–4.0)
Lymphocytes Relative: 35 %
MCH: 24.2 pg — ABNORMAL LOW (ref 26.0–34.0)
MCHC: 31.4 g/dL (ref 30.0–36.0)
MCV: 77 fL — AB (ref 80.0–100.0)
MONO ABS: 0.5 10*3/uL (ref 0.1–1.0)
MONOS PCT: 10 %
Neutro Abs: 2.5 10*3/uL (ref 1.7–7.7)
Neutrophils Relative %: 55 %
Platelets: 181 10*3/uL (ref 150–400)
RBC: 6.65 MIL/uL — ABNORMAL HIGH (ref 4.22–5.81)
RDW: 17.9 % — AB (ref 11.5–15.5)
WBC: 4.7 10*3/uL (ref 4.0–10.5)
nRBC: 0 % (ref 0.0–0.2)

## 2018-04-13 NOTE — Telephone Encounter (Signed)
Gave pt avs and calendar. Pt requested to skip thanksgiving week

## 2018-04-13 NOTE — Progress Notes (Signed)
[name] OFFICE PROGRESS NOTE  [name], MD  ASSESSMENT & PLAN:  Polycythemia His hematocrit is getting worse with spacing out phlebotomy sessions I recommend changing his phlebotomy to every 6 weeks consider every other month and I plan to see him again in 6 months for further follow-up.  He agreed He will continue aspirin to prevent risk of thrombosis  Essential hypertension he will continue current medical management. His blood pressure is high could be an element of whitecoat hypertension I recommend close follow-up with primary care doctor for medication adjustment.    No orders of the defined types were placed in this encounter.   INTERVAL HISTORY: Cory [name] 82 y.o. male returns for further follow-up He feels well He denies difficulties getting phlebotomy No dizziness or headache No recent infection, fever or chills The patient denies any recent signs or symptoms of bleeding such as spontaneous epistaxis, hematuria or hematochezia. No recent diagnosis of blood clot  SUMMARY OF HEMATOLOGIC HISTORY:  This is a pleasant gentleman who was found to have abnormal CBC with polycythemia In 2014 he had a bone marrow aspirate and biopsy that came back nondiagnostic for myeloproliferative disorder. JAK 2 mutation testing was negative. The patient had intermittent phlebotomy sessions and remain on aspirin  I have reviewed the past medical history, past surgical history, social history and family history with the patient and they are unchanged from previous note.  ALLERGIES:  has No Known Allergies.  MEDICATIONS:  Current Outpatient Medications  Medication Sig Dispense Refill  . aspirin EC 81 MG tablet Take 81 mg by mouth daily.    . benazepril-hydrochlorthiazide (LOTENSIN HCT) 20-12.5 MG per tablet Take 1 tablet by mouth every morning.    Marland Kitchen oxybutynin (DITROPAN-XL) 10 MG 24 hr tablet Take 10 mg by mouth daily.     No current facility-administered  medications for this visit.      REVIEW OF SYSTEMS:   Constitutional: Denies fevers, chills or night sweats Eyes: Denies blurriness of vision Ears, nose, mouth, throat, and face: Denies mucositis or sore throat Respiratory: Denies cough, dyspnea or wheezes Cardiovascular: Denies palpitation, chest discomfort or lower extremity swelling Gastrointestinal:  Denies nausea, heartburn or change in bowel habits Skin: Denies abnormal skin rashes Lymphatics: Denies new lymphadenopathy or easy bruising Neurological:Denies numbness, tingling or new weaknesses Behavioral/Psych: Mood is stable, no new changes  All other systems were reviewed with the patient and are negative.  PHYSICAL EXAMINATION: ECOG PERFORMANCE STATUS: 0 - Asymptomatic  Vitals:   04/13/18 1229  BP: (!) 158/74  Pulse: 78  Resp: 18  Temp: 97.9 F (36.6 C)  SpO2: 98%   Filed Weights   04/13/18 1229  Weight: 188 lb 9.6 oz (85.5 kg)    GENERAL:alert, no distress and comfortable NEURO: alert & oriented x 3 with fluent speech, no focal motor/sensory deficits  LABORATORY DATA:  I have reviewed the data as listed     Component Value Date/Time   NA 141 08/05/2017 0906   NA 144 03/03/2013 0944   K 3.8 08/05/2017 0906   K 3.6 03/03/2013 0944   CL 107 08/05/2017 0906   CL 106 08/04/2012 0957   CO2 21 (L) 08/05/2017 0906   CO2 26 03/03/2013 0944   GLUCOSE 111 (H) 08/05/2017 0906   GLUCOSE 132 03/03/2013 0944   GLUCOSE 108 (H) 08/04/2012 0957   BUN 16 08/05/2017 0906   BUN 19.6 03/03/2013 0944   CREATININE 1.17 08/05/2017 0906   CREATININE 1.2 03/03/2013 0944  CALCIUM 9.0 08/05/2017 0906   CALCIUM 9.3 03/03/2013 0944   PROT 6.9 08/05/2017 0906   PROT 6.9 03/03/2013 0944   ALBUMIN 4.0 08/05/2017 0906   ALBUMIN 3.9 03/03/2013 0944   AST 24 08/05/2017 0906   AST 20 03/03/2013 0944   ALT 25 08/05/2017 0906   ALT 27 03/03/2013 0944   ALKPHOS 74 08/05/2017 0906   ALKPHOS 69 03/03/2013 0944   BILITOT 1.3 (H)  08/05/2017 0906   BILITOT 1.29 (H) 03/03/2013 0944   GFRNONAA 57 (L) 08/05/2017 0906   GFRAA >60 08/05/2017 0906    No results found for: SPEP, UPEP  Lab Results  Component Value Date   WBC 4.7 04/13/2018   NEUTROABS 2.5 04/13/2018   HGB 16.1 04/13/2018   HCT 51.2 04/13/2018   MCV 77.0 (L) 04/13/2018   PLT 181 04/13/2018      Chemistry      Component Value Date/Time   NA 141 08/05/2017 0906   NA 144 03/03/2013 0944   K 3.8 08/05/2017 0906   K 3.6 03/03/2013 0944   CL 107 08/05/2017 0906   CL 106 08/04/2012 0957   CO2 21 (L) 08/05/2017 0906   CO2 26 03/03/2013 0944   BUN 16 08/05/2017 0906   BUN 19.6 03/03/2013 0944   CREATININE 1.17 08/05/2017 0906   CREATININE 1.2 03/03/2013 0944      Component Value Date/Time   CALCIUM 9.0 08/05/2017 0906   CALCIUM 9.3 03/03/2013 0944   ALKPHOS 74 08/05/2017 0906   ALKPHOS 69 03/03/2013 0944   AST 24 08/05/2017 0906   AST 20 03/03/2013 0944   ALT 25 08/05/2017 0906   ALT 27 03/03/2013 0944   BILITOT 1.3 (H) 08/05/2017 0906   BILITOT 1.29 (H) 03/03/2013 0944     I spent 10 minutes counseling the patient face to face. The total time spent in the appointment was 15 minutes and more than 50% was on counseling.   All questions were answered. The patient knows to call the clinic with any problems, questions or concerns. No barriers to learning was detected.    Heath Lark, MD 10/14/20191:20 PM

## 2018-04-13 NOTE — Assessment & Plan Note (Signed)
he will continue current medical management. His blood pressure is high could be an element of whitecoat hypertension I recommend close follow-up with primary care doctor for medication adjustment.

## 2018-04-13 NOTE — Assessment & Plan Note (Signed)
His hematocrit is getting worse with spacing out phlebotomy sessions I recommend changing his phlebotomy to every 6 weeks consider every other month and I plan to see him again in 6 months for further follow-up.  He agreed He will continue aspirin to prevent risk of thrombosis

## 2018-04-14 ENCOUNTER — Inpatient Hospital Stay: Payer: Medicare Other

## 2018-04-14 VITALS — BP 149/85 | HR 84 | Temp 97.0°F | Resp 14

## 2018-04-14 DIAGNOSIS — I10 Essential (primary) hypertension: Secondary | ICD-10-CM | POA: Diagnosis not present

## 2018-04-14 DIAGNOSIS — Z23 Encounter for immunization: Secondary | ICD-10-CM | POA: Diagnosis not present

## 2018-04-14 DIAGNOSIS — D751 Secondary polycythemia: Secondary | ICD-10-CM | POA: Diagnosis not present

## 2018-04-14 MED ORDER — INFLUENZA VAC SPLIT HIGH-DOSE 0.5 ML IM SUSY
0.5000 mL | PREFILLED_SYRINGE | Freq: Once | INTRAMUSCULAR | Status: AC
  Administered 2018-04-14: 0.5 mL via INTRAMUSCULAR
  Filled 2018-04-14: qty 0.5

## 2018-04-14 NOTE — Progress Notes (Signed)
Phlebotomy kit 16g used for tx. 493g removed today. VSS. Nutrition offered.

## 2018-04-30 DIAGNOSIS — R972 Elevated prostate specific antigen [PSA]: Secondary | ICD-10-CM | POA: Diagnosis not present

## 2018-05-08 DIAGNOSIS — N393 Stress incontinence (female) (male): Secondary | ICD-10-CM | POA: Diagnosis not present

## 2018-05-08 DIAGNOSIS — R351 Nocturia: Secondary | ICD-10-CM | POA: Diagnosis not present

## 2018-06-01 ENCOUNTER — Other Ambulatory Visit: Payer: Self-pay | Admitting: *Deleted

## 2018-06-01 DIAGNOSIS — D751 Secondary polycythemia: Secondary | ICD-10-CM

## 2018-06-02 ENCOUNTER — Inpatient Hospital Stay: Payer: Medicare Other

## 2018-06-02 ENCOUNTER — Inpatient Hospital Stay: Payer: Medicare Other | Attending: Hematology and Oncology

## 2018-06-02 VITALS — BP 138/82 | HR 72 | Temp 98.1°F | Resp 18

## 2018-06-02 DIAGNOSIS — D751 Secondary polycythemia: Secondary | ICD-10-CM

## 2018-06-02 LAB — CBC WITH DIFFERENTIAL (CANCER CENTER ONLY)
Abs Immature Granulocytes: 0.01 10*3/uL (ref 0.00–0.07)
BASOS ABS: 0 10*3/uL (ref 0.0–0.1)
Basophils Relative: 0 %
EOS PCT: 1 %
Eosinophils Absolute: 0.1 10*3/uL (ref 0.0–0.5)
HEMATOCRIT: 50.9 % (ref 39.0–52.0)
HEMOGLOBIN: 16.1 g/dL (ref 13.0–17.0)
Immature Granulocytes: 0 %
LYMPHS ABS: 1.7 10*3/uL (ref 0.7–4.0)
LYMPHS PCT: 38 %
MCH: 24.1 pg — ABNORMAL LOW (ref 26.0–34.0)
MCHC: 31.6 g/dL (ref 30.0–36.0)
MCV: 76.3 fL — ABNORMAL LOW (ref 80.0–100.0)
Monocytes Absolute: 0.5 10*3/uL (ref 0.1–1.0)
Monocytes Relative: 12 %
NRBC: 0 % (ref 0.0–0.2)
Neutro Abs: 2.2 10*3/uL (ref 1.7–7.7)
Neutrophils Relative %: 49 %
Platelet Count: 171 10*3/uL (ref 150–400)
RBC: 6.67 MIL/uL — ABNORMAL HIGH (ref 4.22–5.81)
RDW: 16.8 % — ABNORMAL HIGH (ref 11.5–15.5)
WBC Count: 4.4 10*3/uL (ref 4.0–10.5)

## 2018-06-02 NOTE — Patient Instructions (Signed)

## 2018-06-02 NOTE — Progress Notes (Signed)
Harrison Zetina presents today for phlebotomy per MD orders. Phlebotomy procedure started at 1109 with 16G in left AC and ended at 1122. 518 grams removed. Patient declined to stay for 30 minute post-observation, VSS at time of discharge. Drink provided Patient tolerated procedure well. IV needle removed intact.

## 2018-07-13 ENCOUNTER — Telehealth: Payer: Self-pay | Admitting: Hematology and Oncology

## 2018-07-13 ENCOUNTER — Telehealth: Payer: Self-pay

## 2018-07-13 ENCOUNTER — Other Ambulatory Visit: Payer: Self-pay

## 2018-07-13 DIAGNOSIS — D751 Secondary polycythemia: Secondary | ICD-10-CM

## 2018-07-13 NOTE — Telephone Encounter (Signed)
Called and left a message with Mr. Cory [name] to see if he could come in at a different time for his phlebotomy.  Awaiting a return call. Gardiner Rhyme

## 2018-07-13 NOTE — Telephone Encounter (Signed)
R/s appt per 1/13 sch emssage - pt is aware of appt date and time

## 2018-07-14 ENCOUNTER — Inpatient Hospital Stay: Payer: Medicare Other

## 2018-07-20 ENCOUNTER — Inpatient Hospital Stay: Payer: Medicare Other | Attending: Hematology and Oncology

## 2018-07-20 ENCOUNTER — Inpatient Hospital Stay: Payer: Medicare Other

## 2018-07-20 VITALS — BP 134/85 | HR 70 | Temp 97.7°F | Resp 16

## 2018-07-20 DIAGNOSIS — D571 Sickle-cell disease without crisis: Secondary | ICD-10-CM | POA: Insufficient documentation

## 2018-07-20 DIAGNOSIS — I10 Essential (primary) hypertension: Secondary | ICD-10-CM | POA: Insufficient documentation

## 2018-07-20 DIAGNOSIS — E7800 Pure hypercholesterolemia, unspecified: Secondary | ICD-10-CM | POA: Diagnosis not present

## 2018-07-20 DIAGNOSIS — D751 Secondary polycythemia: Secondary | ICD-10-CM

## 2018-07-20 DIAGNOSIS — S5001XA Contusion of right elbow, initial encounter: Secondary | ICD-10-CM | POA: Diagnosis not present

## 2018-07-20 LAB — CBC WITH DIFFERENTIAL (CANCER CENTER ONLY)
ABS IMMATURE GRANULOCYTES: 0.01 10*3/uL (ref 0.00–0.07)
BASOS ABS: 0 10*3/uL (ref 0.0–0.1)
Basophils Relative: 0 %
Eosinophils Absolute: 0.1 10*3/uL (ref 0.0–0.5)
Eosinophils Relative: 2 %
HEMATOCRIT: 49.4 % (ref 39.0–52.0)
HEMOGLOBIN: 15.2 g/dL (ref 13.0–17.0)
Immature Granulocytes: 0 %
LYMPHS ABS: 1.7 10*3/uL (ref 0.7–4.0)
LYMPHS PCT: 40 %
MCH: 23.6 pg — ABNORMAL LOW (ref 26.0–34.0)
MCHC: 30.8 g/dL (ref 30.0–36.0)
MCV: 76.6 fL — ABNORMAL LOW (ref 80.0–100.0)
Monocytes Absolute: 0.4 10*3/uL (ref 0.1–1.0)
Monocytes Relative: 10 %
NEUTROS PCT: 48 %
NRBC: 0 % (ref 0.0–0.2)
Neutro Abs: 2 10*3/uL (ref 1.7–7.7)
Platelet Count: 160 10*3/uL (ref 150–400)
RBC: 6.45 MIL/uL — AB (ref 4.22–5.81)
RDW: 16.9 % — ABNORMAL HIGH (ref 11.5–15.5)
WBC: 4.2 10*3/uL (ref 4.0–10.5)

## 2018-07-20 NOTE — Progress Notes (Signed)
HCt 49 today.  Per office note, perform phlebotomy if greater than 48.  Phlebotomy performed via left AC.  500gm removed over approximately 10 minutes using phlebotomy kit.  Pt tolerated well.  Drink provided.  Pt observed post procedure.  VSS.

## 2018-07-20 NOTE — Patient Instructions (Signed)

## 2018-08-14 DIAGNOSIS — R309 Painful micturition, unspecified: Secondary | ICD-10-CM | POA: Diagnosis not present

## 2018-08-14 DIAGNOSIS — R319 Hematuria, unspecified: Secondary | ICD-10-CM | POA: Diagnosis not present

## 2018-08-25 ENCOUNTER — Inpatient Hospital Stay: Payer: Medicare Other | Attending: Hematology and Oncology

## 2018-08-25 ENCOUNTER — Inpatient Hospital Stay: Payer: Medicare Other

## 2018-08-25 ENCOUNTER — Telehealth: Payer: Self-pay | Admitting: Hematology and Oncology

## 2018-08-25 ENCOUNTER — Other Ambulatory Visit: Payer: Self-pay | Admitting: Hematology and Oncology

## 2018-08-25 DIAGNOSIS — D751 Secondary polycythemia: Secondary | ICD-10-CM

## 2018-08-25 LAB — CBC WITH DIFFERENTIAL/PLATELET
ABS IMMATURE GRANULOCYTES: 0.02 10*3/uL (ref 0.00–0.07)
BASOS PCT: 0 %
Basophils Absolute: 0 10*3/uL (ref 0.0–0.1)
Eosinophils Absolute: 0.1 10*3/uL (ref 0.0–0.5)
Eosinophils Relative: 1 %
HEMATOCRIT: 50 % (ref 39.0–52.0)
HEMOGLOBIN: 15.6 g/dL (ref 13.0–17.0)
IMMATURE GRANULOCYTES: 0 %
Lymphocytes Relative: 25 %
Lymphs Abs: 1.8 10*3/uL (ref 0.7–4.0)
MCH: 23.5 pg — AB (ref 26.0–34.0)
MCHC: 31.2 g/dL (ref 30.0–36.0)
MCV: 75.3 fL — ABNORMAL LOW (ref 80.0–100.0)
MONOS PCT: 8 %
Monocytes Absolute: 0.5 10*3/uL (ref 0.1–1.0)
NEUTROS ABS: 4.5 10*3/uL (ref 1.7–7.7)
NEUTROS PCT: 66 %
Platelets: 192 10*3/uL (ref 150–400)
RBC: 6.64 MIL/uL — ABNORMAL HIGH (ref 4.22–5.81)
RDW: 17.2 % — ABNORMAL HIGH (ref 11.5–15.5)
WBC: 6.9 10*3/uL (ref 4.0–10.5)
nRBC: 0 % (ref 0.0–0.2)

## 2018-08-25 NOTE — Telephone Encounter (Signed)
Per Hassan Rowan add for tomorrow patient had to work today

## 2018-08-26 ENCOUNTER — Inpatient Hospital Stay: Payer: Medicare Other

## 2018-08-26 DIAGNOSIS — D751 Secondary polycythemia: Secondary | ICD-10-CM | POA: Diagnosis not present

## 2018-08-26 NOTE — Patient Instructions (Signed)

## 2018-08-26 NOTE — Progress Notes (Signed)
Grason Brailsford presents today for phlebotomy per MD orders. Phlebotomy procedure started at 1040 and ended at 1100. 500 grams removed. 18 Gauge PIV needle inserted to [place] and removed intact. Patient tolerated procedure well. Patient refused to be observed for 30 minutes stating he felt fine and needed to leave to attend to other business, thus, he was observed for only 15 minutes after procedure without any incident. AVS and schedule provided to patient. He knows to call with any questions or concerns.

## 2018-09-28 ENCOUNTER — Telehealth: Payer: Self-pay

## 2018-09-28 NOTE — Telephone Encounter (Signed)
-----   Message from Heath Lark, MD sent at 09/28/2018  2:36 PM EDT ----- Regarding: reschedule to May Call him; if he is OK, delay his appt for labs, see me and plebotomy to middle of May

## 2018-09-28 NOTE — Telephone Encounter (Signed)
Called and left below message. Ask him to call the office back. ?

## 2018-09-29 ENCOUNTER — Telehealth: Payer: Self-pay

## 2018-09-29 NOTE — Telephone Encounter (Signed)
He called back and said he is ok with moving his appts out to May.  I have sent scheduling msg.

## 2018-09-29 NOTE — Telephone Encounter (Signed)
appt's moved to 4/9

## 2018-10-01 ENCOUNTER — Telehealth: Payer: Self-pay | Admitting: Hematology and Oncology

## 2018-10-01 NOTE — Telephone Encounter (Signed)
R/s appt per 3/30 sch message - pt aware of new appt date and time

## 2018-10-08 ENCOUNTER — Other Ambulatory Visit: Payer: Medicare Other

## 2018-10-08 ENCOUNTER — Ambulatory Visit: Payer: Medicare Other | Admitting: Hematology and Oncology

## 2018-10-24 DIAGNOSIS — D1722 Benign lipomatous neoplasm of skin and subcutaneous tissue of left arm: Secondary | ICD-10-CM | POA: Diagnosis not present

## 2018-11-05 DIAGNOSIS — D179 Benign lipomatous neoplasm, unspecified: Secondary | ICD-10-CM | POA: Diagnosis not present

## 2018-11-19 ENCOUNTER — Inpatient Hospital Stay: Payer: Medicare Other

## 2018-11-19 ENCOUNTER — Inpatient Hospital Stay (HOSPITAL_BASED_OUTPATIENT_CLINIC_OR_DEPARTMENT_OTHER): Payer: Medicare Other | Admitting: Hematology and Oncology

## 2018-11-19 ENCOUNTER — Inpatient Hospital Stay: Payer: Medicare Other | Attending: Hematology and Oncology

## 2018-11-19 ENCOUNTER — Other Ambulatory Visit: Payer: Self-pay

## 2018-11-19 DIAGNOSIS — D751 Secondary polycythemia: Secondary | ICD-10-CM | POA: Insufficient documentation

## 2018-11-19 DIAGNOSIS — Z79899 Other long term (current) drug therapy: Secondary | ICD-10-CM

## 2018-11-19 LAB — CBC WITH DIFFERENTIAL/PLATELET
Abs Immature Granulocytes: 0.02 10*3/uL (ref 0.00–0.07)
Basophils Absolute: 0 10*3/uL (ref 0.0–0.1)
Basophils Relative: 0 %
Eosinophils Absolute: 0.1 10*3/uL (ref 0.0–0.5)
Eosinophils Relative: 2 %
HCT: 50.9 % (ref 39.0–52.0)
Hemoglobin: 15.5 g/dL (ref 13.0–17.0)
Immature Granulocytes: 0 %
Lymphocytes Relative: 41 %
Lymphs Abs: 2.1 10*3/uL (ref 0.7–4.0)
MCH: 23 pg — ABNORMAL LOW (ref 26.0–34.0)
MCHC: 30.5 g/dL (ref 30.0–36.0)
MCV: 75.5 fL — ABNORMAL LOW (ref 80.0–100.0)
Monocytes Absolute: 0.6 10*3/uL (ref 0.1–1.0)
Monocytes Relative: 11 %
Neutro Abs: 2.3 10*3/uL (ref 1.7–7.7)
Neutrophils Relative %: 46 %
Platelets: 192 10*3/uL (ref 150–400)
RBC: 6.74 MIL/uL — ABNORMAL HIGH (ref 4.22–5.81)
RDW: 19.2 % — ABNORMAL HIGH (ref 11.5–15.5)
WBC: 5 10*3/uL (ref 4.0–10.5)
nRBC: 0 % (ref 0.0–0.2)

## 2018-11-19 NOTE — Patient Instructions (Signed)

## 2018-11-19 NOTE — Progress Notes (Signed)
Cory [name] presents today for phlebotomy per MD orders. Phlebotomy procedure started at 1309 and ended at 1318. Phlebotomy kit used at left AC. 513 grams removed. Patient tolerated procedure well and enjoyed a juice and snack. IV needle removed intact. Patient observed for 30 minutes post procedure.  

## 2018-11-20 ENCOUNTER — Telehealth: Payer: Self-pay | Admitting: Hematology and Oncology

## 2018-11-20 ENCOUNTER — Encounter: Payer: Self-pay | Admitting: Hematology and Oncology

## 2018-11-20 NOTE — Assessment & Plan Note (Signed)
His hematocrit is getting stable with spacing out phlebotomy sessions I recommend phlebotomy every other month and I plan to see him again in 12 months for further follow-up.  He agreed He will continue aspirin to prevent risk of thrombosis

## 2018-11-20 NOTE — Progress Notes (Signed)
[place] OFFICE PROGRESS NOTE  Patient Care Team: [name], MD as PCP - General (Family Medicine) Heath Lark, MD as Consulting Physician (Hematology and Oncology)  ASSESSMENT & PLAN:  Polycythemia His hematocrit is getting stable with spacing out phlebotomy sessions I recommend phlebotomy every other month and I plan to see him again in 12 months for further follow-up.  He agreed He will continue aspirin to prevent risk of thrombosis   No orders of the defined types were placed in this encounter.   INTERVAL HISTORY: Please see below for problem oriented charting. He returns for further follow-up He tolerated phlebotomy well No recent diagnosis of thrombosis No recent bleeding  SUMMARY OF ONCOLOGIC HISTORY:  This is a pleasant gentleman who was found to have abnormal CBC with polycythemia In 2014 he had a bone marrow aspirate and biopsy that came back nondiagnostic for myeloproliferative disorder. JAK 2 mutation testing was negative. The patient had intermittent phlebotomy sessions and remain on aspirin   REVIEW OF SYSTEMS:   Constitutional: Denies fevers, chills or abnormal weight loss Eyes: Denies blurriness of vision Ears, nose, mouth, throat, and face: Denies mucositis or sore throat Respiratory: Denies cough, dyspnea or wheezes Cardiovascular: Denies palpitation, chest discomfort or lower extremity swelling Gastrointestinal:  Denies nausea, heartburn or change in bowel habits Skin: Denies abnormal skin rashes Lymphatics: Denies new lymphadenopathy or easy bruising Neurological:Denies numbness, tingling or new weaknesses Behavioral/Psych: Mood is stable, no new changes  All other systems were reviewed with the patient and are negative.  I have reviewed the past medical history, past surgical history, social history and family history with the patient and they are unchanged from previous note.  ALLERGIES:  has No Known Allergies.  MEDICATIONS:   Current Outpatient Medications  Medication Sig Dispense Refill  . atorvastatin (LIPITOR) 40 MG tablet Take 40 mg by mouth daily.    Marland Kitchen aspirin EC 81 MG tablet Take 81 mg by mouth daily.    . benazepril-hydrochlorthiazide (LOTENSIN HCT) 20-12.5 MG per tablet Take 1 tablet by mouth every morning.    Marland Kitchen oxybutynin (DITROPAN-XL) 10 MG 24 hr tablet Take 10 mg by mouth daily.     No current facility-administered medications for this visit.     PHYSICAL EXAMINATION: ECOG PERFORMANCE STATUS: 0 - Asymptomatic  Vitals:   11/19/18 1140  BP: 138/76  Pulse: 65  Resp: 18  Temp: 98.5 F (36.9 C)  SpO2: 100%   Filed Weights   11/19/18 1140  Weight: 191 lb 3.2 oz (86.7 kg)    GENERAL:alert, no distress and comfortable NEURO: alert & oriented x 3 with fluent speech, no focal motor/sensory deficits  LABORATORY DATA:  I have reviewed the data as listed    Component Value Date/Time   NA 141 08/05/2017 0906   NA 144 03/03/2013 0944   K 3.8 08/05/2017 0906   K 3.6 03/03/2013 0944   CL 107 08/05/2017 0906   CL 106 08/04/2012 0957   CO2 21 (L) 08/05/2017 0906   CO2 26 03/03/2013 0944   GLUCOSE 111 (H) 08/05/2017 0906   GLUCOSE 132 03/03/2013 0944   GLUCOSE 108 (H) 08/04/2012 0957   BUN 16 08/05/2017 0906   BUN 19.6 03/03/2013 0944   CREATININE 1.17 08/05/2017 0906   CREATININE 1.2 03/03/2013 0944   CALCIUM 9.0 08/05/2017 0906   CALCIUM 9.3 03/03/2013 0944   PROT 6.9 08/05/2017 0906   PROT 6.9 03/03/2013 0944   ALBUMIN 4.0 08/05/2017 0906   ALBUMIN 3.9 03/03/2013  0944   AST 24 08/05/2017 0906   AST 20 03/03/2013 0944   ALT 25 08/05/2017 0906   ALT 27 03/03/2013 0944   ALKPHOS 74 08/05/2017 0906   ALKPHOS 69 03/03/2013 0944   BILITOT 1.3 (H) 08/05/2017 0906   BILITOT 1.29 (H) 03/03/2013 0944   GFRNONAA 57 (L) 08/05/2017 0906   GFRAA >60 08/05/2017 0906    No results found for: SPEP, UPEP  Lab Results  Component Value Date   WBC 5.0 11/19/2018   NEUTROABS 2.3 11/19/2018    HGB 15.5 11/19/2018   HCT 50.9 11/19/2018   MCV 75.5 (L) 11/19/2018   PLT 192 11/19/2018      Chemistry      Component Value Date/Time   NA 141 08/05/2017 0906   NA 144 03/03/2013 0944   K 3.8 08/05/2017 0906   K 3.6 03/03/2013 0944   CL 107 08/05/2017 0906   CL 106 08/04/2012 0957   CO2 21 (L) 08/05/2017 0906   CO2 26 03/03/2013 0944   BUN 16 08/05/2017 0906   BUN 19.6 03/03/2013 0944   CREATININE 1.17 08/05/2017 0906   CREATININE 1.2 03/03/2013 0944      Component Value Date/Time   CALCIUM 9.0 08/05/2017 0906   CALCIUM 9.3 03/03/2013 0944   ALKPHOS 74 08/05/2017 0906   ALKPHOS 69 03/03/2013 0944   AST 24 08/05/2017 0906   AST 20 03/03/2013 0944   ALT 25 08/05/2017 0906   ALT 27 03/03/2013 0944   BILITOT 1.3 (H) 08/05/2017 0906   BILITOT 1.29 (H) 03/03/2013 0944       All questions were answered. The patient knows to call the clinic with any problems, questions or concerns. No barriers to learning was detected.  I spent 10 minutes counseling the patient face to face. The total time spent in the appointment was 15 minutes and more than 50% was on counseling and review of test results  Heath Lark, MD 11/20/2018 10:42 AM

## 2018-11-20 NOTE — Telephone Encounter (Signed)
Tried to reach regarding schedule °

## 2018-12-21 DIAGNOSIS — H2513 Age-related nuclear cataract, bilateral: Secondary | ICD-10-CM | POA: Diagnosis not present

## 2018-12-21 DIAGNOSIS — H40013 Open angle with borderline findings, low risk, bilateral: Secondary | ICD-10-CM | POA: Diagnosis not present

## 2018-12-21 DIAGNOSIS — H11153 Pinguecula, bilateral: Secondary | ICD-10-CM | POA: Diagnosis not present

## 2018-12-21 DIAGNOSIS — H524 Presbyopia: Secondary | ICD-10-CM | POA: Diagnosis not present

## 2018-12-24 DIAGNOSIS — R2230 Localized swelling, mass and lump, unspecified upper limb: Secondary | ICD-10-CM | POA: Diagnosis not present

## 2018-12-25 ENCOUNTER — Other Ambulatory Visit: Payer: Self-pay | Admitting: Family Medicine

## 2018-12-25 DIAGNOSIS — R2232 Localized swelling, mass and lump, left upper limb: Secondary | ICD-10-CM

## 2018-12-25 DIAGNOSIS — Z20828 Contact with and (suspected) exposure to other viral communicable diseases: Secondary | ICD-10-CM | POA: Diagnosis not present

## 2019-01-06 ENCOUNTER — Ambulatory Visit
Admission: RE | Admit: 2019-01-06 | Discharge: 2019-01-06 | Disposition: A | Discharge status: Home / Self Care | Payer: Medicare Other | Source: Ambulatory Visit | Attending: Family Medicine | Admitting: Family Medicine

## 2019-01-06 DIAGNOSIS — R229 Localized swelling, mass and lump, unspecified: Secondary | ICD-10-CM | POA: Diagnosis not present

## 2019-01-06 DIAGNOSIS — R2232 Localized swelling, mass and lump, left upper limb: Secondary | ICD-10-CM

## 2019-01-19 ENCOUNTER — Inpatient Hospital Stay: Payer: Medicare Other

## 2019-01-19 ENCOUNTER — Other Ambulatory Visit: Payer: Self-pay

## 2019-01-19 ENCOUNTER — Inpatient Hospital Stay: Payer: Medicare Other | Attending: Hematology and Oncology

## 2019-01-19 DIAGNOSIS — D751 Secondary polycythemia: Secondary | ICD-10-CM | POA: Diagnosis not present

## 2019-01-19 LAB — CBC WITH DIFFERENTIAL/PLATELET
Abs Immature Granulocytes: 0.01 10*3/uL (ref 0.00–0.07)
Basophils Absolute: 0 10*3/uL (ref 0.0–0.1)
Basophils Relative: 0 %
Eosinophils Absolute: 0.1 10*3/uL (ref 0.0–0.5)
Eosinophils Relative: 2 %
HCT: 48.2 % (ref 39.0–52.0)
Hemoglobin: 14.8 g/dL (ref 13.0–17.0)
Immature Granulocytes: 0 %
Lymphocytes Relative: 39 %
Lymphs Abs: 1.6 10*3/uL (ref 0.7–4.0)
MCH: 22.7 pg — ABNORMAL LOW (ref 26.0–34.0)
MCHC: 30.7 g/dL (ref 30.0–36.0)
MCV: 74 fL — ABNORMAL LOW (ref 80.0–100.0)
Monocytes Absolute: 0.5 10*3/uL (ref 0.1–1.0)
Monocytes Relative: 13 %
Neutro Abs: 1.8 10*3/uL (ref 1.7–7.7)
Neutrophils Relative %: 46 %
Platelets: 177 10*3/uL (ref 150–400)
RBC: 6.51 MIL/uL — ABNORMAL HIGH (ref 4.22–5.81)
RDW: 18.8 % — ABNORMAL HIGH (ref 11.5–15.5)
WBC: 4 10*3/uL (ref 4.0–10.5)
nRBC: 0 % (ref 0.0–0.2)

## 2019-01-19 NOTE — Progress Notes (Signed)
Per Dr. Alvy Bimler, since patient is still here, go ahead and do phlebotomy today and we will cancel his September phlebotomy appointment, if he is in agreeance with this. If he insist on leaving today then he has to return in September for procedure. Patient stated that he will skip procedure today and return in September. Printed copy of schedule/appointment provided.

## 2019-02-14 DIAGNOSIS — R040 Epistaxis: Secondary | ICD-10-CM | POA: Diagnosis not present

## 2019-02-17 DIAGNOSIS — G459 Transient cerebral ischemic attack, unspecified: Secondary | ICD-10-CM | POA: Diagnosis not present

## 2019-02-17 DIAGNOSIS — I10 Essential (primary) hypertension: Secondary | ICD-10-CM | POA: Diagnosis not present

## 2019-02-17 DIAGNOSIS — E7800 Pure hypercholesterolemia, unspecified: Secondary | ICD-10-CM | POA: Diagnosis not present

## 2019-02-17 DIAGNOSIS — Z1389 Encounter for screening for other disorder: Secondary | ICD-10-CM | POA: Diagnosis not present

## 2019-02-17 DIAGNOSIS — D751 Secondary polycythemia: Secondary | ICD-10-CM | POA: Diagnosis not present

## 2019-02-17 DIAGNOSIS — R040 Epistaxis: Secondary | ICD-10-CM | POA: Diagnosis not present

## 2019-02-17 DIAGNOSIS — Z0000 Encounter for general adult medical examination without abnormal findings: Secondary | ICD-10-CM | POA: Diagnosis not present

## 2019-02-17 DIAGNOSIS — C61 Malignant neoplasm of prostate: Secondary | ICD-10-CM | POA: Diagnosis not present

## 2019-02-17 DIAGNOSIS — K219 Gastro-esophageal reflux disease without esophagitis: Secondary | ICD-10-CM | POA: Diagnosis not present

## 2019-02-17 DIAGNOSIS — N3941 Urge incontinence: Secondary | ICD-10-CM | POA: Diagnosis not present

## 2019-02-17 DIAGNOSIS — N5201 Erectile dysfunction due to arterial insufficiency: Secondary | ICD-10-CM | POA: Diagnosis not present

## 2019-02-17 DIAGNOSIS — R7303 Prediabetes: Secondary | ICD-10-CM | POA: Diagnosis not present

## 2019-03-07 DIAGNOSIS — Z23 Encounter for immunization: Secondary | ICD-10-CM | POA: Diagnosis not present

## 2019-03-09 ENCOUNTER — Ambulatory Visit: Payer: Self-pay | Admitting: Surgery

## 2019-03-09 DIAGNOSIS — D171 Benign lipomatous neoplasm of skin and subcutaneous tissue of trunk: Secondary | ICD-10-CM | POA: Diagnosis not present

## 2019-03-09 NOTE — H&P (Signed)
History of Present Illness Cory [name]. Cory Bracher MD; 03/09/2019 4:46 PM) The patient is a 83 year old male who presents with a complaint of Mass. This patient is a fairly healthy 83 year old male who is status post laparoscopic cholecystectomy with cholangiogram in 2019 who presents with a three-month history of a slowly enlarging mass in the left axilla. This is located anteriorly near the head of the biceps. This causes some discomfort. There has been no sign of infection. The mass seems to be slowly enlarging. He underwent ultrasound of this area which showed what seems to be a lipoma measuring 4.6 cm in greatest dimension. He presents now for discussion for excision.  CLINICAL DATA: Palpable, occasionally tender nodule anteriorly in the left axilla for 2 months.  EXAM: ULTRASOUND LEFT UPPER EXTREMITY LIMITED  TECHNIQUE: Ultrasound examination of the upper extremity soft tissues was performed in the area of clinical concern.  COMPARISON: None.  FINDINGS: Corresponding with the patient's palpable concern is a well-circumscribed isoechoic nodule anteriorly in the left axilla which measures approximately 4.6 x 1.9 x 3.8 cm. Internal echogenicity is minimally heterogeneous without shadowing. There is low-level internal blood flow. No other masses are identified.  IMPRESSION: The palpable concern anteriorly in the left axilla appears solid and may reflect a lipoma. The appearance is nonspecific, and based on location, adenopathy cannot be excluded. Consider noncontrast chest CT to better differentiate.   Electronically Signed By: Richardean Sale M.D. On: 01/06/2019 15:55    Problem List/Past Medical Cory Key K. Sharlynn Seckinger, MD; 03/09/2019 4:46 PM) CHRONIC CHOLECYSTITIS WITH CALCULUS (K80.10) LIPOMA OF SKIN AND SUBCUTANEOUS TISSUE OF EXTREMITY (D17.39)  Past Surgical History (Cory [name] K. Emilian Stawicki, MD; 03/09/2019 4:46 PM) Knee Surgery Right. Prostate Surgery - Removal Spinal Surgery  - Lower Back  Diagnostic Studies History Cory [name]. Cory Foutz, MD; 03/09/2019 4:46 PM) Colonoscopy 5-10 years ago  Allergies Cory [name], Cory [name]; 03/09/2019 3:51 PM) No Known Drug Allergies [07/23/2017]: Allergies Reconciled  Medication History Cory [name], CMA; 03/09/2019 3:51 PM) Oxybutynin Chloride ER (10MG  Tablet ER 24HR, Oral) Active. Lotensin (Oral) Specific strength unknown - Active. hydroCHLOROthiazide (12.5MG  Capsule, Oral) Active. Atorvastatin Calcium (40MG  Tablet, Oral) Active. Medications Reconciled  Social History Cory [name]. Cory Blyth, MD; 03/09/2019 4:46 PM) Alcohol use Occasional alcohol use. Caffeine use Carbonated beverages, Tea. No drug use Tobacco use Former smoker.  Family History Cory [name]. Cory Dibenedetto, MD; 03/09/2019 4:46 PM) Hypertension Mother.  Other Problems Cory [name]. Cory Renfro, MD; 03/09/2019 4:46 PM) Back Pain Cholelithiasis Gastroesophageal Reflux Disease High blood pressure Hypercholesterolemia Kidney Stone Prostate Cancer    Vitals Cory [name] CMA; 03/09/2019 3:50 PM) 03/09/2019 3:50 PM Weight: 190.4 lb Height: 69in Body Surface Area: 2.02 m Body Mass Index: 28.12 kg/m  Temp.: 98.28F  Pulse: 94 (Regular)  BP: 150/76 (Sitting, Left Arm, Standard)        Physical Exam Cory Key K. Gerrell Tabet MD; 03/09/2019 4:47 PM)  The physical exam findings are as follows: Note:WDWN in NAD Eyes: Pupils equal, round; sclera anicteric HENT: Oral mucosa moist; good dentition Neck: No masses palpated, no thyromegaly Left biceps region at anterior edge of axilla - protruding 4 cm subcutaneous mass with no overlying skin changes; smooth, mobile Lungs: CTA bilaterally; normal respiratory effort CV: Regular rate and rhythm; no murmurs; extremities well-perfused with no edema Abd: +bowel sounds, soft, non-tender, no palpable organomegaly; no palpable hernias Skin: Warm, dry; no sign of jaundice Psychiatric - alert and oriented x 4; calm  mood and affect    Assessment & Plan Cory Key K. Mainor Hellmann MD; 03/09/2019 4:10 PM)  LIPOMA OF SKIN AND SUBCUTANEOUS TISSUE OF EXTREMITY (D17.39) Impression: Left anterior axilla 4.6 x 1.9 x 3.8 cm  Current Plans Schedule for Surgery - Excision of subcutaneous lipoma - left axilla. The surgical procedure has been discussed with the patient. Potential risks, benefits, alternative treatments, and expected outcomes have been explained. All of the patient's questions at this time have been answered. The likelihood of reaching the patient's treatment goal is good. The patient understand the proposed surgical procedure and wishes to proceed.  Cory [name]. Georgette Dover, MD, [facility] Surgery  General/ Trauma Surgery Beeper [phone]  03/09/2019 4:48 PM

## 2019-03-22 ENCOUNTER — Inpatient Hospital Stay: Payer: Medicare Other | Attending: Hematology and Oncology

## 2019-03-22 ENCOUNTER — Inpatient Hospital Stay: Payer: Medicare Other

## 2019-03-22 ENCOUNTER — Other Ambulatory Visit: Payer: Self-pay

## 2019-03-22 VITALS — BP 125/71 | HR 74 | Temp 98.2°F | Resp 16

## 2019-03-22 DIAGNOSIS — D751 Secondary polycythemia: Secondary | ICD-10-CM | POA: Diagnosis present

## 2019-03-22 DIAGNOSIS — D571 Sickle-cell disease without crisis: Secondary | ICD-10-CM | POA: Diagnosis not present

## 2019-03-22 LAB — CBC WITH DIFFERENTIAL/PLATELET
Abs Immature Granulocytes: 0.01 10*3/uL (ref 0.00–0.07)
Basophils Absolute: 0 10*3/uL (ref 0.0–0.1)
Basophils Relative: 0 %
Eosinophils Absolute: 0.1 10*3/uL (ref 0.0–0.5)
Eosinophils Relative: 2 %
HCT: 49.5 % (ref 39.0–52.0)
Hemoglobin: 15.5 g/dL (ref 13.0–17.0)
Immature Granulocytes: 0 %
Lymphocytes Relative: 37 %
Lymphs Abs: 1.7 10*3/uL (ref 0.7–4.0)
MCH: 23.5 pg — ABNORMAL LOW (ref 26.0–34.0)
MCHC: 31.3 g/dL (ref 30.0–36.0)
MCV: 75 fL — ABNORMAL LOW (ref 80.0–100.0)
Monocytes Absolute: 0.5 10*3/uL (ref 0.1–1.0)
Monocytes Relative: 11 %
Neutro Abs: 2.3 10*3/uL (ref 1.7–7.7)
Neutrophils Relative %: 50 %
Platelets: 175 10*3/uL (ref 150–400)
RBC: 6.6 MIL/uL — ABNORMAL HIGH (ref 4.22–5.81)
RDW: 19.4 % — ABNORMAL HIGH (ref 11.5–15.5)
WBC: 4.6 10*3/uL (ref 4.0–10.5)
nRBC: 0 % (ref 0.0–0.2)

## 2019-03-22 NOTE — Patient Instructions (Signed)
Therapeutic Phlebotomy Therapeutic phlebotomy is the planned removal of blood from a person's body for the purpose of treating a medical condition. The procedure is similar to donating blood. Usually, about a pint (470 mL, or 0.47 L) of blood is removed. The average adult has 9-12 pints (4.3-5.7 L) of blood in the body. Therapeutic phlebotomy may be used to treat the following medical conditions:  Hemochromatosis. This is a condition in which the blood contains too much iron.  Polycythemia vera. This is a condition in which the blood contains too many red blood cells.  Porphyria cutanea tarda. This is a disease in which an important part of hemoglobin is not made properly. It results in the buildup of abnormal amounts of porphyrins in the body.  Sickle cell disease. This is a condition in which the red blood cells form an abnormal crescent shape rather than a round shape. Tell a health care provider about:  Any allergies you have.  All medicines you are taking, including vitamins, herbs, eye drops, creams, and over-the-counter medicines.  Any problems you or family members have had with anesthetic medicines.  Any blood disorders you have.  Any surgeries you have had.  Any medical conditions you have.  Whether you are pregnant or may be pregnant. What are the risks? Generally, this is a safe procedure. However, problems may occur, including:  Nausea or light-headedness.  Low blood pressure (hypotension).  Soreness, bleeding, swelling, or bruising at the needle insertion site.  Infection. What happens before the procedure?  Follow instructions from your health care provider about eating or drinking restrictions.  Ask your health care provider about: ? Changing or stopping your regular medicines. This is especially important if you are taking diabetes medicines or blood thinners (anticoagulants). ? Taking medicines such as aspirin and ibuprofen. These medicines can thin your  blood. Do not take these medicines unless your health care provider tells you to take them. ? Taking over-the-counter medicines, vitamins, herbs, and supplements.  Wear clothing with sleeves that can be raised above the elbow.  Plan to have someone take you home from the hospital or clinic.  You may have a blood sample taken.  Your blood pressure, pulse rate, and breathing rate will be measured. What happens during the procedure?   To lower your risk of infection: ? Your health care team will wash or sanitize their hands. ? Your skin will be cleaned with an antiseptic.  You may be given a medicine to numb the area (local anesthetic).  A tourniquet will be placed on your arm.  A needle will be inserted into one of your veins.  Tubing and a collection bag will be attached to that needle.  Blood will flow through the needle and tubing into the collection bag.  The collection bag will be placed lower than your arm to allow gravity to help the flow of blood into the bag.  You may be asked to open and close your hand slowly and continually during the entire collection.  After the specified amount of blood has been removed from your body, the collection bag and tubing will be clamped.  The needle will be removed from your vein.  Pressure will be held on the site of the needle insertion to stop the bleeding.  A bandage (dressing) will be placed over the needle insertion site. The procedure may vary among health care providers and hospitals. What happens after the procedure?  Your blood pressure, pulse rate, and breathing rate will be   measured after the procedure.  You will be encouraged to drink fluids.  Your recovery will be assessed and monitored.  You can return to your normal activities as told by your health care provider. Summary  Therapeutic phlebotomy is the planned removal of blood from a person's body for the purpose of treating a medical condition.  Therapeutic  phlebotomy may be used to treat hemochromatosis, polycythemia vera, porphyria cutanea tarda, or sickle cell disease.  In the procedure, a needle is inserted and about a pint (470 mL, or 0.47 L) of blood is removed. The average adult has 9-12 pints (4.3-5.7 L) of blood in the body.  This is generally a safe procedure, but it can sometimes cause problems such as nausea, light-headedness, or low blood pressure (hypotension). This information is not intended to replace advice given to you by your health care provider. Make sure you discuss any questions you have with your health care provider. Document Released: 11/19/2010 Document Revised: 07/03/2017 Document Reviewed: 07/03/2017 Elsevier Patient Education  2020 [employer]  

## 2019-04-02 DIAGNOSIS — Z1159 Encounter for screening for other viral diseases: Secondary | ICD-10-CM | POA: Diagnosis not present

## 2019-04-06 ENCOUNTER — Other Ambulatory Visit: Payer: Self-pay | Admitting: Surgery

## 2019-04-06 DIAGNOSIS — D171 Benign lipomatous neoplasm of skin and subcutaneous tissue of trunk: Secondary | ICD-10-CM | POA: Diagnosis not present

## 2019-05-06 DIAGNOSIS — N401 Enlarged prostate with lower urinary tract symptoms: Secondary | ICD-10-CM | POA: Diagnosis not present

## 2019-05-11 DIAGNOSIS — M25551 Pain in right hip: Secondary | ICD-10-CM | POA: Diagnosis not present

## 2019-05-11 DIAGNOSIS — I10 Essential (primary) hypertension: Secondary | ICD-10-CM | POA: Diagnosis not present

## 2019-05-12 DIAGNOSIS — R350 Frequency of micturition: Secondary | ICD-10-CM | POA: Diagnosis not present

## 2019-05-12 DIAGNOSIS — N3944 Nocturnal enuresis: Secondary | ICD-10-CM | POA: Diagnosis not present

## 2019-05-14 DIAGNOSIS — M25551 Pain in right hip: Secondary | ICD-10-CM | POA: Diagnosis not present

## 2019-05-14 DIAGNOSIS — M545 Low back pain: Secondary | ICD-10-CM | POA: Diagnosis not present

## 2019-05-19 DIAGNOSIS — M7061 Trochanteric bursitis, right hip: Secondary | ICD-10-CM | POA: Diagnosis not present

## 2019-05-21 ENCOUNTER — Inpatient Hospital Stay: Payer: Medicare Other

## 2019-05-21 ENCOUNTER — Inpatient Hospital Stay: Payer: Medicare Other | Attending: Hematology and Oncology

## 2019-05-21 ENCOUNTER — Other Ambulatory Visit: Payer: Self-pay

## 2019-05-21 DIAGNOSIS — D751 Secondary polycythemia: Secondary | ICD-10-CM | POA: Diagnosis not present

## 2019-05-21 LAB — CBC WITH DIFFERENTIAL/PLATELET
Abs Immature Granulocytes: 0.05 10*3/uL (ref 0.00–0.07)
Basophils Absolute: 0 10*3/uL (ref 0.0–0.1)
Basophils Relative: 0 %
Eosinophils Absolute: 0.1 10*3/uL (ref 0.0–0.5)
Eosinophils Relative: 1 %
HCT: 47.4 % (ref 39.0–52.0)
Hemoglobin: 14.9 g/dL (ref 13.0–17.0)
Immature Granulocytes: 1 %
Lymphocytes Relative: 26 %
Lymphs Abs: 1.5 10*3/uL (ref 0.7–4.0)
MCH: 24.5 pg — ABNORMAL LOW (ref 26.0–34.0)
MCHC: 31.4 g/dL (ref 30.0–36.0)
MCV: 78.1 fL — ABNORMAL LOW (ref 80.0–100.0)
Monocytes Absolute: 0.6 10*3/uL (ref 0.1–1.0)
Monocytes Relative: 10 %
Neutro Abs: 3.6 10*3/uL (ref 1.7–7.7)
Neutrophils Relative %: 62 %
Platelets: 176 10*3/uL (ref 150–400)
RBC: 6.07 MIL/uL — ABNORMAL HIGH (ref 4.22–5.81)
RDW: 18.3 % — ABNORMAL HIGH (ref 11.5–15.5)
WBC: 5.8 10*3/uL (ref 4.0–10.5)
nRBC: 0 % (ref 0.0–0.2)

## 2019-05-21 NOTE — Progress Notes (Signed)
Per Dr. Alvy Bimler, patient can skip appointment in January and come in March for next phlebotomy. Patient is in agreement.

## 2019-05-21 NOTE — Patient Instructions (Signed)
Therapeutic Phlebotomy Therapeutic phlebotomy is the planned removal of blood from a person's body for the purpose of treating a medical condition. The procedure is similar to donating blood. Usually, about a pint (470 mL, or 0.47 L) of blood is removed. The average adult has 9-12 pints (4.3-5.7 L) of blood in the body. Therapeutic phlebotomy may be used to treat the following medical conditions:  Hemochromatosis. This is a condition in which the blood contains too much iron.  Polycythemia vera. This is a condition in which the blood contains too many red blood cells.  Porphyria cutanea tarda. This is a disease in which an important part of hemoglobin is not made properly. It results in the buildup of abnormal amounts of porphyrins in the body.  Sickle cell disease. This is a condition in which the red blood cells form an abnormal crescent shape rather than a round shape. Tell a health care provider about:  Any allergies you have.  All medicines you are taking, including vitamins, herbs, eye drops, creams, and over-the-counter medicines.  Any problems you or family members have had with anesthetic medicines.  Any blood disorders you have.  Any surgeries you have had.  Any medical conditions you have.  Whether you are pregnant or may be pregnant. What are the risks? Generally, this is a safe procedure. However, problems may occur, including:  Nausea or light-headedness.  Low blood pressure (hypotension).  Soreness, bleeding, swelling, or bruising at the needle insertion site.  Infection. What happens before the procedure?  Follow instructions from your health care provider about eating or drinking restrictions.  Ask your health care provider about: ? Changing or stopping your regular medicines. This is especially important if you are taking diabetes medicines or blood thinners (anticoagulants). ? Taking medicines such as aspirin and ibuprofen. These medicines can thin your  blood. Do not take these medicines unless your health care provider tells you to take them. ? Taking over-the-counter medicines, vitamins, herbs, and supplements.  Wear clothing with sleeves that can be raised above the elbow.  Plan to have someone take you home from the hospital or clinic.  You may have a blood sample taken.  Your blood pressure, pulse rate, and breathing rate will be measured. What happens during the procedure?   To lower your risk of infection: ? Your health care team will wash or sanitize their hands. ? Your skin will be cleaned with an antiseptic.  You may be given a medicine to numb the area (local anesthetic).  A tourniquet will be placed on your arm.  A needle will be inserted into one of your veins.  Tubing and a collection bag will be attached to that needle.  Blood will flow through the needle and tubing into the collection bag.  The collection bag will be placed lower than your arm to allow gravity to help the flow of blood into the bag.  You may be asked to open and close your hand slowly and continually during the entire collection.  After the specified amount of blood has been removed from your body, the collection bag and tubing will be clamped.  The needle will be removed from your vein.  Pressure will be held on the site of the needle insertion to stop the bleeding.  A bandage (dressing) will be placed over the needle insertion site. The procedure may vary among health care providers and hospitals. What happens after the procedure?  Your blood pressure, pulse rate, and breathing rate will be   measured after the procedure.  You will be encouraged to drink fluids.  Your recovery will be assessed and monitored.  You can return to your normal activities as told by your health care provider. Summary  Therapeutic phlebotomy is the planned removal of blood from a person's body for the purpose of treating a medical condition.  Therapeutic  phlebotomy may be used to treat hemochromatosis, polycythemia vera, porphyria cutanea tarda, or sickle cell disease.  In the procedure, a needle is inserted and about a pint (470 mL, or 0.47 L) of blood is removed. The average adult has 9-12 pints (4.3-5.7 L) of blood in the body.  This is generally a safe procedure, but it can sometimes cause problems such as nausea, light-headedness, or low blood pressure (hypotension). This information is not intended to replace advice given to you by your health care provider. Make sure you discuss any questions you have with your health care provider. Document Released: 11/19/2010 Document Revised: 07/03/2017 Document Reviewed: 07/03/2017 Elsevier Patient Education  2020 [employer]  

## 2019-05-21 NOTE — Progress Notes (Signed)
Patient received phlebotomy today per Dr. Alvy Bimler orders. 72 mls removed from left AC and then clotted. Needle removed intact. 448 mls removed from Right AC, needle removed intact. Beverage provided. Patient refused 30 minute post observation. VSS,

## 2019-06-02 DIAGNOSIS — M1611 Unilateral primary osteoarthritis, right hip: Secondary | ICD-10-CM | POA: Diagnosis not present

## 2019-06-02 DIAGNOSIS — M7061 Trochanteric bursitis, right hip: Secondary | ICD-10-CM | POA: Diagnosis not present

## 2019-07-07 DIAGNOSIS — M545 Low back pain: Secondary | ICD-10-CM | POA: Diagnosis not present

## 2019-07-07 DIAGNOSIS — R2689 Other abnormalities of gait and mobility: Secondary | ICD-10-CM | POA: Diagnosis not present

## 2019-07-09 DIAGNOSIS — Z20822 Contact with and (suspected) exposure to covid-19: Secondary | ICD-10-CM | POA: Diagnosis not present

## 2019-07-21 ENCOUNTER — Other Ambulatory Visit: Payer: Medicare Other

## 2019-07-21 ENCOUNTER — Ambulatory Visit: Payer: Medicare Other

## 2019-07-21 DIAGNOSIS — R2689 Other abnormalities of gait and mobility: Secondary | ICD-10-CM | POA: Diagnosis not present

## 2019-07-21 DIAGNOSIS — M545 Low back pain: Secondary | ICD-10-CM | POA: Diagnosis not present

## 2019-08-08 DIAGNOSIS — Z20822 Contact with and (suspected) exposure to covid-19: Secondary | ICD-10-CM | POA: Diagnosis not present

## 2019-08-14 DIAGNOSIS — Z20822 Contact with and (suspected) exposure to covid-19: Secondary | ICD-10-CM | POA: Diagnosis not present

## 2019-08-15 DIAGNOSIS — Z20822 Contact with and (suspected) exposure to covid-19: Secondary | ICD-10-CM | POA: Diagnosis not present

## 2019-08-20 DIAGNOSIS — I10 Essential (primary) hypertension: Secondary | ICD-10-CM | POA: Diagnosis not present

## 2019-08-20 DIAGNOSIS — D751 Secondary polycythemia: Secondary | ICD-10-CM | POA: Diagnosis not present

## 2019-08-20 DIAGNOSIS — R7303 Prediabetes: Secondary | ICD-10-CM | POA: Diagnosis not present

## 2019-08-20 DIAGNOSIS — Z7189 Other specified counseling: Secondary | ICD-10-CM | POA: Diagnosis not present

## 2019-08-20 DIAGNOSIS — E7800 Pure hypercholesterolemia, unspecified: Secondary | ICD-10-CM | POA: Diagnosis not present

## 2019-08-20 DIAGNOSIS — K219 Gastro-esophageal reflux disease without esophagitis: Secondary | ICD-10-CM | POA: Diagnosis not present

## 2019-08-21 DIAGNOSIS — Z20822 Contact with and (suspected) exposure to covid-19: Secondary | ICD-10-CM | POA: Diagnosis not present

## 2019-09-03 DIAGNOSIS — R7303 Prediabetes: Secondary | ICD-10-CM | POA: Diagnosis not present

## 2019-09-03 DIAGNOSIS — E7800 Pure hypercholesterolemia, unspecified: Secondary | ICD-10-CM | POA: Diagnosis not present

## 2019-09-17 ENCOUNTER — Inpatient Hospital Stay: Payer: Medicare Other | Attending: Hematology and Oncology

## 2019-09-17 ENCOUNTER — Other Ambulatory Visit: Payer: Self-pay

## 2019-09-17 ENCOUNTER — Inpatient Hospital Stay: Payer: Medicare Other

## 2019-09-17 VITALS — BP 122/73 | HR 73 | Temp 98.0°F | Resp 16

## 2019-09-17 DIAGNOSIS — D751 Secondary polycythemia: Secondary | ICD-10-CM | POA: Diagnosis not present

## 2019-09-17 LAB — CBC WITH DIFFERENTIAL/PLATELET
Abs Immature Granulocytes: 0.02 10*3/uL (ref 0.00–0.07)
Basophils Absolute: 0 10*3/uL (ref 0.0–0.1)
Basophils Relative: 0 %
Eosinophils Absolute: 0 10*3/uL (ref 0.0–0.5)
Eosinophils Relative: 1 %
HCT: 52 % (ref 39.0–52.0)
Hemoglobin: 16.4 g/dL (ref 13.0–17.0)
Immature Granulocytes: 0 %
Lymphocytes Relative: 44 %
Lymphs Abs: 2 10*3/uL (ref 0.7–4.0)
MCH: 24 pg — ABNORMAL LOW (ref 26.0–34.0)
MCHC: 31.5 g/dL (ref 30.0–36.0)
MCV: 76.1 fL — ABNORMAL LOW (ref 80.0–100.0)
Monocytes Absolute: 0.5 10*3/uL (ref 0.1–1.0)
Monocytes Relative: 12 %
Neutro Abs: 2 10*3/uL (ref 1.7–7.7)
Neutrophils Relative %: 43 %
Platelets: 185 10*3/uL (ref 150–400)
RBC: 6.83 MIL/uL — ABNORMAL HIGH (ref 4.22–5.81)
RDW: 18.5 % — ABNORMAL HIGH (ref 11.5–15.5)
WBC: 4.7 10*3/uL (ref 4.0–10.5)
nRBC: 0 % (ref 0.0–0.2)

## 2019-09-17 NOTE — Patient Instructions (Signed)

## 2019-09-17 NOTE — Progress Notes (Signed)
Cory [name] presents today for phlebotomy per MD orders. Phlebotomy procedure started at 1235 and ended at 1240. 16 Gauge phlebotomy needle kit inserted into LAC, IV needle removed intact. 500 grams removed. Patient tolerated procedure well. Patient observed for 30 minutes after procedure without any incident. Food and drinks provided. Printed copy of AVS and Lab results provided. Patient knows to call with any questions or concerns.

## 2019-10-25 ENCOUNTER — Telehealth: Payer: Self-pay

## 2019-10-25 IMAGING — US US ABDOMEN LIMITED
1 series · 14 of 25 positions shown · non-contrast
Comparison: None.

CLINICAL DATA: Elevated LFTs

EXAM:
ULTRASOUND ABDOMEN LIMITED RIGHT UPPER QUADRANT

[Series 1: us abdomen limited · 0.14mm/px · 14 of 47 slices shown]
[im 1/47]
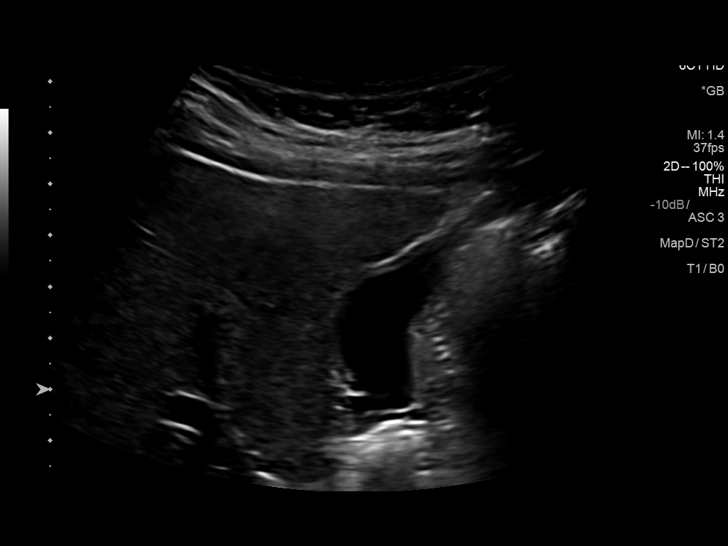
[im 4/47]
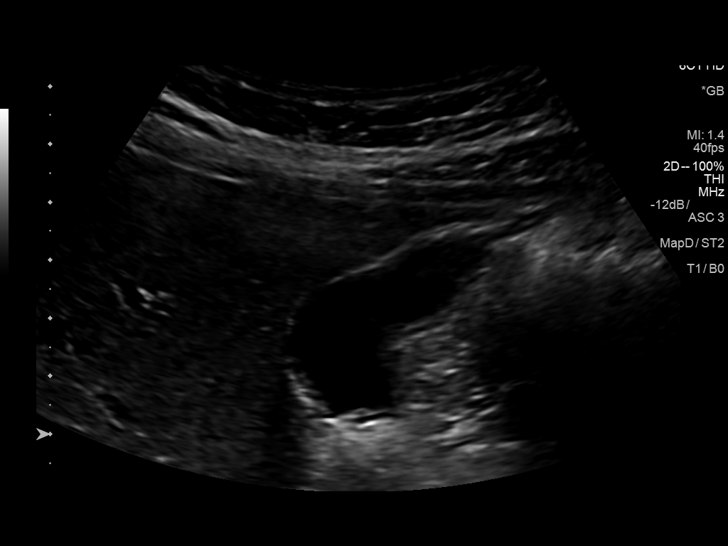
[im 8/47]
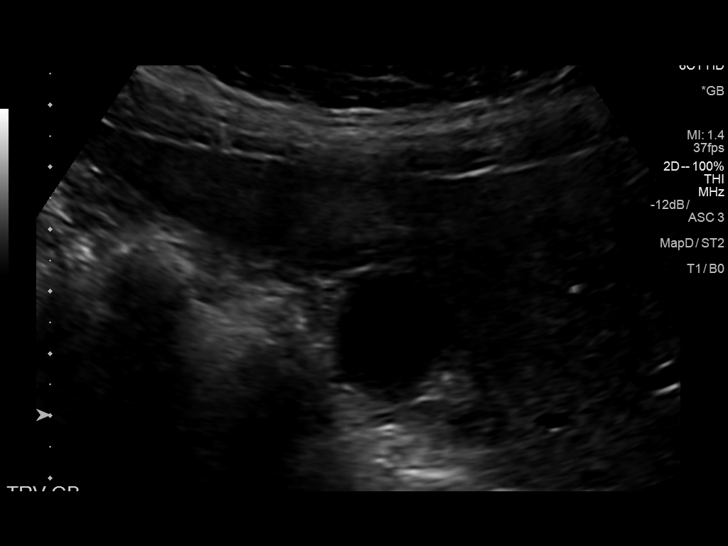
[im 12/47]
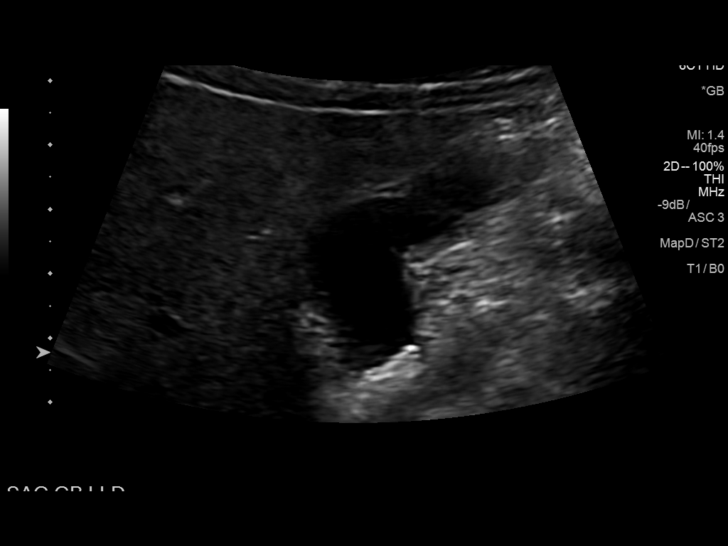
[im 16/47]
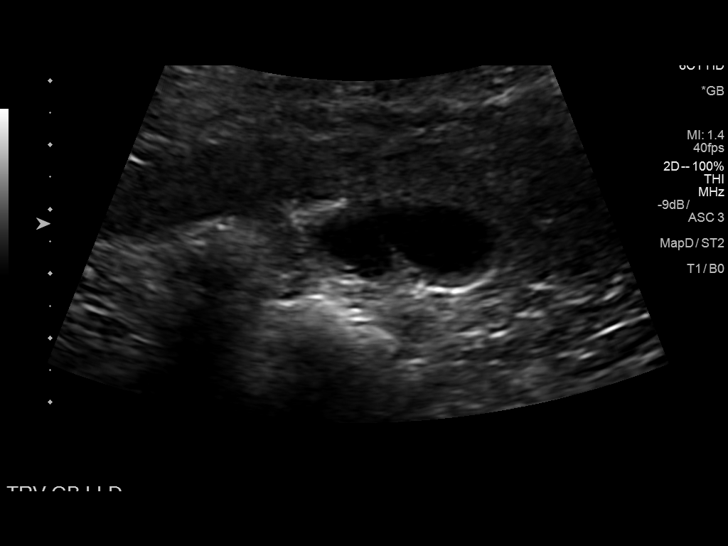
[im 18/47]
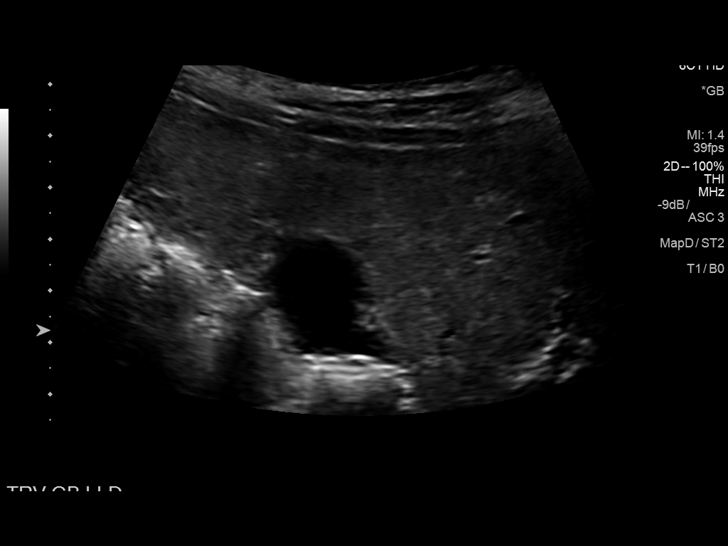
[im 22/47]
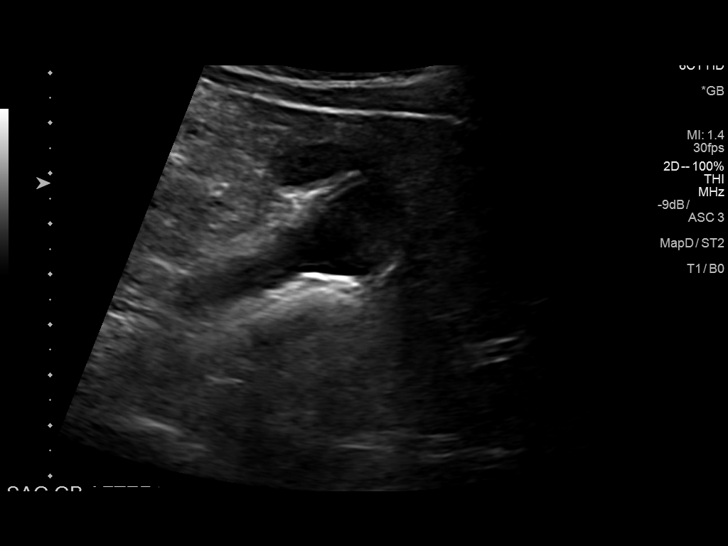
[im 25/47]
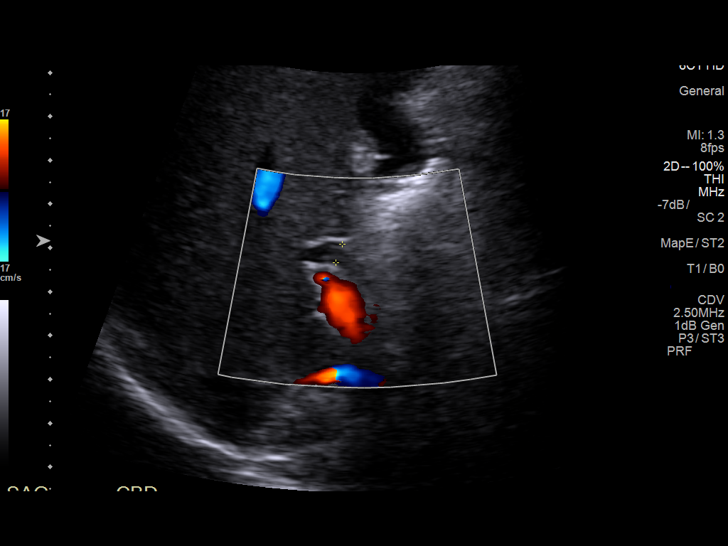
[im 29/47]
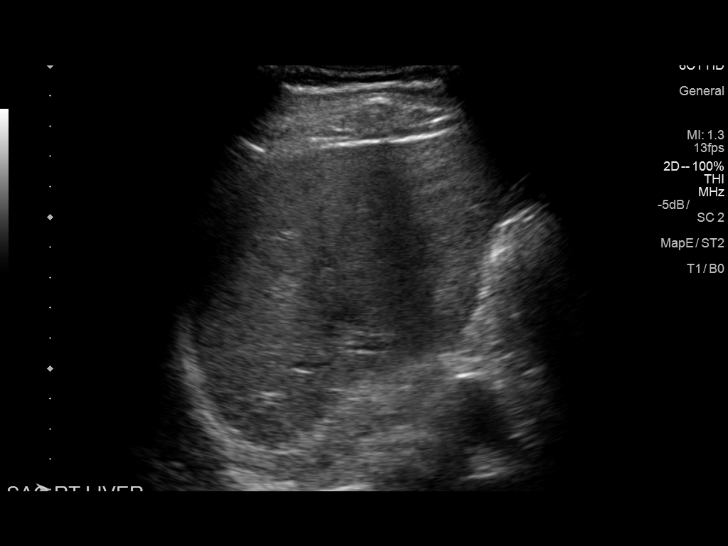
[im 31/47]
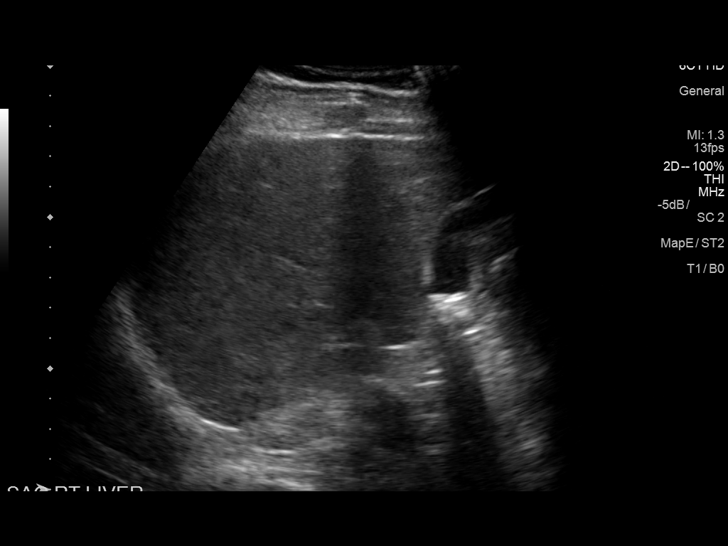
[im 35/47]
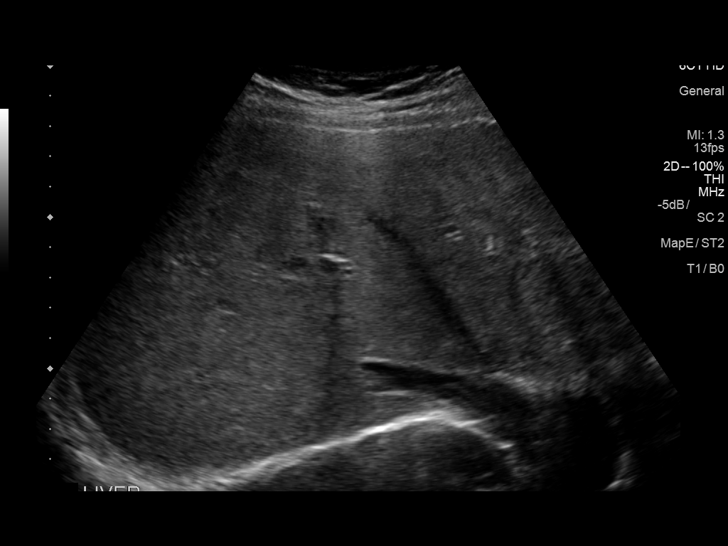
[im 39/47]
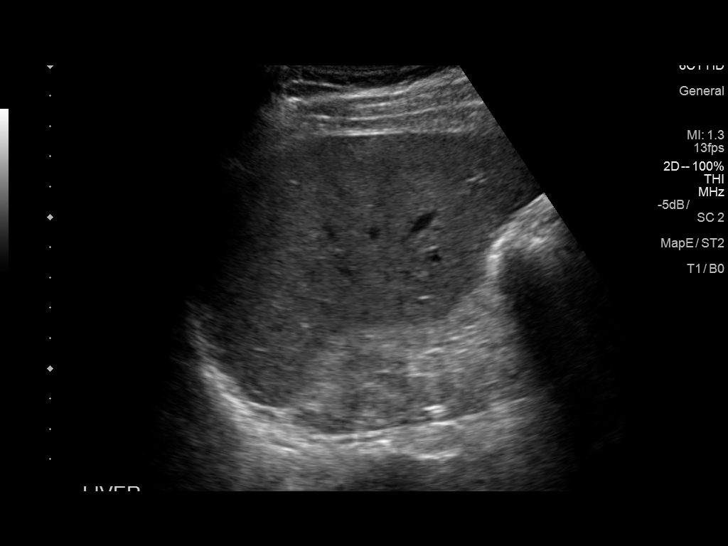
[im 43/47]
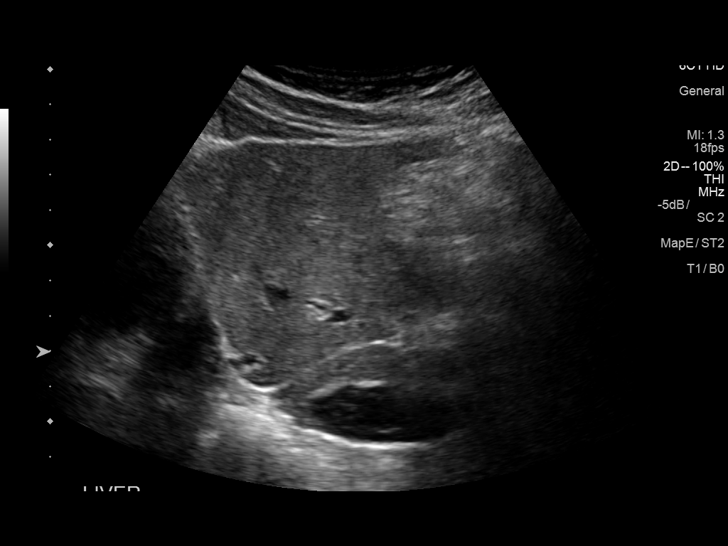
[im 47/47]
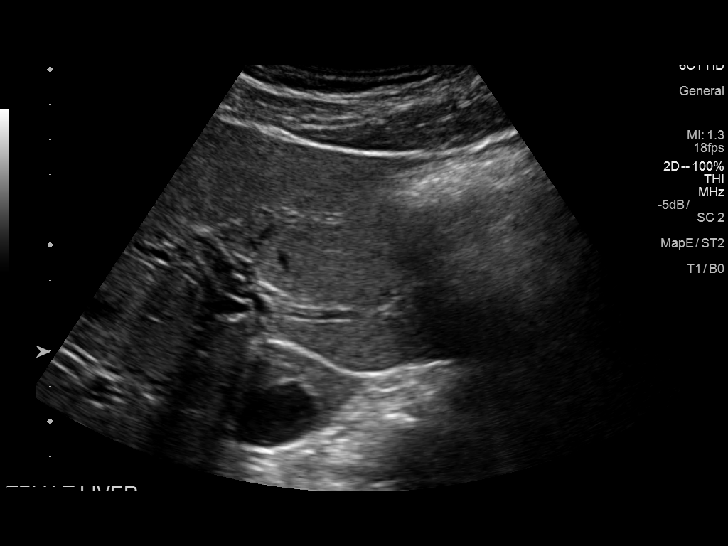

[14 of 25 positions shown; findings below may reference images not displayed]

FINDINGS: Gallbladder:

Probable layering 5 mm gallstone. No wall thickening or sonographic
Blain.

Common bile duct:

Diameter: Normal caliber, 4 mm

Liver:

Increased, heterogeneous echotexture throughout the liver. No focal
hepatic abnormality. Portal vein is patent on color Doppler imaging
with normal direction of blood flow towards the liver.
IMPRESSION: Heterogeneous, increased echotexture throughout the liver suggesting
early fatty infiltration.

Suspect small 5 mm layering gallstone. No evidence of acute
cholecystitis.

## 2019-10-25 NOTE — Telephone Encounter (Signed)
-----   Message from Heath Lark, MD sent at 10/25/2019 10:49 AM EDT ----- Regarding: next month appt can he come in sooner?

## 2019-10-25 NOTE — Telephone Encounter (Signed)
Called and given below message. Given earlier times. He is aware of appt times. Scheduling message sent to move time up for phlebotomy.

## 2019-10-25 NOTE — Telephone Encounter (Signed)
Called and left below message. Ask him to call the office back. ?

## 2019-11-19 ENCOUNTER — Inpatient Hospital Stay: Payer: Medicare Other | Attending: Hematology and Oncology

## 2019-11-19 ENCOUNTER — Other Ambulatory Visit: Payer: Self-pay

## 2019-11-19 ENCOUNTER — Inpatient Hospital Stay: Payer: Medicare Other

## 2019-11-19 ENCOUNTER — Inpatient Hospital Stay (HOSPITAL_BASED_OUTPATIENT_CLINIC_OR_DEPARTMENT_OTHER): Payer: Medicare Other | Admitting: Hematology and Oncology

## 2019-11-19 ENCOUNTER — Encounter: Payer: Self-pay | Admitting: Hematology and Oncology

## 2019-11-19 ENCOUNTER — Other Ambulatory Visit: Payer: Medicare Other

## 2019-11-19 ENCOUNTER — Ambulatory Visit: Payer: Medicare Other | Admitting: Hematology and Oncology

## 2019-11-19 VITALS — BP 122/73 | HR 74 | Temp 98.0°F | Resp 16

## 2019-11-19 DIAGNOSIS — D751 Secondary polycythemia: Secondary | ICD-10-CM

## 2019-11-19 DIAGNOSIS — Z79899 Other long term (current) drug therapy: Secondary | ICD-10-CM | POA: Diagnosis not present

## 2019-11-19 LAB — CBC WITH DIFFERENTIAL/PLATELET
Abs Immature Granulocytes: 0.01 10*3/uL (ref 0.00–0.07)
Basophils Absolute: 0 10*3/uL (ref 0.0–0.1)
Basophils Relative: 1 %
Eosinophils Absolute: 0 10*3/uL (ref 0.0–0.5)
Eosinophils Relative: 1 %
HCT: 53.3 % — ABNORMAL HIGH (ref 39.0–52.0)
Hemoglobin: 16.5 g/dL (ref 13.0–17.0)
Immature Granulocytes: 0 %
Lymphocytes Relative: 36 %
Lymphs Abs: 1.5 10*3/uL (ref 0.7–4.0)
MCH: 23.4 pg — ABNORMAL LOW (ref 26.0–34.0)
MCHC: 31 g/dL (ref 30.0–36.0)
MCV: 75.7 fL — ABNORMAL LOW (ref 80.0–100.0)
Monocytes Absolute: 0.5 10*3/uL (ref 0.1–1.0)
Monocytes Relative: 12 %
Neutro Abs: 2 10*3/uL (ref 1.7–7.7)
Neutrophils Relative %: 50 %
Platelets: 173 10*3/uL (ref 150–400)
RBC: 7.04 MIL/uL — ABNORMAL HIGH (ref 4.22–5.81)
RDW: 17.8 % — ABNORMAL HIGH (ref 11.5–15.5)
WBC: 4 10*3/uL (ref 4.0–10.5)
nRBC: 0 % (ref 0.0–0.2)

## 2019-11-19 NOTE — Patient Instructions (Signed)

## 2019-11-19 NOTE — Progress Notes (Signed)
500 gm phlebotomy obtained without difficulty.  Site unremarkable.  Refreshments offered.  No c/o's.

## 2019-11-19 NOTE — Assessment & Plan Note (Signed)
His hematocrit is getting stable with spacing out phlebotomy sessions I recommend phlebotomy every other month and I plan to see him again in 12 months for further follow-up.  He agreed He will continue aspirin to prevent risk of thrombosis

## 2019-11-19 NOTE — Progress Notes (Signed)
[place] OFFICE PROGRESS NOTE  [name], MD  ASSESSMENT & PLAN:  Polycythemia His hematocrit is getting stable with spacing out phlebotomy sessions I recommend phlebotomy every other month and I plan to see him again in 12 months for further follow-up.  He agreed He will continue aspirin to prevent risk of thrombosis   No orders of the defined types were placed in this encounter.   The total time spent in the appointment was 10 minutes encounter with patients including review of chart and various tests results, discussions about plan of care and coordination of care plan   All questions were answered. The patient knows to call the clinic with any problems, questions or concerns. No barriers to learning was detected.    Heath Lark, MD 5/21/202111:46 AM  INTERVAL HISTORY: Cory [name] 84 y.o. male returns for further follow-up for secondary polycythemia He tolerated phlebotomy well Denies chest pain or shortness of breath He admits that sometimes he does not drink enough water and that cause problems with phlebotomy He denies dizziness after each session of phlebotomy  SUMMARY OF HEMATOLOGIC HISTORY:  This is a pleasant gentleman who was found to have abnormal CBC with polycythemia In 2014 he had a bone marrow aspirate and biopsy that came back nondiagnostic for myeloproliferative disorder. JAK 2 mutation testing was negative. The patient had intermittent phlebotomy sessions and remain on aspirin  I have reviewed the past medical history, past surgical history, social history and family history with the patient and they are unchanged from previous note.  ALLERGIES:  has No Known Allergies.  MEDICATIONS:  Current Outpatient Medications  Medication Sig Dispense Refill  . aspirin EC 81 MG tablet Take 81 mg by mouth daily.    Marland Kitchen atorvastatin (LIPITOR) 40 MG tablet Take 40 mg by mouth daily.    . benazepril-hydrochlorthiazide (LOTENSIN HCT) 20-12.5 MG per  tablet Take 1 tablet by mouth every morning.    Marland Kitchen oxybutynin (DITROPAN-XL) 10 MG 24 hr tablet Take 10 mg by mouth daily.     No current facility-administered medications for this visit.     REVIEW OF SYSTEMS:   Constitutional: Denies fevers, chills or night sweats Eyes: Denies blurriness of vision Ears, nose, mouth, throat, and face: Denies mucositis or sore throat Respiratory: Denies cough, dyspnea or wheezes Cardiovascular: Denies palpitation, chest discomfort or lower extremity swelling Gastrointestinal:  Denies nausea, heartburn or change in bowel habits Skin: Denies abnormal skin rashes Lymphatics: Denies new lymphadenopathy or easy bruising Neurological:Denies numbness, tingling or new weaknesses Behavioral/Psych: Mood is stable, no new changes  All other systems were reviewed with the patient and are negative.  PHYSICAL EXAMINATION: ECOG PERFORMANCE STATUS: 0 - Asymptomatic  Vitals:   11/19/19 1117  BP: 129/72  Pulse: 73  Resp: 18  Temp: 98.4 F (36.9 C)  SpO2: 98%   Filed Weights   11/19/19 1117  Weight: 189 lb 3.2 oz (85.8 kg)    GENERAL:alert, no distress and comfortable NEURO: alert & oriented x 3 with fluent speech, no focal motor/sensory deficits  LABORATORY DATA:  I have reviewed the data as listed     Component Value Date/Time   NA 141 08/05/2017 0906   NA 144 03/03/2013 0944   K 3.8 08/05/2017 0906   K 3.6 03/03/2013 0944   CL 107 08/05/2017 0906   CL 106 08/04/2012 0957   CO2 21 (L) 08/05/2017 0906   CO2 26 03/03/2013 0944   GLUCOSE 111 (H) 08/05/2017 0906   GLUCOSE  132 03/03/2013 0944   GLUCOSE 108 (H) 08/04/2012 0957   BUN 16 08/05/2017 0906   BUN 19.6 03/03/2013 0944   CREATININE 1.17 08/05/2017 0906   CREATININE 1.2 03/03/2013 0944   CALCIUM 9.0 08/05/2017 0906   CALCIUM 9.3 03/03/2013 0944   PROT 6.9 08/05/2017 0906   PROT 6.9 03/03/2013 0944   ALBUMIN 4.0 08/05/2017 0906   ALBUMIN 3.9 03/03/2013 0944   AST 24 08/05/2017 0906    AST 20 03/03/2013 0944   ALT 25 08/05/2017 0906   ALT 27 03/03/2013 0944   ALKPHOS 74 08/05/2017 0906   ALKPHOS 69 03/03/2013 0944   BILITOT 1.3 (H) 08/05/2017 0906   BILITOT 1.29 (H) 03/03/2013 0944   GFRNONAA 57 (L) 08/05/2017 0906   GFRAA >60 08/05/2017 0906    No results found for: SPEP, UPEP  Lab Results  Component Value Date   WBC 4.0 11/19/2019   NEUTROABS 2.0 11/19/2019   HGB 16.5 11/19/2019   HCT 53.3 (H) 11/19/2019   MCV 75.7 (L) 11/19/2019   PLT 173 11/19/2019      Chemistry      Component Value Date/Time   NA 141 08/05/2017 0906   NA 144 03/03/2013 0944   K 3.8 08/05/2017 0906   K 3.6 03/03/2013 0944   CL 107 08/05/2017 0906   CL 106 08/04/2012 0957   CO2 21 (L) 08/05/2017 0906   CO2 26 03/03/2013 0944   BUN 16 08/05/2017 0906   BUN 19.6 03/03/2013 0944   CREATININE 1.17 08/05/2017 0906   CREATININE 1.2 03/03/2013 0944      Component Value Date/Time   CALCIUM 9.0 08/05/2017 0906   CALCIUM 9.3 03/03/2013 0944   ALKPHOS 74 08/05/2017 0906   ALKPHOS 69 03/03/2013 0944   AST 24 08/05/2017 0906   AST 20 03/03/2013 0944   ALT 25 08/05/2017 0906   ALT 27 03/03/2013 0944   BILITOT 1.3 (H) 08/05/2017 0906   BILITOT 1.29 (H) 03/03/2013 [id number]

## 2019-11-22 ENCOUNTER — Telehealth: Payer: Self-pay | Admitting: Hematology and Oncology

## 2019-11-22 NOTE — Telephone Encounter (Signed)
Scheduled per 5/21 sch message. Mailing pt calendar with appts.

## 2019-11-30 ENCOUNTER — Telehealth: Payer: Self-pay | Admitting: Hematology and Oncology

## 2019-11-30 NOTE — Telephone Encounter (Signed)
Called pt per 6/1 sch message - left message with new appt date and time

## 2020-01-12 ENCOUNTER — Inpatient Hospital Stay: Payer: Medicare Other

## 2020-01-12 ENCOUNTER — Inpatient Hospital Stay: Payer: Medicare Other | Attending: Hematology and Oncology

## 2020-01-12 ENCOUNTER — Other Ambulatory Visit: Payer: Self-pay

## 2020-01-12 DIAGNOSIS — D751 Secondary polycythemia: Secondary | ICD-10-CM | POA: Insufficient documentation

## 2020-01-12 LAB — CBC WITH DIFFERENTIAL/PLATELET
Abs Immature Granulocytes: 0.02 10*3/uL (ref 0.00–0.07)
Basophils Absolute: 0 10*3/uL (ref 0.0–0.1)
Basophils Relative: 1 %
Eosinophils Absolute: 0.1 10*3/uL (ref 0.0–0.5)
Eosinophils Relative: 2 %
HCT: 52 % (ref 39.0–52.0)
Hemoglobin: 16 g/dL (ref 13.0–17.0)
Immature Granulocytes: 1 %
Lymphocytes Relative: 41 %
Lymphs Abs: 1.8 10*3/uL (ref 0.7–4.0)
MCH: 22.6 pg — ABNORMAL LOW (ref 26.0–34.0)
MCHC: 30.8 g/dL (ref 30.0–36.0)
MCV: 73.3 fL — ABNORMAL LOW (ref 80.0–100.0)
Monocytes Absolute: 0.6 10*3/uL (ref 0.1–1.0)
Monocytes Relative: 13 %
Neutro Abs: 1.9 10*3/uL (ref 1.7–7.7)
Neutrophils Relative %: 42 %
Platelets: 197 10*3/uL (ref 150–400)
RBC: 7.09 MIL/uL — ABNORMAL HIGH (ref 4.22–5.81)
RDW: 18 % — ABNORMAL HIGH (ref 11.5–15.5)
WBC: 4.3 10*3/uL (ref 4.0–10.5)
nRBC: 0 % (ref 0.0–0.2)

## 2020-01-12 NOTE — Progress Notes (Signed)
Cory [name] presents today for phlebotomy per MD orders. Phlebotomy procedure started at 1035, 16 G phlebotomy needle inserted into LAC, and ended at 1055. IV needle removed intact. 250 grams removed. Per Dr. Alvy Bimler, okay to stop and advise patient to drink a lot of water before next treatment, patient informed and he verbalized understanding. Patient declined observation for 30 minutes after procedure. Vitals obtained and documented.Patient tolerated procedure well.

## 2020-01-12 NOTE — Patient Instructions (Signed)

## 2020-01-14 ENCOUNTER — Other Ambulatory Visit: Payer: Medicare Other

## 2020-01-19 ENCOUNTER — Other Ambulatory Visit: Payer: Medicare Other

## 2020-01-19 DIAGNOSIS — M25512 Pain in left shoulder: Secondary | ICD-10-CM | POA: Diagnosis not present

## 2020-02-24 DIAGNOSIS — D751 Secondary polycythemia: Secondary | ICD-10-CM | POA: Diagnosis not present

## 2020-02-24 DIAGNOSIS — K219 Gastro-esophageal reflux disease without esophagitis: Secondary | ICD-10-CM | POA: Diagnosis not present

## 2020-02-24 DIAGNOSIS — E7800 Pure hypercholesterolemia, unspecified: Secondary | ICD-10-CM | POA: Diagnosis not present

## 2020-02-24 DIAGNOSIS — I10 Essential (primary) hypertension: Secondary | ICD-10-CM | POA: Diagnosis not present

## 2020-02-24 DIAGNOSIS — Z0000 Encounter for general adult medical examination without abnormal findings: Secondary | ICD-10-CM | POA: Diagnosis not present

## 2020-02-24 DIAGNOSIS — R7303 Prediabetes: Secondary | ICD-10-CM | POA: Diagnosis not present

## 2020-02-24 DIAGNOSIS — N5201 Erectile dysfunction due to arterial insufficiency: Secondary | ICD-10-CM | POA: Diagnosis not present

## 2020-02-24 DIAGNOSIS — C61 Malignant neoplasm of prostate: Secondary | ICD-10-CM | POA: Diagnosis not present

## 2020-02-24 DIAGNOSIS — G459 Transient cerebral ischemic attack, unspecified: Secondary | ICD-10-CM | POA: Diagnosis not present

## 2020-02-24 DIAGNOSIS — Z1211 Encounter for screening for malignant neoplasm of colon: Secondary | ICD-10-CM | POA: Diagnosis not present

## 2020-02-24 DIAGNOSIS — Z1389 Encounter for screening for other disorder: Secondary | ICD-10-CM | POA: Diagnosis not present

## 2020-02-25 DIAGNOSIS — Z0000 Encounter for general adult medical examination without abnormal findings: Secondary | ICD-10-CM | POA: Diagnosis not present

## 2020-02-25 DIAGNOSIS — Z1211 Encounter for screening for malignant neoplasm of colon: Secondary | ICD-10-CM | POA: Diagnosis not present

## 2020-03-10 ENCOUNTER — Inpatient Hospital Stay: Payer: Medicare Other

## 2020-03-10 ENCOUNTER — Other Ambulatory Visit: Payer: Medicare Other

## 2020-03-10 DIAGNOSIS — Z23 Encounter for immunization: Secondary | ICD-10-CM | POA: Diagnosis not present

## 2020-03-13 DIAGNOSIS — M25512 Pain in left shoulder: Secondary | ICD-10-CM | POA: Diagnosis not present

## 2020-03-20 ENCOUNTER — Other Ambulatory Visit: Payer: Self-pay

## 2020-03-20 ENCOUNTER — Inpatient Hospital Stay: Payer: Medicare Other

## 2020-03-20 ENCOUNTER — Inpatient Hospital Stay: Payer: Medicare Other | Attending: Hematology and Oncology

## 2020-03-20 VITALS — BP 136/77 | HR 68 | Temp 98.4°F | Resp 18

## 2020-03-20 DIAGNOSIS — D751 Secondary polycythemia: Secondary | ICD-10-CM

## 2020-03-20 LAB — CBC WITH DIFFERENTIAL/PLATELET
Abs Immature Granulocytes: 0.02 10*3/uL (ref 0.00–0.07)
Basophils Absolute: 0 10*3/uL (ref 0.0–0.1)
Basophils Relative: 1 %
Eosinophils Absolute: 0.1 10*3/uL (ref 0.0–0.5)
Eosinophils Relative: 2 %
HCT: 52.7 % — ABNORMAL HIGH (ref 39.0–52.0)
Hemoglobin: 16.2 g/dL (ref 13.0–17.0)
Immature Granulocytes: 0 %
Lymphocytes Relative: 38 %
Lymphs Abs: 1.7 10*3/uL (ref 0.7–4.0)
MCH: 23.2 pg — ABNORMAL LOW (ref 26.0–34.0)
MCHC: 30.7 g/dL (ref 30.0–36.0)
MCV: 75.4 fL — ABNORMAL LOW (ref 80.0–100.0)
Monocytes Absolute: 0.5 10*3/uL (ref 0.1–1.0)
Monocytes Relative: 11 %
Neutro Abs: 2.1 10*3/uL (ref 1.7–7.7)
Neutrophils Relative %: 48 %
Platelets: 189 10*3/uL (ref 150–400)
RBC: 6.99 MIL/uL — ABNORMAL HIGH (ref 4.22–5.81)
RDW: 20.6 % — ABNORMAL HIGH (ref 11.5–15.5)
WBC: 4.5 10*3/uL (ref 4.0–10.5)
nRBC: 0 % (ref 0.0–0.2)

## 2020-03-20 NOTE — Patient Instructions (Signed)

## 2020-03-20 NOTE — Progress Notes (Signed)
Cory [name] presents today for phlebotomy per MD orders. Phlebotomy procedure started at 1527 w/18 G phlebotomy kit and ended at 1543. 512 grams removed. Patient observed for 30 minutes after procedure without any incident. Patient tolerated procedure well. IV needle removed intact.

## 2020-03-21 ENCOUNTER — Other Ambulatory Visit: Payer: Medicare Other

## 2020-05-02 ENCOUNTER — Telehealth: Payer: Self-pay | Admitting: Hematology and Oncology

## 2020-05-02 NOTE — Telephone Encounter (Signed)
Released records to pt. Called today 05-02-20 to advise records are ready for pick up.   Release:  [id number]

## 2020-05-04 DIAGNOSIS — N400 Enlarged prostate without lower urinary tract symptoms: Secondary | ICD-10-CM | POA: Diagnosis not present

## 2020-05-05 ENCOUNTER — Inpatient Hospital Stay: Payer: Medicare Other | Attending: Hematology and Oncology

## 2020-05-05 ENCOUNTER — Inpatient Hospital Stay: Payer: Medicare Other

## 2020-05-05 ENCOUNTER — Other Ambulatory Visit: Payer: Self-pay

## 2020-05-05 VITALS — BP 131/81 | HR 72 | Temp 98.2°F | Resp 18

## 2020-05-05 DIAGNOSIS — D751 Secondary polycythemia: Secondary | ICD-10-CM | POA: Diagnosis not present

## 2020-05-05 LAB — CBC WITH DIFFERENTIAL/PLATELET
Abs Immature Granulocytes: 0 10*3/uL (ref 0.00–0.07)
Basophils Absolute: 0 10*3/uL (ref 0.0–0.1)
Basophils Relative: 1 %
Eosinophils Absolute: 0.1 10*3/uL (ref 0.0–0.5)
Eosinophils Relative: 1 %
HCT: 50.2 % (ref 39.0–52.0)
Hemoglobin: 15.5 g/dL (ref 13.0–17.0)
Immature Granulocytes: 0 %
Lymphocytes Relative: 28 %
Lymphs Abs: 1.7 10*3/uL (ref 0.7–4.0)
MCH: 23.1 pg — ABNORMAL LOW (ref 26.0–34.0)
MCHC: 30.9 g/dL (ref 30.0–36.0)
MCV: 74.7 fL — ABNORMAL LOW (ref 80.0–100.0)
Monocytes Absolute: 0.6 10*3/uL (ref 0.1–1.0)
Monocytes Relative: 10 %
Neutro Abs: 3.7 10*3/uL (ref 1.7–7.7)
Neutrophils Relative %: 60 %
Platelets: 182 10*3/uL (ref 150–400)
RBC: 6.72 MIL/uL — ABNORMAL HIGH (ref 4.22–5.81)
RDW: 18.6 % — ABNORMAL HIGH (ref 11.5–15.5)
WBC: 6.1 10*3/uL (ref 4.0–10.5)
nRBC: 0 % (ref 0.0–0.2)

## 2020-05-05 NOTE — Progress Notes (Signed)
Cory [name] presents today for phlebotomy per MD orders. Phlebotomy procedure started at 1301 w/16 G phlebotomy kit and ended at 1328 489 grams removed and needle clotted. Patient observed for 30 minutes after procedure without any incident. Patient tolerated procedure well. IV needle removed intact. Drink and snack given during procedure. VSS

## 2020-05-05 NOTE — Patient Instructions (Signed)

## 2020-05-17 DIAGNOSIS — R351 Nocturia: Secondary | ICD-10-CM | POA: Diagnosis not present

## 2020-05-17 DIAGNOSIS — N3941 Urge incontinence: Secondary | ICD-10-CM | POA: Diagnosis not present

## 2020-05-22 ENCOUNTER — Other Ambulatory Visit: Payer: Medicare Other

## 2020-06-19 ENCOUNTER — Inpatient Hospital Stay: Payer: Medicare Other | Attending: Hematology and Oncology

## 2020-06-19 ENCOUNTER — Inpatient Hospital Stay: Payer: Medicare Other

## 2020-06-19 ENCOUNTER — Other Ambulatory Visit: Payer: Self-pay

## 2020-06-19 VITALS — BP 136/68 | HR 75 | Temp 98.3°F | Resp 18

## 2020-06-19 DIAGNOSIS — D751 Secondary polycythemia: Secondary | ICD-10-CM | POA: Diagnosis not present

## 2020-06-19 LAB — CBC WITH DIFFERENTIAL/PLATELET
Abs Immature Granulocytes: 0.01 10*3/uL (ref 0.00–0.07)
Basophils Absolute: 0 10*3/uL (ref 0.0–0.1)
Basophils Relative: 1 %
Eosinophils Absolute: 0 10*3/uL (ref 0.0–0.5)
Eosinophils Relative: 1 %
HCT: 50.4 % (ref 39.0–52.0)
Hemoglobin: 15.7 g/dL (ref 13.0–17.0)
Immature Granulocytes: 0 %
Lymphocytes Relative: 36 %
Lymphs Abs: 1.5 10*3/uL (ref 0.7–4.0)
MCH: 23.5 pg — ABNORMAL LOW (ref 26.0–34.0)
MCHC: 31.2 g/dL (ref 30.0–36.0)
MCV: 75.6 fL — ABNORMAL LOW (ref 80.0–100.0)
Monocytes Absolute: 0.5 10*3/uL (ref 0.1–1.0)
Monocytes Relative: 12 %
Neutro Abs: 2 10*3/uL (ref 1.7–7.7)
Neutrophils Relative %: 50 %
Platelets: 184 10*3/uL (ref 150–400)
RBC: 6.67 MIL/uL — ABNORMAL HIGH (ref 4.22–5.81)
RDW: 17.4 % — ABNORMAL HIGH (ref 11.5–15.5)
WBC: 4.1 10*3/uL (ref 4.0–10.5)
nRBC: 0 % (ref 0.0–0.2)

## 2020-06-19 NOTE — Progress Notes (Signed)
Pt to receive therapeutic phlebotomy today per MD Alvy Bimler.  Cory [name] presents today for phlebotomy per MD orders. Phlebotomy procedure started at 0914 and ended at 0922. 500 grams removed. Patient declined to wait for 30 minutes after procedure.  States he will take it easy today and have a snack when he gets home. Patient tolerated procedure well.  VS stable.  Ambulatory w/steady gait. 16 G LAC IV needle removed intact.

## 2020-06-19 NOTE — Patient Instructions (Signed)

## 2020-06-21 ENCOUNTER — Other Ambulatory Visit: Payer: Medicare Other

## 2020-06-29 ENCOUNTER — Other Ambulatory Visit: Payer: Medicare Other

## 2020-07-21 ENCOUNTER — Other Ambulatory Visit: Payer: Medicare Other

## 2020-08-25 ENCOUNTER — Inpatient Hospital Stay: Payer: Medicare Other

## 2020-08-25 ENCOUNTER — Other Ambulatory Visit: Payer: Self-pay

## 2020-08-25 ENCOUNTER — Inpatient Hospital Stay: Payer: Medicare Other | Attending: Hematology and Oncology

## 2020-08-25 VITALS — BP 123/61 | HR 91 | Temp 98.6°F | Resp 18

## 2020-08-25 DIAGNOSIS — D751 Secondary polycythemia: Secondary | ICD-10-CM

## 2020-08-25 LAB — CBC WITH DIFFERENTIAL/PLATELET
Abs Immature Granulocytes: 0.02 10*3/uL (ref 0.00–0.07)
Basophils Absolute: 0 10*3/uL (ref 0.0–0.1)
Basophils Relative: 1 %
Eosinophils Absolute: 0.1 10*3/uL (ref 0.0–0.5)
Eosinophils Relative: 1 %
HCT: 51 % (ref 39.0–52.0)
Hemoglobin: 15.6 g/dL (ref 13.0–17.0)
Immature Granulocytes: 1 %
Lymphocytes Relative: 44 %
Lymphs Abs: 2 10*3/uL (ref 0.7–4.0)
MCH: 23 pg — ABNORMAL LOW (ref 26.0–34.0)
MCHC: 30.6 g/dL (ref 30.0–36.0)
MCV: 75.2 fL — ABNORMAL LOW (ref 80.0–100.0)
Monocytes Absolute: 0.4 10*3/uL (ref 0.1–1.0)
Monocytes Relative: 10 %
Neutro Abs: 1.9 10*3/uL (ref 1.7–7.7)
Neutrophils Relative %: 43 %
Platelets: 191 10*3/uL (ref 150–400)
RBC: 6.78 MIL/uL — ABNORMAL HIGH (ref 4.22–5.81)
RDW: 18.6 % — ABNORMAL HIGH (ref 11.5–15.5)
WBC: 4.4 10*3/uL (ref 4.0–10.5)
nRBC: 0 % (ref 0.0–0.2)

## 2020-08-25 NOTE — Patient Instructions (Signed)

## 2020-08-25 NOTE — Progress Notes (Signed)
Cory [name] presents today for phlebotomy per MD orders. Phlebotomy procedure started  w/ 16 G phlebotomy kit at 1240 and ended at 1252. 520 grams removed. Patient declined observation post procedure.  Patient tolerated procedure well. IV needle removed intact.

## 2020-08-29 DIAGNOSIS — I10 Essential (primary) hypertension: Secondary | ICD-10-CM | POA: Diagnosis not present

## 2020-08-29 DIAGNOSIS — D751 Secondary polycythemia: Secondary | ICD-10-CM | POA: Diagnosis not present

## 2020-08-29 DIAGNOSIS — R7303 Prediabetes: Secondary | ICD-10-CM | POA: Diagnosis not present

## 2020-08-29 DIAGNOSIS — E7800 Pure hypercholesterolemia, unspecified: Secondary | ICD-10-CM | POA: Diagnosis not present

## 2020-08-29 DIAGNOSIS — Z8619 Personal history of other infectious and parasitic diseases: Secondary | ICD-10-CM | POA: Diagnosis not present

## 2020-09-18 ENCOUNTER — Other Ambulatory Visit: Payer: Medicare Other

## 2020-09-29 DIAGNOSIS — Z23 Encounter for immunization: Secondary | ICD-10-CM | POA: Diagnosis not present

## 2020-10-19 ENCOUNTER — Inpatient Hospital Stay: Payer: Medicare Other | Attending: Hematology and Oncology | Admitting: Hematology and Oncology

## 2020-10-19 ENCOUNTER — Inpatient Hospital Stay: Payer: Medicare Other

## 2020-10-19 ENCOUNTER — Other Ambulatory Visit: Payer: Self-pay

## 2020-10-19 ENCOUNTER — Encounter: Payer: Self-pay | Admitting: Hematology and Oncology

## 2020-10-19 VITALS — BP 162/72 | HR 72 | Temp 98.1°F | Resp 18

## 2020-10-19 VITALS — BP 166/81 | HR 77 | Temp 97.4°F | Resp 18 | Ht 69.0 in | Wt 193.2 lb

## 2020-10-19 DIAGNOSIS — D751 Secondary polycythemia: Secondary | ICD-10-CM | POA: Diagnosis not present

## 2020-10-19 DIAGNOSIS — Z7982 Long term (current) use of aspirin: Secondary | ICD-10-CM | POA: Insufficient documentation

## 2020-10-19 DIAGNOSIS — I10 Essential (primary) hypertension: Secondary | ICD-10-CM | POA: Diagnosis not present

## 2020-10-19 LAB — CBC WITH DIFFERENTIAL/PLATELET
Abs Immature Granulocytes: 0.01 10*3/uL (ref 0.00–0.07)
Basophils Absolute: 0 10*3/uL (ref 0.0–0.1)
Basophils Relative: 1 %
Eosinophils Absolute: 0.1 10*3/uL (ref 0.0–0.5)
Eosinophils Relative: 1 %
HCT: 50.3 % (ref 39.0–52.0)
Hemoglobin: 15.2 g/dL (ref 13.0–17.0)
Immature Granulocytes: 0 %
Lymphocytes Relative: 40 %
Lymphs Abs: 1.8 10*3/uL (ref 0.7–4.0)
MCH: 22.4 pg — ABNORMAL LOW (ref 26.0–34.0)
MCHC: 30.2 g/dL (ref 30.0–36.0)
MCV: 74.2 fL — ABNORMAL LOW (ref 80.0–100.0)
Monocytes Absolute: 0.6 10*3/uL (ref 0.1–1.0)
Monocytes Relative: 13 %
Neutro Abs: 2 10*3/uL (ref 1.7–7.7)
Neutrophils Relative %: 45 %
Platelets: 208 10*3/uL (ref 150–400)
RBC: 6.78 MIL/uL — ABNORMAL HIGH (ref 4.22–5.81)
RDW: 19.1 % — ABNORMAL HIGH (ref 11.5–15.5)
WBC: 4.4 10*3/uL (ref 4.0–10.5)
nRBC: 0 % (ref 0.0–0.2)

## 2020-10-19 NOTE — Progress Notes (Signed)
[name] OFFICE PROGRESS NOTE  [name], MD  ASSESSMENT & PLAN:  Polycythemia His blood count is stable with spacing out phlebotomy sessions I recommend phlebotomy every other month and I plan to see him again in 12 months for further follow-up.  He agreed He will continue aspirin to prevent risk of thrombosis  Essential hypertension He has mild intermittent hypertension His documented blood pressure on March 1 was within normal limits Today's blood pressure could be elevated due to whitecoat hypertension Observe closely for now   Orders Placed This Encounter  Procedures  . CBC with Differential/Platelet    Standing Status:   Standing    Number of Occurrences:   22    Standing Expiration Date:   10/19/2021    The total time spent in the appointment was 20 minutes encounter with patients including review of chart and various tests results, discussions about plan of care and coordination of care plan   All questions were answered. The patient knows to call the clinic with any problems, questions or concerns. No barriers to learning was detected.    Heath Lark, MD 4/21/20221:05 PM  INTERVAL HISTORY: Cory [name] 85 y.o. male returns for further follow-up for chronic erythrocytosis requiring phlebotomy He is doing well He tolerated phlebotomy well without side effects  SUMMARY OF HEMATOLOGIC HISTORY:  This is a pleasant gentleman who was found to have abnormal CBC with polycythemia In 2014 he had a bone marrow aspirate and biopsy that came back nondiagnostic for myeloproliferative disorder. JAK 2 mutation testing was negative. The patient had intermittent phlebotomy sessions and remain on aspirin  I have reviewed the past medical history, past surgical history, social history and family history with the patient and they are unchanged from previous note.  ALLERGIES:  has No Known Allergies.  MEDICATIONS:  Current Outpatient Medications  Medication  Sig Dispense Refill  . aspirin EC 81 MG tablet Take 81 mg by mouth daily.    Marland Kitchen atorvastatin (LIPITOR) 40 MG tablet Take 40 mg by mouth daily.    . benazepril (LOTENSIN) 40 MG tablet Take 40 mg by mouth daily.    Marland Kitchen oxybutynin (DITROPAN-XL) 10 MG 24 hr tablet Take 10 mg by mouth daily.     No current facility-administered medications for this visit.     REVIEW OF SYSTEMS:   Constitutional: Denies fevers, chills or night sweats Eyes: Denies blurriness of vision Ears, nose, mouth, throat, and face: Denies mucositis or sore throat Respiratory: Denies cough, dyspnea or wheezes Cardiovascular: Denies palpitation, chest discomfort or lower extremity swelling Gastrointestinal:  Denies nausea, heartburn or change in bowel habits Skin: Denies abnormal skin rashes Lymphatics: Denies new lymphadenopathy or easy bruising Neurological:Denies numbness, tingling or new weaknesses Behavioral/Psych: Mood is stable, no new changes  All other systems were reviewed with the patient and are negative.  PHYSICAL EXAMINATION: ECOG PERFORMANCE STATUS: 0 - Asymptomatic  Vitals:   10/19/20 1117  BP: (!) 166/81  Pulse: 77  Resp: 18  Temp: (!) 97.4 F (36.3 C)  SpO2: 100%   Filed Weights   10/19/20 1117  Weight: 193 lb 3.2 oz (87.6 kg)    GENERAL:alert, no distress and comfortable NEURO: alert & oriented x 3 with fluent speech, no focal motor/sensory deficits  LABORATORY DATA:  I have reviewed the data as listed     Component Value Date/Time   NA 141 08/05/2017 0906   NA 144 03/03/2013 0944   K 3.8 08/05/2017 0906   K 3.6  03/03/2013 0944   CL 107 08/05/2017 0906   CL 106 08/04/2012 0957   CO2 21 (L) 08/05/2017 0906   CO2 26 03/03/2013 0944   GLUCOSE 111 (H) 08/05/2017 0906   GLUCOSE 132 03/03/2013 0944   GLUCOSE 108 (H) 08/04/2012 0957   BUN 16 08/05/2017 0906   BUN 19.6 03/03/2013 0944   CREATININE 1.17 08/05/2017 0906   CREATININE 1.2 03/03/2013 0944   CALCIUM 9.0 08/05/2017 0906    CALCIUM 9.3 03/03/2013 0944   PROT 6.9 08/05/2017 0906   PROT 6.9 03/03/2013 0944   ALBUMIN 4.0 08/05/2017 0906   ALBUMIN 3.9 03/03/2013 0944   AST 24 08/05/2017 0906   AST 20 03/03/2013 0944   ALT 25 08/05/2017 0906   ALT 27 03/03/2013 0944   ALKPHOS 74 08/05/2017 0906   ALKPHOS 69 03/03/2013 0944   BILITOT 1.3 (H) 08/05/2017 0906   BILITOT 1.29 (H) 03/03/2013 0944   GFRNONAA 57 (L) 08/05/2017 0906   GFRAA >60 08/05/2017 0906    No results found for: SPEP, UPEP  Lab Results  Component Value Date   WBC 4.4 10/19/2020   NEUTROABS 2.0 10/19/2020   HGB 15.2 10/19/2020   HCT 50.3 10/19/2020   MCV 74.2 (L) 10/19/2020   PLT 208 10/19/2020      Chemistry      Component Value Date/Time   NA 141 08/05/2017 0906   NA 144 03/03/2013 0944   K 3.8 08/05/2017 0906   K 3.6 03/03/2013 0944   CL 107 08/05/2017 0906   CL 106 08/04/2012 0957   CO2 21 (L) 08/05/2017 0906   CO2 26 03/03/2013 0944   BUN 16 08/05/2017 0906   BUN 19.6 03/03/2013 0944   CREATININE 1.17 08/05/2017 0906   CREATININE 1.2 03/03/2013 0944      Component Value Date/Time   CALCIUM 9.0 08/05/2017 0906   CALCIUM 9.3 03/03/2013 0944   ALKPHOS 74 08/05/2017 0906   ALKPHOS 69 03/03/2013 0944   AST 24 08/05/2017 0906   AST 20 03/03/2013 0944   ALT 25 08/05/2017 0906   ALT 27 03/03/2013 0944   BILITOT 1.3 (H) 08/05/2017 0906   BILITOT 1.29 (H) 03/03/2013 1478

## 2020-10-19 NOTE — Progress Notes (Signed)
To remove 1 unit of blood regardless of labs per Heath Lark, MD.

## 2020-10-19 NOTE — Patient Instructions (Signed)

## 2020-10-19 NOTE — Assessment & Plan Note (Signed)
His blood count is stable with spacing out phlebotomy sessions I recommend phlebotomy every other month and I plan to see him again in 12 months for further follow-up.  He agreed He will continue aspirin to prevent risk of thrombosis

## 2020-10-19 NOTE — Progress Notes (Signed)
Cory Corleypresents today for phlebotomy per MD orders. Phlebotomy procedure started at1255 w/16 G phlebotomy kitand ended at 1311 502 grams removed . Patient refused observation for 30 minutes after procedure. Patient tolerated procedure well. IV needle removed intact. Drink and snack given during procedure. VSS

## 2020-10-19 NOTE — Assessment & Plan Note (Signed)
He has mild intermittent hypertension His documented blood pressure on March 1 was within normal limits Today's blood pressure could be elevated due to whitecoat hypertension Observe closely for now

## 2020-11-17 ENCOUNTER — Other Ambulatory Visit: Payer: Medicare Other

## 2020-11-17 ENCOUNTER — Ambulatory Visit: Payer: Medicare Other

## 2020-11-17 ENCOUNTER — Ambulatory Visit: Payer: Medicare Other | Admitting: Hematology and Oncology

## 2020-12-19 ENCOUNTER — Other Ambulatory Visit: Payer: Self-pay

## 2020-12-19 ENCOUNTER — Inpatient Hospital Stay: Payer: Medicare Other | Attending: Hematology and Oncology

## 2020-12-19 ENCOUNTER — Inpatient Hospital Stay: Payer: Medicare Other

## 2020-12-19 VITALS — BP 129/71 | HR 85 | Temp 98.4°F | Resp 18

## 2020-12-19 DIAGNOSIS — D751 Secondary polycythemia: Secondary | ICD-10-CM

## 2020-12-19 LAB — CBC WITH DIFFERENTIAL/PLATELET
Abs Immature Granulocytes: 0.01 10*3/uL (ref 0.00–0.07)
Basophils Absolute: 0 10*3/uL (ref 0.0–0.1)
Basophils Relative: 1 %
Eosinophils Absolute: 0 10*3/uL (ref 0.0–0.5)
Eosinophils Relative: 1 %
HCT: 50.3 % (ref 39.0–52.0)
Hemoglobin: 15.5 g/dL (ref 13.0–17.0)
Immature Granulocytes: 0 %
Lymphocytes Relative: 42 %
Lymphs Abs: 1.6 10*3/uL (ref 0.7–4.0)
MCH: 22.6 pg — ABNORMAL LOW (ref 26.0–34.0)
MCHC: 30.8 g/dL (ref 30.0–36.0)
MCV: 73.3 fL — ABNORMAL LOW (ref 80.0–100.0)
Monocytes Absolute: 0.4 10*3/uL (ref 0.1–1.0)
Monocytes Relative: 12 %
Neutro Abs: 1.7 10*3/uL (ref 1.7–7.7)
Neutrophils Relative %: 44 %
Platelets: 189 10*3/uL (ref 150–400)
RBC: 6.86 MIL/uL — ABNORMAL HIGH (ref 4.22–5.81)
RDW: 19.7 % — ABNORMAL HIGH (ref 11.5–15.5)
WBC: 3.8 10*3/uL — ABNORMAL LOW (ref 4.0–10.5)
nRBC: 0 % (ref 0.0–0.2)

## 2020-12-19 NOTE — Progress Notes (Signed)
Pt here for Therapeutic Phlebotomy.  RBC 3.8 today.  510gm of blood removed this visit.  Pt drank 16oz of Crangrape juice and encouraged to eat.  Pt verbalized and demonstrated understanding of teaching.  PIV removed and blood disposed of per hospital protocol.  Post VS remained WNL.  Pt ambulated out of the infusion clinic.

## 2020-12-19 NOTE — Patient Instructions (Signed)

## 2020-12-22 NOTE — Progress Notes (Signed)
[facility] [address] [place] Phone: [phone] Subjective:   I Kandace Blitz am serving as a Education administrator for Dr. Hulan Saas.  This visit occurred during the SARS-CoV-2 public health emergency.  Safety protocols were in place, including screening questions prior to the visit, additional usage of staff PPE, and extensive cleaning of exam room while observing appropriate contact time as indicated for disinfecting solutions.   I'm seeing this patient by the request  of:  [name], MD  CC: Back and hip pain  [id number]  Genesis Paget is a 85 y.o. male coming in with complaint of hip pain. Patient states lateral quad pain.   Onset- 1 month Location - lateral right hip and thigh  Duration-  Character-  Aggravating factors- after sitting the first few steps  Reliving factors-  Therapies tried- injections, ice, heat, oral medication  Severity- 7/10  Past medical history for polycythemia and erythrocytosis undergoing phlebotomy. Per patient's imaging does appear in 2011 with patient.  Plan fusion of the L4-L5  Last MRI was before surgical intervention of the lumbar spine showing the patient did have focal soft disc protrusion at L4-L5 causing impingement of the left L5, left S1 and S2 nerve roots.  Past Medical History:  Diagnosis Date   Difficult intubation 02/23/2014   Glidescope for anterior airway   Erythrocytosis    GERD (gastroesophageal reflux disease)    History of kidney stones    Hypertension 01/27/2012   Prostate cancer ([place])    Thalassemia trait    Past Surgical History:  Procedure Laterality Date   BACK SURGERY  2007   CHOLECYSTECTOMY N/A 08/05/2017   Procedure: LAPAROSCOPIC CHOLECYSTECTOMY WITH INTRAOPERATIVE CHOLANGIOGRAM;  Surgeon: Donnie Mesa, MD;  Location: [facility];  Service: General;  Laterality: N/A;   COLONOSCOPY     KNEE ARTHROSCOPY     many yrs ago   LYMPHADENECTOMY Bilateral 02/23/2014    Procedure: PELVIC LYMPH NODE DISSECTION;  Surgeon: Alexis Frock, MD;  Location: [facility];  Service: Urology;  Laterality: Bilateral;   ROBOT ASSISTED LAPAROSCOPIC RADICAL PROSTATECTOMY N/A 02/23/2014   Procedure: ROBOTIC ASSISTED LAPAROSCOPIC RADICAL PROSTATECTOMY WITH INDOCYANINE GREEN DYE INJECTION;  Surgeon: Alexis Frock, MD;  Location: [facility];  Service: Urology;  Laterality: N/A;   Social History   Socioeconomic History   Marital status: Married    Spouse name: Not on file   Number of children: Not on file   Years of education: Not on file   Highest education level: Not on file  Occupational History   Not on file  Tobacco Use   Smoking status: Former    Pack years: 0.00    Types: Cigarettes    Quit date: 02/18/1979    Years since quitting: 41.8   Smokeless tobacco: Never  Vaping Use   Vaping Use: Never used  Substance and Sexual Activity   Alcohol use: Yes    Comment: rare   Drug use: No   Sexual activity: Not on file  Other Topics Concern   Not on file  Social History Narrative   Not on file   Social Determinants of Health   Financial Resource Strain: Not on file  Food Insecurity: Not on file  Transportation Needs: Not on file  Physical Activity: Not on file  Stress: Not on file  Social Connections: Not on file   No Known Allergies Family History  Problem Relation Age of Onset   Heart attack Mother 18  Current Outpatient Medications (Cardiovascular):    atorvastatin (LIPITOR) 40 MG tablet, Take 40 mg by mouth daily.   benazepril (LOTENSIN) 40 MG tablet, Take 40 mg by mouth daily.   Current Outpatient Medications (Analgesics):    aspirin EC 81 MG tablet, Take 81 mg by mouth daily.   Current Outpatient Medications (Other):    oxybutynin (DITROPAN-XL) 10 MG 24 hr tablet, Take 10 mg by mouth daily.   Reviewed prior external information including notes and imaging from  primary care provider As well as notes that were available from care everywhere  and other healthcare systems.  Past medical history, social, surgical and family history all reviewed in electronic medical record.  No pertanent information unless stated regarding to the chief complaint.   Review of Systems:  No headache, visual changes, nausea, vomiting, diarrhea, constipation, dizziness, abdominal pain, skin rash, fevers, chills, night sweats, weight loss, swollen lymph nodes, body aches, joint swelling, chest pain, shortness of breath, mood changes. POSITIVE muscle aches  Objective  Blood pressure (!) 150/80, pulse 72, height 5\' 9"  (1.753 m), weight 190 lb (86.2 kg), SpO2 97 %.   General: No apparent distress alert and oriented x3 mood and affect normal, dressed appropriately.  HEENT: Pupils equal, extraocular movements intact  Respiratory: Patient's speak in full sentences and does not appear short of breath  Cardiovascular: No lower extremity edema, non tender, no erythema  Gait  Low back exam shows patient does have some loss of lordosis.  Limited range of motion of the right hip noted.  Severely tender to palpation over the greater trochanteric area.  Significant tightness noted with FABER test compared to the contralateral side.  Neurovascularly intact distally.  Does have loss of extension of the back   Procedure: Real-time Ultrasound Guided Injection of right greater trochanteric bursitis secondary to patient's body habitus Device: GE Logiq Q7 Ultrasound guided injection is preferred based studies that show increased duration, increased effect, greater accuracy, decreased procedural pain, increased response rate, and decreased cost with ultrasound guided versus blind injection.  Verbal informed consent obtained.  Time-out conducted.  Noted no overlying erythema, induration, or other signs of local infection.  Skin prepped in a sterile fashion.  Local anesthesia: Topical Ethyl chloride.  With sterile technique and under real time ultrasound guidance:  Greater  trochanteric area was visualized and patient's bursa was noted. A 22-gauge 3 inch needle was inserted and 2 cc of 0.5% Marcaine and 1 cc of Kenalog 40 mg/dL was injected. Pictures taken Completed without difficulty  Pain immediately improved suggesting accurate placement of the medication.  Advised to call if fevers/chills, erythema, induration, drainage, or persistent bleeding.  Impression: Technically successful ultrasound guided injection.   Impression and Recommendations:    The above documentation has been reviewed and is accurate and complete Lyndal Pulley, DO

## 2020-12-25 ENCOUNTER — Encounter: Payer: Self-pay | Admitting: Family Medicine

## 2020-12-25 ENCOUNTER — Ambulatory Visit (INDEPENDENT_AMBULATORY_CARE_PROVIDER_SITE_OTHER): Payer: Medicare Other | Admitting: Family Medicine

## 2020-12-25 ENCOUNTER — Ambulatory Visit: Payer: Self-pay

## 2020-12-25 ENCOUNTER — Other Ambulatory Visit: Payer: Self-pay

## 2020-12-25 ENCOUNTER — Ambulatory Visit (INDEPENDENT_AMBULATORY_CARE_PROVIDER_SITE_OTHER): Payer: Medicare Other

## 2020-12-25 VITALS — BP 150/80 | HR 72 | Ht 69.0 in | Wt 190.0 lb

## 2020-12-25 DIAGNOSIS — G459 Transient cerebral ischemic attack, unspecified: Secondary | ICD-10-CM | POA: Insufficient documentation

## 2020-12-25 DIAGNOSIS — M47816 Spondylosis without myelopathy or radiculopathy, lumbar region: Secondary | ICD-10-CM | POA: Diagnosis not present

## 2020-12-25 DIAGNOSIS — M25559 Pain in unspecified hip: Secondary | ICD-10-CM

## 2020-12-25 DIAGNOSIS — M545 Low back pain, unspecified: Secondary | ICD-10-CM

## 2020-12-25 DIAGNOSIS — M48061 Spinal stenosis, lumbar region without neurogenic claudication: Secondary | ICD-10-CM | POA: Diagnosis not present

## 2020-12-25 DIAGNOSIS — E7800 Pure hypercholesterolemia, unspecified: Secondary | ICD-10-CM | POA: Insufficient documentation

## 2020-12-25 DIAGNOSIS — M7061 Trochanteric bursitis, right hip: Secondary | ICD-10-CM | POA: Diagnosis not present

## 2020-12-25 DIAGNOSIS — M7062 Trochanteric bursitis, left hip: Secondary | ICD-10-CM | POA: Insufficient documentation

## 2020-12-25 DIAGNOSIS — K219 Gastro-esophageal reflux disease without esophagitis: Secondary | ICD-10-CM | POA: Insufficient documentation

## 2020-12-25 DIAGNOSIS — C61 Malignant neoplasm of prostate: Secondary | ICD-10-CM | POA: Insufficient documentation

## 2020-12-25 DIAGNOSIS — G8929 Other chronic pain: Secondary | ICD-10-CM

## 2020-12-25 DIAGNOSIS — M1990 Unspecified osteoarthritis, unspecified site: Secondary | ICD-10-CM | POA: Insufficient documentation

## 2020-12-25 DIAGNOSIS — N5201 Erectile dysfunction due to arterial insufficiency: Secondary | ICD-10-CM | POA: Insufficient documentation

## 2020-12-25 DIAGNOSIS — M1611 Unilateral primary osteoarthritis, right hip: Secondary | ICD-10-CM | POA: Diagnosis not present

## 2020-12-25 DIAGNOSIS — R7303 Prediabetes: Secondary | ICD-10-CM | POA: Insufficient documentation

## 2020-12-25 NOTE — Patient Instructions (Addendum)
Good to see you Back xray and right hip Pennsaid 2 times a day Injection today Take vitamin D 2,000 Ius daily See me again in 6 weeks

## 2020-12-25 NOTE — Assessment & Plan Note (Signed)
Patient given injection today and tolerated the procedure well.  Discussed icing regimen and home exercise, discussed which activities to do which wants to avoid.  Increase activity slowly.  Patient does have some underlying arthritis of the hip that I think is contributing and concern for adjacent segment disease of the lumbar spine.  Getting x-rays today to further evaluate.  Given topical anti-inflammatories otherwise and home exercises.  Follow-up with me again in 4 to 8 weeks.  Worsening pain will need to consider the possibility of advanced imaging with patient already failing physical therapy

## 2021-02-13 NOTE — Progress Notes (Signed)
[place] Phone: [phone] Subjective:   Cory [name], am serving as a scribe for Dr. Hulan Saas.  This visit occurred during the SARS-CoV-2 public health emergency.  Safety protocols were in place, including screening questions prior to the visit, additional usage of staff PPE, and extensive cleaning of exam room while observing appropriate contact time as indicated for disinfecting solutions.   I'm seeing this patient by the request  of:  [name], MD  CC: Right hip pain  [id number]  12/25/2020 Patient given injection today and tolerated the procedure well.  Discussed icing regimen and home exercise, discussed which activities to do which wants to avoid.  Increase activity slowly.  Patient does have some underlying arthritis of the hip that I think is contributing and concern for adjacent segment disease of the lumbar spine.  Getting x-rays today to further evaluate.  Given topical anti-inflammatories otherwise and home exercises.  Follow-up with me again in 4 to 8 weeks.  Worsening pain will need to consider the possibility of advanced imaging with patient already failing physical therapy  Update 02/14/2021 Cory [name] is a 85 y.o. male coming in with complaint of R hip pain. Patient states that his hip feels like it is unstable. Injection lasted maybe 2-3 days. Has had some hamstring muscle spasms since last visit while sitting. Pain will radiate down to the calf.   Pain still seems to be more severe in the groin area.     Past Medical History:  Diagnosis Date   Difficult intubation 02/23/2014   Glidescope for anterior airway   Erythrocytosis    GERD (gastroesophageal reflux disease)    History of kidney stones    Hypertension 01/27/2012   Prostate cancer ([place])    Thalassemia trait    Past Surgical History:  Procedure Laterality Date   BACK SURGERY  2007   CHOLECYSTECTOMY N/A 08/05/2017    Procedure: LAPAROSCOPIC CHOLECYSTECTOMY WITH INTRAOPERATIVE CHOLANGIOGRAM;  Surgeon: Donnie Mesa, MD;  Location: [facility];  Service: General;  Laterality: N/A;   COLONOSCOPY     KNEE ARTHROSCOPY     many yrs ago   LYMPHADENECTOMY Bilateral 02/23/2014   Procedure: PELVIC LYMPH NODE DISSECTION;  Surgeon: Alexis Frock, MD;  Location: [facility];  Service: Urology;  Laterality: Bilateral;   ROBOT ASSISTED LAPAROSCOPIC RADICAL PROSTATECTOMY N/A 02/23/2014   Procedure: ROBOTIC ASSISTED LAPAROSCOPIC RADICAL PROSTATECTOMY WITH INDOCYANINE GREEN DYE INJECTION;  Surgeon: Alexis Frock, MD;  Location: [facility];  Service: Urology;  Laterality: N/A;   Social History   Socioeconomic History   Marital status: Married    Spouse name: Not on file   Number of children: Not on file   Years of education: Not on file   Highest education level: Not on file  Occupational History   Not on file  Tobacco Use   Smoking status: Former    Types: Cigarettes    Quit date: 02/18/1979    Years since quitting: 42.0   Smokeless tobacco: Never  Vaping Use   Vaping Use: Never used  Substance and Sexual Activity   Alcohol use: Yes    Comment: rare   Drug use: [name]   Sexual activity: Not on file  Other Topics Concern   Not on file  Social History Narrative   Not on file   Social Determinants of Health   Financial Resource Strain: Not on file  Food Insecurity: Not on file  Transportation Needs: Not on  file  Physical Activity: Not on file  Stress: Not on file  Social Connections: Not on file   [name] Known Allergies Family History  Problem Relation Age of Onset   Heart attack Mother 64     Current Outpatient Medications (Cardiovascular):    atorvastatin (LIPITOR) 40 MG tablet, Take 40 mg by mouth daily.   benazepril (LOTENSIN) 40 MG tablet, Take 40 mg by mouth daily.   Current Outpatient Medications (Analgesics):    aspirin EC 81 MG tablet, Take 81 mg by mouth daily.   Current Outpatient Medications  (Other):    oxybutynin (DITROPAN-XL) 10 MG 24 hr tablet, Take 10 mg by mouth daily.   Reviewed prior external information including notes and imaging from  primary care provider As well as notes that were available from care everywhere and other healthcare systems.  Past medical history, social, surgical and family history all reviewed in electronic medical record.  [name] pertanent information unless stated regarding to the chief complaint.   Review of Systems:  [name] headache, visual changes, nausea, vomiting, diarrhea, constipation, dizziness, abdominal pain, skin rash, fevers, chills, night sweats, weight loss, swollen lymph nodes, body aches, joint swelling, chest pain, shortness of breath, mood changes. POSITIVE muscle aches  Objective  Blood pressure (!) 142/90, pulse 80, height '5\' 9"'$  (1.753 m), weight 189 lb (85.7 kg), SpO2 98 %.   General: [name] apparent distress alert and oriented x3 mood and affect normal, dressed appropriately.  HEENT: Pupils equal, extraocular movements intact  Respiratory: Patient's speak in full sentences and does not appear short of breath  Cardiovascular: [name] lower extremity edema, non tender, [name] erythema  Gait normal with good balance and coordination.  MSK: Right hip exam does have decrease in internal and external range of motion.  Pain in the groin with internal range of motion.  Negative straight leg test.  Does have loss of lordosis of the lumbar spine.  Procedure: Real-time Ultrasound Guided Injection of right hip Device: GE Logiq Q7  Ultrasound guided injection is preferred based studies that show increased duration, increased effect, greater accuracy, decreased procedural pain, increased response rate with ultrasound guided versus blind injection.  Verbal informed consent obtained.  Time-out conducted.  Noted [name] overlying erythema, induration, or other signs of local infection.  Skin prepped in a sterile fashion.  Local anesthesia: Topical Ethyl chloride.   With sterile technique and under real time ultrasound guidance:  Anterior capsule visualized, needle visualized going to the head neck junction at the anterior capsule. Pictures taken. Patient did have injection of  3 cc of 0.5% Marcaine, and 1 cc of Kenalog 40 mg/dL. Completed without difficulty  Pain immediately resolved suggesting accurate placement of the medication.  Advised to call if fevers/chills, erythema, induration, drainage, or persistent bleeding.  Images permanently stored and available for review in the ultrasound unit.  Impression: Technically successful ultrasound guided injection.    Impression and Recommendations:     The above documentation has been reviewed and is accurate and complete Lyndal Pulley, DO

## 2021-02-14 ENCOUNTER — Other Ambulatory Visit: Payer: Self-pay

## 2021-02-14 ENCOUNTER — Ambulatory Visit (INDEPENDENT_AMBULATORY_CARE_PROVIDER_SITE_OTHER): Payer: Medicare Other | Admitting: Family Medicine

## 2021-02-14 ENCOUNTER — Encounter: Payer: Self-pay | Admitting: Family Medicine

## 2021-02-14 ENCOUNTER — Ambulatory Visit: Payer: Self-pay

## 2021-02-14 VITALS — BP 142/90 | HR 80 | Ht 69.0 in | Wt 189.0 lb

## 2021-02-14 DIAGNOSIS — M1611 Unilateral primary osteoarthritis, right hip: Secondary | ICD-10-CM

## 2021-02-14 DIAGNOSIS — M25551 Pain in right hip: Secondary | ICD-10-CM

## 2021-02-14 NOTE — Patient Instructions (Signed)
See me again in 6 weeks If not better will get MRI

## 2021-02-14 NOTE — Assessment & Plan Note (Signed)
Patient given injection and tolerated the procedure well.  Has significant decrease in inflammation and pain almost immediately he fell.  Patient is able to ambulate a little bit better.  Discussed with patient that this could be short-term.  Due to the severity of the arthritic changes in the hip may need to consider the possibility of surgical intervention especially at patient's age.  Patient is highly active though.  Patient will follow up with me again in 6 to 12 weeks

## 2021-02-16 DIAGNOSIS — N3941 Urge incontinence: Secondary | ICD-10-CM | POA: Diagnosis not present

## 2021-02-16 DIAGNOSIS — R351 Nocturia: Secondary | ICD-10-CM | POA: Diagnosis not present

## 2021-02-19 ENCOUNTER — Inpatient Hospital Stay: Payer: Medicare Other | Attending: Hematology and Oncology

## 2021-02-19 ENCOUNTER — Other Ambulatory Visit: Payer: Self-pay

## 2021-02-19 ENCOUNTER — Inpatient Hospital Stay: Payer: Medicare Other

## 2021-02-19 ENCOUNTER — Other Ambulatory Visit: Payer: Self-pay | Admitting: Hematology and Oncology

## 2021-02-19 VITALS — BP 150/80 | HR 85 | Temp 98.7°F | Resp 18

## 2021-02-19 DIAGNOSIS — D751 Secondary polycythemia: Secondary | ICD-10-CM

## 2021-02-19 LAB — CBC WITH DIFFERENTIAL/PLATELET
Abs Immature Granulocytes: 0.02 10*3/uL (ref 0.00–0.07)
Basophils Absolute: 0 10*3/uL (ref 0.0–0.1)
Basophils Relative: 0 %
Eosinophils Absolute: 0.1 10*3/uL (ref 0.0–0.5)
Eosinophils Relative: 2 %
HCT: 47.1 % (ref 39.0–52.0)
Hemoglobin: 14.5 g/dL (ref 13.0–17.0)
Immature Granulocytes: 0 %
Lymphocytes Relative: 27 %
Lymphs Abs: 1.4 10*3/uL (ref 0.7–4.0)
MCH: 22 pg — ABNORMAL LOW (ref 26.0–34.0)
MCHC: 30.8 g/dL (ref 30.0–36.0)
MCV: 71.5 fL — ABNORMAL LOW (ref 80.0–100.0)
Monocytes Absolute: 0.5 10*3/uL (ref 0.1–1.0)
Monocytes Relative: 9 %
Neutro Abs: 3.1 10*3/uL (ref 1.7–7.7)
Neutrophils Relative %: 62 %
Platelets: 194 10*3/uL (ref 150–400)
RBC: 6.59 MIL/uL — ABNORMAL HIGH (ref 4.22–5.81)
RDW: 19.4 % — ABNORMAL HIGH (ref 11.5–15.5)
WBC: 5 10*3/uL (ref 4.0–10.5)
nRBC: 0 % (ref 0.0–0.2)

## 2021-02-19 NOTE — Patient Instructions (Signed)

## 2021-02-19 NOTE — Progress Notes (Signed)
Cory [name] presents today for phlebotomy per MD orders. Phlebotomy procedure started at 0836 and ended at 0852. 510 grams removed. Patient observed for 20 minutes after procedure without any incident.  Pt refused to stay for the full 30 mins observation. Patient tolerated procedure well. IV needle removed intact.

## 2021-03-22 ENCOUNTER — Encounter (HOSPITAL_COMMUNITY): Payer: Self-pay | Admitting: Emergency Medicine

## 2021-03-22 ENCOUNTER — Emergency Department (HOSPITAL_COMMUNITY)
Admission: EM | Admit: 2021-03-22 | Discharge: 2021-03-23 | Disposition: A | Discharge status: Home / Self Care | Payer: No Typology Code available for payment source | Attending: Emergency Medicine | Admitting: Emergency Medicine

## 2021-03-22 ENCOUNTER — Other Ambulatory Visit: Payer: Self-pay

## 2021-03-22 DIAGNOSIS — Z041 Encounter for examination and observation following transport accident: Secondary | ICD-10-CM | POA: Diagnosis not present

## 2021-03-22 DIAGNOSIS — Z87891 Personal history of nicotine dependence: Secondary | ICD-10-CM | POA: Insufficient documentation

## 2021-03-22 DIAGNOSIS — Z043 Encounter for examination and observation following other accident: Secondary | ICD-10-CM | POA: Insufficient documentation

## 2021-03-22 DIAGNOSIS — S3991XA Unspecified injury of abdomen, initial encounter: Secondary | ICD-10-CM | POA: Diagnosis not present

## 2021-03-22 DIAGNOSIS — Z7982 Long term (current) use of aspirin: Secondary | ICD-10-CM | POA: Diagnosis not present

## 2021-03-22 DIAGNOSIS — N323 Diverticulum of bladder: Secondary | ICD-10-CM | POA: Diagnosis not present

## 2021-03-22 DIAGNOSIS — E041 Nontoxic single thyroid nodule: Secondary | ICD-10-CM

## 2021-03-22 DIAGNOSIS — K449 Diaphragmatic hernia without obstruction or gangrene: Secondary | ICD-10-CM | POA: Diagnosis not present

## 2021-03-22 DIAGNOSIS — N3289 Other specified disorders of bladder: Secondary | ICD-10-CM | POA: Diagnosis not present

## 2021-03-22 DIAGNOSIS — S299XXA Unspecified injury of thorax, initial encounter: Secondary | ICD-10-CM | POA: Diagnosis not present

## 2021-03-22 DIAGNOSIS — Y92410 Unspecified street and highway as the place of occurrence of the external cause: Secondary | ICD-10-CM | POA: Diagnosis not present

## 2021-03-22 DIAGNOSIS — Z8546 Personal history of malignant neoplasm of prostate: Secondary | ICD-10-CM | POA: Diagnosis not present

## 2021-03-22 DIAGNOSIS — M549 Dorsalgia, unspecified: Secondary | ICD-10-CM | POA: Diagnosis not present

## 2021-03-22 DIAGNOSIS — R911 Solitary pulmonary nodule: Secondary | ICD-10-CM | POA: Diagnosis not present

## 2021-03-22 DIAGNOSIS — I10 Essential (primary) hypertension: Secondary | ICD-10-CM | POA: Diagnosis not present

## 2021-03-22 DIAGNOSIS — I2510 Atherosclerotic heart disease of native coronary artery without angina pectoris: Secondary | ICD-10-CM | POA: Diagnosis not present

## 2021-03-22 DIAGNOSIS — Z79899 Other long term (current) drug therapy: Secondary | ICD-10-CM | POA: Diagnosis not present
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
## 2021-03-23 ENCOUNTER — Emergency Department (HOSPITAL_COMMUNITY): Payer: No Typology Code available for payment source

## 2021-03-23 DIAGNOSIS — Z043 Encounter for examination and observation following other accident: Secondary | ICD-10-CM | POA: Diagnosis not present

## 2021-03-23 DIAGNOSIS — I2510 Atherosclerotic heart disease of native coronary artery without angina pectoris: Secondary | ICD-10-CM | POA: Diagnosis not present

## 2021-03-23 DIAGNOSIS — S299XXA Unspecified injury of thorax, initial encounter: Secondary | ICD-10-CM | POA: Diagnosis not present

## 2021-03-23 DIAGNOSIS — Z041 Encounter for examination and observation following transport accident: Secondary | ICD-10-CM | POA: Diagnosis not present

## 2021-03-23 DIAGNOSIS — N3289 Other specified disorders of bladder: Secondary | ICD-10-CM | POA: Diagnosis not present

## 2021-03-23 DIAGNOSIS — K449 Diaphragmatic hernia without obstruction or gangrene: Secondary | ICD-10-CM | POA: Diagnosis not present

## 2021-03-23 DIAGNOSIS — R911 Solitary pulmonary nodule: Secondary | ICD-10-CM | POA: Diagnosis not present

## 2021-03-23 DIAGNOSIS — M549 Dorsalgia, unspecified: Secondary | ICD-10-CM | POA: Diagnosis not present

## 2021-03-23 DIAGNOSIS — S3991XA Unspecified injury of abdomen, initial encounter: Secondary | ICD-10-CM | POA: Diagnosis not present

## 2021-03-23 DIAGNOSIS — N323 Diverticulum of bladder: Secondary | ICD-10-CM | POA: Diagnosis not present

## 2021-03-23 LAB — I-STAT CHEM 8, ED
BUN: 15 mg/dL (ref 8–23)
Calcium, Ion: 1.12 mmol/L — ABNORMAL LOW (ref 1.15–1.40)
Chloride: 105 mmol/L (ref 98–111)
Creatinine, Ser: 1 mg/dL (ref 0.61–1.24)
Glucose, Bld: 104 mg/dL — ABNORMAL HIGH (ref 70–99)
HCT: 52 % (ref 39.0–52.0)
Hemoglobin: 17.7 g/dL — ABNORMAL HIGH (ref 13.0–17.0)
Potassium: 4.2 mmol/L (ref 3.5–5.1)
Sodium: 141 mmol/L (ref 135–145)
TCO2: 26 mmol/L (ref 22–32)

## 2021-03-23 LAB — CBC WITH DIFFERENTIAL/PLATELET
Abs Immature Granulocytes: 0.02 10*3/uL (ref 0.00–0.07)
Basophils Absolute: 0 10*3/uL (ref 0.0–0.1)
Basophils Relative: 0 %
Eosinophils Absolute: 0 10*3/uL (ref 0.0–0.5)
Eosinophils Relative: 0 %
HCT: 51.2 % (ref 39.0–52.0)
Hemoglobin: 15.4 g/dL (ref 13.0–17.0)
Immature Granulocytes: 0 %
Lymphocytes Relative: 18 %
Lymphs Abs: 1.5 10*3/uL (ref 0.7–4.0)
MCH: 21.9 pg — ABNORMAL LOW (ref 26.0–34.0)
MCHC: 30.1 g/dL (ref 30.0–36.0)
MCV: 72.9 fL — ABNORMAL LOW (ref 80.0–100.0)
Monocytes Absolute: 0.8 10*3/uL (ref 0.1–1.0)
Monocytes Relative: 10 %
Neutro Abs: 5.7 10*3/uL (ref 1.7–7.7)
Neutrophils Relative %: 72 %
Platelets: 204 10*3/uL (ref 150–400)
RBC: 7.02 MIL/uL — ABNORMAL HIGH (ref 4.22–5.81)
RDW: 19.2 % — ABNORMAL HIGH (ref 11.5–15.5)
WBC: 8.1 10*3/uL (ref 4.0–10.5)
nRBC: 0 % (ref 0.0–0.2)

## 2021-03-23 MED ORDER — KETOROLAC TROMETHAMINE 30 MG/ML IJ SOLN
30.0000 mg | Freq: Once | INTRAMUSCULAR | Status: AC
  Administered 2021-03-23: 30 mg via INTRAVENOUS
  Filled 2021-03-23: qty 1

## 2021-03-23 MED ORDER — DICLOFENAC SODIUM 1 % EX GEL: 4.0000 g | Freq: Four times a day (QID) | CUTANEOUS | 0 refills | Status: DC

## 2021-03-23 MED ORDER — ACETAMINOPHEN 500 MG PO TABS
1000.0000 mg | ORAL_TABLET | Freq: Once | ORAL | Status: AC
  Administered 2021-03-23: 1000 mg via ORAL
  Filled 2021-03-23: qty 2

## 2021-03-23 MED ORDER — IOHEXOL 350 MG/ML SOLN
100.0000 mL | Freq: Once | INTRAVENOUS | Status: AC | PRN
  Administered 2021-03-23: 100 mL via INTRAVENOUS

## 2021-03-23 MED ORDER — LIDOCAINE 5 % EX PTCH: 1.0000 | MEDICATED_PATCH | CUTANEOUS | 0 refills | Status: DC
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
## 2021-03-28 ENCOUNTER — Ambulatory Visit (INDEPENDENT_AMBULATORY_CARE_PROVIDER_SITE_OTHER): Payer: Medicare Other | Admitting: Family Medicine

## 2021-03-28 ENCOUNTER — Encounter: Payer: Self-pay | Admitting: Family Medicine

## 2021-03-28 ENCOUNTER — Other Ambulatory Visit: Payer: Self-pay

## 2021-03-28 ENCOUNTER — Ambulatory Visit (INDEPENDENT_AMBULATORY_CARE_PROVIDER_SITE_OTHER): Payer: Medicare Other

## 2021-03-28 VITALS — BP 170/84 | HR 97 | Ht 68.0 in | Wt 187.0 lb

## 2021-03-28 DIAGNOSIS — M1611 Unilateral primary osteoarthritis, right hip: Secondary | ICD-10-CM | POA: Diagnosis not present

## 2021-03-28 DIAGNOSIS — M545 Low back pain, unspecified: Secondary | ICD-10-CM | POA: Diagnosis not present

## 2021-03-28 DIAGNOSIS — S134XXA Sprain of ligaments of cervical spine, initial encounter: Secondary | ICD-10-CM | POA: Insufficient documentation

## 2021-03-28 NOTE — Assessment & Plan Note (Signed)
Patient has responded extremely well to the injection.  Patient is improving with range of motion as well as being able to do more activities.  Discussed with patient about icing regimen and home exercises.  Patient will continue to monitor.  Discussed with patient that there is always a chance for the possibility of a replacement but patient wants to avoid that if possible.  Follow-up with me again in 6 weeks.
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
## 2021-03-28 NOTE — Patient Instructions (Addendum)
Xray today Do prescribed exercises at least 3x a week Glad the hips doing much better See you again in 4-6 weeks Call if any worsening symptoms

## 2021-03-30 ENCOUNTER — Telehealth: Payer: Self-pay | Admitting: Hematology and Oncology

## 2021-03-30 NOTE — Telephone Encounter (Signed)
Per 9/30 schedule msg, appt was rescheduled and msg was left with patient

## 2021-04-09 ENCOUNTER — Encounter: Payer: Self-pay | Admitting: Hematology and Oncology

## 2021-04-20 ENCOUNTER — Ambulatory Visit: Payer: Medicare Other

## 2021-04-20 ENCOUNTER — Inpatient Hospital Stay: Payer: Medicare Other | Attending: Hematology and Oncology

## 2021-04-20 DIAGNOSIS — D751 Secondary polycythemia: Secondary | ICD-10-CM | POA: Insufficient documentation

## 2021-04-24 DIAGNOSIS — D751 Secondary polycythemia: Secondary | ICD-10-CM | POA: Diagnosis not present

## 2021-04-24 DIAGNOSIS — Z0000 Encounter for general adult medical examination without abnormal findings: Secondary | ICD-10-CM | POA: Diagnosis not present

## 2021-04-24 DIAGNOSIS — R7303 Prediabetes: Secondary | ICD-10-CM | POA: Diagnosis not present

## 2021-04-24 DIAGNOSIS — E041 Nontoxic single thyroid nodule: Secondary | ICD-10-CM | POA: Diagnosis not present

## 2021-04-24 DIAGNOSIS — G459 Transient cerebral ischemic attack, unspecified: Secondary | ICD-10-CM | POA: Diagnosis not present

## 2021-04-24 DIAGNOSIS — N3941 Urge incontinence: Secondary | ICD-10-CM | POA: Diagnosis not present

## 2021-04-24 DIAGNOSIS — I10 Essential (primary) hypertension: Secondary | ICD-10-CM | POA: Diagnosis not present

## 2021-04-24 DIAGNOSIS — N5201 Erectile dysfunction due to arterial insufficiency: Secondary | ICD-10-CM | POA: Diagnosis not present

## 2021-04-24 DIAGNOSIS — C61 Malignant neoplasm of prostate: Secondary | ICD-10-CM | POA: Diagnosis not present

## 2021-04-24 DIAGNOSIS — K219 Gastro-esophageal reflux disease without esophagitis: Secondary | ICD-10-CM | POA: Diagnosis not present

## 2021-04-24 DIAGNOSIS — E7800 Pure hypercholesterolemia, unspecified: Secondary | ICD-10-CM | POA: Diagnosis not present

## 2021-04-24 DIAGNOSIS — Z1389 Encounter for screening for other disorder: Secondary | ICD-10-CM | POA: Diagnosis not present

## 2021-04-27 ENCOUNTER — Other Ambulatory Visit: Payer: Self-pay

## 2021-04-27 ENCOUNTER — Inpatient Hospital Stay: Payer: Medicare Other

## 2021-04-27 VITALS — BP 128/74 | HR 74 | Temp 98.6°F | Resp 16

## 2021-04-27 DIAGNOSIS — E041 Nontoxic single thyroid nodule: Secondary | ICD-10-CM | POA: Diagnosis not present

## 2021-04-27 DIAGNOSIS — D751 Secondary polycythemia: Secondary | ICD-10-CM | POA: Diagnosis not present

## 2021-04-27 LAB — CBC WITH DIFFERENTIAL/PLATELET
Abs Immature Granulocytes: 0.01 10*3/uL (ref 0.00–0.07)
Basophils Absolute: 0 10*3/uL (ref 0.0–0.1)
Basophils Relative: 0 %
Eosinophils Absolute: 0.1 10*3/uL (ref 0.0–0.5)
Eosinophils Relative: 1 %
HCT: 49.4 % (ref 39.0–52.0)
Hemoglobin: 15.1 g/dL (ref 13.0–17.0)
Immature Granulocytes: 0 %
Lymphocytes Relative: 39 %
Lymphs Abs: 1.6 10*3/uL (ref 0.7–4.0)
MCH: 21.9 pg — ABNORMAL LOW (ref 26.0–34.0)
MCHC: 30.6 g/dL (ref 30.0–36.0)
MCV: 71.7 fL — ABNORMAL LOW (ref 80.0–100.0)
Monocytes Absolute: 0.4 10*3/uL (ref 0.1–1.0)
Monocytes Relative: 10 %
Neutro Abs: 2 10*3/uL (ref 1.7–7.7)
Neutrophils Relative %: 50 %
Platelets: 173 10*3/uL (ref 150–400)
RBC: 6.89 MIL/uL — ABNORMAL HIGH (ref 4.22–5.81)
RDW: 20.2 % — ABNORMAL HIGH (ref 11.5–15.5)
WBC: 4.1 10*3/uL (ref 4.0–10.5)
nRBC: 0 % (ref 0.0–0.2)

## 2021-04-27 NOTE — Patient Instructions (Signed)

## 2021-04-27 NOTE — Progress Notes (Signed)
Cory [name] presents today for phlebotomy per MD orders. Phlebotomy procedure started at 0856 and ended at 0905. 497 grams removed. Patient observed for 15 minutes after procedure without any incident. He refused to wait longer. Patient tolerated procedure well. IV needle removed intact. Vitals signs WNL.

## 2021-05-03 ENCOUNTER — Other Ambulatory Visit: Payer: Self-pay | Admitting: Family Medicine

## 2021-05-03 DIAGNOSIS — E041 Nontoxic single thyroid nodule: Secondary | ICD-10-CM
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
## 2021-05-09 ENCOUNTER — Ambulatory Visit: Payer: Self-pay

## 2021-05-09 ENCOUNTER — Ambulatory Visit (INDEPENDENT_AMBULATORY_CARE_PROVIDER_SITE_OTHER): Payer: Medicare Other

## 2021-05-09 ENCOUNTER — Ambulatory Visit (INDEPENDENT_AMBULATORY_CARE_PROVIDER_SITE_OTHER): Payer: Medicare Other | Admitting: Family Medicine

## 2021-05-09 ENCOUNTER — Encounter: Payer: Self-pay | Admitting: Family Medicine

## 2021-05-09 ENCOUNTER — Other Ambulatory Visit: Payer: Self-pay

## 2021-05-09 VITALS — BP 144/92 | HR 85 | Ht 68.0 in | Wt 189.0 lb

## 2021-05-09 DIAGNOSIS — M25551 Pain in right hip: Secondary | ICD-10-CM

## 2021-05-09 DIAGNOSIS — M19011 Primary osteoarthritis, right shoulder: Secondary | ICD-10-CM | POA: Diagnosis not present

## 2021-05-09 DIAGNOSIS — M25511 Pain in right shoulder: Secondary | ICD-10-CM

## 2021-05-09 DIAGNOSIS — M542 Cervicalgia: Secondary | ICD-10-CM

## 2021-05-09 DIAGNOSIS — M5030 Other cervical disc degeneration, unspecified cervical region: Secondary | ICD-10-CM

## 2021-05-09 DIAGNOSIS — M5416 Radiculopathy, lumbar region: Secondary | ICD-10-CM | POA: Diagnosis not present

## 2021-05-09 DIAGNOSIS — M19019 Primary osteoarthritis, unspecified shoulder: Secondary | ICD-10-CM | POA: Insufficient documentation

## 2021-05-09 NOTE — Assessment & Plan Note (Signed)
Arthritis noted of the shoulder.  Discussed with patient about icing regimen and home exercises.  Discussed which activities to doing which wants to avoid.  Do believe that patient's arm pain is more secondary to cervical spinal stenosis and will have the epidural to see if this will be more beneficial.  Have him follow-up with me again in 4 to 8 weeks

## 2021-05-09 NOTE — Patient Instructions (Addendum)
MRI lumbar [id number] Epidural same as MRI phone number Xray today Worsening pain seek medical attention I"ll write to you with results

## 2021-05-09 NOTE — Assessment & Plan Note (Signed)
Patient does have arthritis of the hip but seems to be doing relatively well at the moment.  Patient still wants to avoid any type of surgical intervention with a head at the moment and I am concerned the patient is having more of a lumbar radiculopathy as well.  We will get an MRI of the lumbar spine.  Past medical history is also significant for malignant prostate cancer so we do need to monitor.  Patient has been relatively healthy though other than these aches and pains that are though affecting daily activities and even waking him up at night.  Patient will follow up with me again after the MRI to discuss the possibility of epidural steroid injections.  Follow-up with me again as we state after the imaging.

## 2021-05-09 NOTE — Assessment & Plan Note (Signed)
Degenerative disc disease of the cervical spine.  Discussed with patient that there has been some limited range of motion in sidebending as well as extension.  Patient does have a positive Spurling's today.  CT scan did show that patient did have a moderate to severe spinal stenosis.  We will attempt a C7-T1 epidural in hopes that this will make significant improvement.  Encourage patient to stay active where possible.  Ultrasound of patient's shoulder today does show some acromioclavicular arthritis but rotator cuff has some degenerative changes but nothing severe.  Follow-up with me again in 5 to 6 weeks after the injection.

## 2021-05-16 ENCOUNTER — Inpatient Hospital Stay: Admission: RE | Admit: 2021-05-16 | Payer: Medicare Other | Source: Ambulatory Visit

## 2021-05-20 ENCOUNTER — Other Ambulatory Visit: Payer: Self-pay

## 2021-05-20 ENCOUNTER — Ambulatory Visit
Admission: RE | Admit: 2021-05-20 | Discharge: 2021-05-20 | Disposition: A | Discharge status: Home / Self Care | Payer: Medicare Other | Source: Ambulatory Visit | Attending: Family Medicine | Admitting: Family Medicine

## 2021-05-20 DIAGNOSIS — M5416 Radiculopathy, lumbar region: Secondary | ICD-10-CM | POA: Diagnosis not present

## 2021-05-20 DIAGNOSIS — M48061 Spinal stenosis, lumbar region without neurogenic claudication: Secondary | ICD-10-CM | POA: Diagnosis not present

## 2021-05-20 DIAGNOSIS — M2578 Osteophyte, vertebrae: Secondary | ICD-10-CM | POA: Diagnosis not present

## 2021-05-22 ENCOUNTER — Ambulatory Visit
Admission: RE | Admit: 2021-05-22 | Discharge: 2021-05-22 | Disposition: A | Discharge status: Home / Self Care | Payer: Medicare Other | Source: Ambulatory Visit | Attending: Family Medicine | Admitting: Family Medicine

## 2021-05-22 ENCOUNTER — Other Ambulatory Visit: Payer: Self-pay

## 2021-05-22 ENCOUNTER — Encounter: Payer: Self-pay | Admitting: Hematology and Oncology

## 2021-05-22 DIAGNOSIS — M542 Cervicalgia: Secondary | ICD-10-CM

## 2021-05-22 DIAGNOSIS — M5412 Radiculopathy, cervical region: Secondary | ICD-10-CM | POA: Diagnosis not present

## 2021-05-22 MED ORDER — TRIAMCINOLONE ACETONIDE 40 MG/ML IJ SUSP (RADIOLOGY)
60.0000 mg | Freq: Once | INTRAMUSCULAR | Status: AC
  Administered 2021-05-22: 60 mg via EPIDURAL

## 2021-05-22 MED ORDER — IOPAMIDOL (ISOVUE-M 300) INJECTION 61%
1.0000 mL | Freq: Once | INTRAMUSCULAR | Status: AC | PRN
  Administered 2021-05-22: 1 mL via EPIDURAL

## 2021-05-22 NOTE — Discharge Instructions (Signed)

## 2021-05-31 ENCOUNTER — Other Ambulatory Visit (HOSPITAL_COMMUNITY)
Admission: RE | Admit: 2021-05-31 | Discharge: 2021-05-31 | Disposition: A | Discharge status: Home / Self Care | Payer: Medicare Other | Source: Ambulatory Visit | Attending: Physician Assistant | Admitting: Physician Assistant

## 2021-05-31 ENCOUNTER — Ambulatory Visit
Admission: RE | Admit: 2021-05-31 | Discharge: 2021-05-31 | Disposition: A | Discharge status: Home / Self Care | Payer: Medicare Other | Source: Ambulatory Visit | Attending: Family Medicine | Admitting: Family Medicine

## 2021-05-31 DIAGNOSIS — E041 Nontoxic single thyroid nodule: Secondary | ICD-10-CM

## 2021-06-01 LAB — CYTOLOGY - NON PAP

## 2021-06-11 NOTE — Progress Notes (Signed)
[place] Phone: [phone] Subjective:   Fontaine No, am serving as a scribe for Dr. Hulan Saas. This visit occurred during the SARS-CoV-2 public health emergency.  Safety protocols were in place, including screening questions prior to the visit, additional usage of staff PPE, and extensive cleaning of exam room while observing appropriate contact time as indicated for disinfecting solutions.   I'm seeing this patient by the request  of:  [name], MD  CC: low back pain follow up, neck pain follow up   [id number]  Marcello Tuzzolino is a 85 y.o. male coming in with complaint of cervical and low back pain. MRI in 11/22 showed spinal stenosis at L3/4 and arthritic changes but overall stable.  Feels like R hip is giving out more lately.   Did not feel that epidural did not help with cervical radiculopathy.  Neck exam severe OA and had C7/T1 epidural 11/22 patient is significant anterolateral noted.   Had a FNA of the thyroid nodule on 12/1- path showed benign follicular nodule     Past Medical History:  Diagnosis Date   Difficult intubation 02/23/2014   Glidescope for anterior airway   Erythrocytosis    GERD (gastroesophageal reflux disease)    History of kidney stones    Hypertension 01/27/2012   Prostate cancer ([place])    Thalassemia trait    Past Surgical History:  Procedure Laterality Date   BACK SURGERY  2007   CHOLECYSTECTOMY N/A 08/05/2017   Procedure: LAPAROSCOPIC CHOLECYSTECTOMY WITH INTRAOPERATIVE CHOLANGIOGRAM;  Surgeon: Donnie Mesa, MD;  Location: [facility];  Service: General;  Laterality: N/A;   COLONOSCOPY     KNEE ARTHROSCOPY     many yrs ago   LYMPHADENECTOMY Bilateral 02/23/2014   Procedure: PELVIC LYMPH NODE DISSECTION;  Surgeon: Alexis Frock, MD;  [facility];  Service: Urology;  Laterality: Bilateral;   ROBOT ASSISTED LAPAROSCOPIC RADICAL PROSTATECTOMY N/A 02/23/2014    Procedure: ROBOTIC ASSISTED LAPAROSCOPIC RADICAL PROSTATECTOMY WITH INDOCYANINE GREEN DYE INJECTION;  Surgeon: Alexis Frock, MD;  [facility];  Service: Urology;  Laterality: N/A;   Social History   Socioeconomic History   Marital status: Married    Spouse name: Not on file   Number of children: Not on file   Years of education: Not on file   Highest education level: Not on file  Occupational History   Not on file  Tobacco Use   Smoking status: Former    Types: Cigarettes    Quit date: 02/18/1979    Years since quitting: 42.3   Smokeless tobacco: Never  Vaping Use   Vaping Use: Never used  Substance and Sexual Activity   Alcohol use: Yes    Comment: rare   Drug use: No   Sexual activity: Not on file  Other Topics Concern   Not on file  Social History Narrative   Not on file   Social Determinants of Health   Financial Resource Strain: Not on file  Food Insecurity: Not on file  Transportation Needs: Not on file  Physical Activity: Not on file  Stress: Not on file  Social Connections: Not on file   No Known Allergies Family History  Problem Relation Age of Onset   Heart attack Mother 52     Current Outpatient Medications (Cardiovascular):    atorvastatin (LIPITOR) 40 MG tablet, Take 40 mg by mouth daily.   benazepril (LOTENSIN) 40 MG tablet, Take 40 mg by mouth  daily.   Current Outpatient Medications (Analgesics):    aspirin EC 81 MG tablet, Take 81 mg by mouth daily.   Current Outpatient Medications (Other):    diclofenac Sodium (VOLTAREN) 1 % GEL, Apply 4 g topically 4 (four) times daily.   lidocaine (LIDODERM) 5 %, Place 1 patch onto the skin daily. Remove & Discard patch within 12 hours or as directed by MD   oxybutynin (DITROPAN-XL) 10 MG 24 hr tablet, Take 10 mg by mouth daily.   CIPRO 250 MG tablet, Take 250 mg by mouth 2 (two) times daily.   Reviewed prior external information including notes and imaging from  primary care provider As well as  notes that were available from care everywhere and other healthcare systems.  Past medical history, social, surgical and family history all reviewed in electronic medical record.  No pertanent information unless stated regarding to the chief complaint.   Review of Systems:  No headache, visual changes, nausea, vomiting, diarrhea, constipation, dizziness, abdominal pain, skin rash, fevers, chills, night sweats, weight loss, swollen lymph nodes,  joint swelling, chest pain, shortness of breath, mood changes. POSITIVE muscle aches, body  Objective  Blood pressure 132/84, pulse (!) 109, height 5\' 8"  (1.727 m), weight 190 lb (86.2 kg), SpO2 99 %.   General: No apparent distress alert and oriented x3 mood and affect normal, dressed appropriately.  HEENT: Pupils equal, extraocular movements intact  Respiratory: Patient's speak in full sentences and does not appear short of breath  Cardiovascular: No lower extremity edema, non tender, no erythema  Gait  mild antalgic gait Neck exam does have some loss of lordosis.  Patient does have some limited sidebending bilaterally.  Right shoulder does have some positive impingement Back exam shows some mild loss of lordosis.  Tenderness to palpation of the greater trochanteric area. After informed written and verbal consent, patient was seated on exam table. Right shoulder was prepped with alcohol swab and utilizing posterior approach, patient's right glenohumeral space was injected with 4:1  marcaine 0.5%: Kenalog 40mg /dL. Patient tolerated the procedure well without immediate complications.  After verbal consent patient was prepped in sterile fashion with alcohol swabs. Ethyl chloride used patient was injected with a 22-gauge 3 inch needle into the RIGHT lateral hip in the greater trochanteric area under ultrasound guidance. Picture was taken. Patient had 4 cc of 0.5% Marcaine and 1 cc of Kenalog 40 mg/dL injected. Patient tolerated the procedure well and no blood  loss. Pain completely resolved after injection stating proper placement. Post injection instructions given.    Impression and Recommendations:     The above documentation has been reviewed and is accurate and complete Lyndal Pulley, DO

## 2021-06-12 ENCOUNTER — Other Ambulatory Visit: Payer: Self-pay

## 2021-06-12 ENCOUNTER — Ambulatory Visit (INDEPENDENT_AMBULATORY_CARE_PROVIDER_SITE_OTHER): Payer: Medicare Other

## 2021-06-12 ENCOUNTER — Ambulatory Visit (INDEPENDENT_AMBULATORY_CARE_PROVIDER_SITE_OTHER): Payer: Medicare Other | Admitting: Family Medicine

## 2021-06-12 ENCOUNTER — Encounter: Payer: Self-pay | Admitting: Family Medicine

## 2021-06-12 VITALS — BP 132/84 | HR 109 | Ht 68.0 in | Wt 190.0 lb

## 2021-06-12 DIAGNOSIS — M25551 Pain in right hip: Secondary | ICD-10-CM

## 2021-06-12 DIAGNOSIS — M25511 Pain in right shoulder: Secondary | ICD-10-CM

## 2021-06-12 DIAGNOSIS — G8929 Other chronic pain: Secondary | ICD-10-CM

## 2021-06-12 DIAGNOSIS — M7061 Trochanteric bursitis, right hip: Secondary | ICD-10-CM

## 2021-06-12 NOTE — Assessment & Plan Note (Signed)
Significant arthritic changes noted [place].  He does have some limited range of motion.  Patient did respond well to the injection today.  Chronic problem with exacerbation.  Increase activity slowly.  Follow-up again in 6 weeks.  Differential still includes the cervical radiculopathy.  The patient did not respond well to the epidural.

## 2021-06-12 NOTE — Patient Instructions (Signed)
GT and shoulder injection Send message and will do EMG R hip xray [facility] See me in 6 weeks

## 2021-06-12 NOTE — Assessment & Plan Note (Signed)
Repeat injection given today.  We will get x-rays to further evaluate hip for amount of arthritic changes that could be contributing.  Discussed icing regimen and home exercises.  Discussed core strengthening and stability exercises.  Follow-up again in 4 to 8 weeks.

## 2021-06-20 ENCOUNTER — Other Ambulatory Visit: Payer: Medicare Other

## 2021-06-20 ENCOUNTER — Other Ambulatory Visit: Payer: Self-pay

## 2021-06-20 ENCOUNTER — Inpatient Hospital Stay: Payer: Medicare Other

## 2021-06-20 ENCOUNTER — Inpatient Hospital Stay: Payer: Medicare Other | Attending: Hematology and Oncology

## 2021-06-20 DIAGNOSIS — D751 Secondary polycythemia: Secondary | ICD-10-CM | POA: Insufficient documentation

## 2021-06-20 LAB — CBC WITH DIFFERENTIAL/PLATELET
Abs Immature Granulocytes: 0.02 10*3/uL (ref 0.00–0.07)
Basophils Absolute: 0 10*3/uL (ref 0.0–0.1)
Basophils Relative: 1 %
Eosinophils Absolute: 0.1 10*3/uL (ref 0.0–0.5)
Eosinophils Relative: 1 %
HCT: 47.1 % (ref 39.0–52.0)
Hemoglobin: 14.6 g/dL (ref 13.0–17.0)
Immature Granulocytes: 1 %
Lymphocytes Relative: 32 %
Lymphs Abs: 1.4 10*3/uL (ref 0.7–4.0)
MCH: 22.6 pg — ABNORMAL LOW (ref 26.0–34.0)
MCHC: 31 g/dL (ref 30.0–36.0)
MCV: 73 fL — ABNORMAL LOW (ref 80.0–100.0)
Monocytes Absolute: 0.5 10*3/uL (ref 0.1–1.0)
Monocytes Relative: 12 %
Neutro Abs: 2.4 10*3/uL (ref 1.7–7.7)
Neutrophils Relative %: 53 %
Platelets: 175 10*3/uL (ref 150–400)
RBC: 6.45 MIL/uL — ABNORMAL HIGH (ref 4.22–5.81)
RDW: 20.7 % — ABNORMAL HIGH (ref 11.5–15.5)
WBC: 4.4 10*3/uL (ref 4.0–10.5)
nRBC: 0 % (ref 0.0–0.2)

## 2021-07-19 NOTE — Progress Notes (Signed)
Cory [name] [place] [address] [place] Phone: [phone] Subjective:   Cory [name], am serving as a scribe for Dr. Hulan [name]. This visit occurred during the SARS-CoV-2 public health emergency.  Safety protocols were in place, including screening questions prior to the visit, additional usage of staff PPE, and extensive cleaning of exam room while observing appropriate contact time as indicated for disinfecting solutions.   I'm seeing this patient by the request  of:  Cory Pillar, MD  CC: right hip and right shoulder pain   ZWC:[id number]  06/12/2021 Significant arthritic changes noted [place].  He does have some limited range of motion.  Patient did respond well to the injection today.  Chronic problem with exacerbation.  Increase activity slowly.  Follow-up again in 6 weeks.  Differential still includes the cervical radiculopathy.  The patient did not respond well to the epidural.  Update 07/24/2021 Cory [name] is a 86 y.o. male coming in with complaint of R hip pain and R shoulder pain. Patient states doing better. No problems with hip and shoulder feels much better. Less tingling. No new complaints   New hip xrays show stable moderate OA   Past Medical History:  Diagnosis Date   Difficult intubation 02/23/2014   Glidescope for anterior airway   Erythrocytosis    GERD (gastroesophageal reflux disease)    History of kidney stones    Hypertension 01/27/2012   Prostate cancer ([place])    Thalassemia trait    Past Surgical History:  Procedure Laterality Date   BACK SURGERY  2007   CHOLECYSTECTOMY N/A 08/05/2017   Procedure: LAPAROSCOPIC CHOLECYSTECTOMY WITH INTRAOPERATIVE CHOLANGIOGRAM;  Surgeon: Cory Mesa, MD;  Location: [facility];  Service: General;  Laterality: N/A;   COLONOSCOPY     KNEE ARTHROSCOPY     many yrs ago   LYMPHADENECTOMY Bilateral 02/23/2014   Procedure: PELVIC LYMPH NODE DISSECTION;  Surgeon: Cory Frock,  MD;  Location: [facility];  Service: Urology;  Laterality: Bilateral;   ROBOT ASSISTED LAPAROSCOPIC RADICAL PROSTATECTOMY N/A 02/23/2014   Procedure: ROBOTIC ASSISTED LAPAROSCOPIC RADICAL PROSTATECTOMY WITH INDOCYANINE GREEN DYE INJECTION;  Surgeon: Cory Frock, MD;  Location: [facility];  Service: Urology;  Laterality: N/A;   Social History   Socioeconomic History   Marital status: Married    Spouse name: Not on file   Number of children: Not on file   Years of education: Not on file   Highest education level: Not on file  Occupational History   Not on file  Tobacco Use   Smoking status: Former    Types: Cigarettes    Quit date: 02/18/1979    Years since quitting: 42.4   Smokeless tobacco: Never  Vaping Use   Vaping Use: Never used  Substance and Sexual Activity   Alcohol use: Yes    Comment: rare   Drug use: No   Sexual activity: Not on file  Other Topics Concern   Not on file  Social History Narrative   Not on file   Social Determinants of Health   Financial Resource Strain: Not on file  Food Insecurity: Not on file  Transportation Needs: Not on file  Physical Activity: Not on file  Stress: Not on file  Social Connections: Not on file   No Known Allergies Family History  Problem Relation Age of Onset   Heart attack Mother 44     Current Outpatient Medications (Cardiovascular):    atorvastatin (LIPITOR) 40 MG tablet, Take 40 mg  by mouth daily.   benazepril (LOTENSIN) 40 MG tablet, Take 40 mg by mouth daily.   Current Outpatient Medications (Analgesics):    aspirin EC 81 MG tablet, Take 81 mg by mouth daily.   Current Outpatient Medications (Other):    CIPRO 250 MG tablet, Take 250 mg by mouth 2 (two) times daily.   diclofenac Sodium (VOLTAREN) 1 % GEL, Apply 4 g topically 4 (four) times daily.   lidocaine (LIDODERM) 5 %, Place 1 patch onto the skin daily. Remove & Discard patch within 12 hours or as directed by MD   oxybutynin (DITROPAN-XL) 10 MG 24 hr tablet,  Take 10 mg by mouth daily.   Reviewed prior external information including notes and imaging from   Objective  Blood pressure (!) 150/88, pulse 85, height 5\' 8"  (1.727 m), weight 191 lb (86.6 kg), SpO2 98 %.   General: No apparent distress alert and oriented x3 mood and affect normal, dressed appropriately.  HEENT: Pupils equal, extraocular movements intact  Respiratory: Patient's speak in full sentences and does not appear short of breath  Cardiovascular: No lower extremity edema, non tender, no erythema  Gait antalgic  MSK: Patient's right hip does have some limited range of motion but overall seems to be doing relatively well.  Patient's right shoulder improvement in range of motion but does have 4-5 strength of the rotator cuff.  Mild crepitus noted and does have positive crossover.    Impression and Recommendations:     The above documentation has been reviewed and is accurate and complete Cory Pulley, DO

## 2021-07-24 ENCOUNTER — Ambulatory Visit (INDEPENDENT_AMBULATORY_CARE_PROVIDER_SITE_OTHER): Payer: Medicare Other | Admitting: Family Medicine

## 2021-07-24 ENCOUNTER — Other Ambulatory Visit: Payer: Self-pay

## 2021-07-24 DIAGNOSIS — M1611 Unilateral primary osteoarthritis, right hip: Secondary | ICD-10-CM | POA: Diagnosis not present

## 2021-07-24 DIAGNOSIS — M25511 Pain in right shoulder: Secondary | ICD-10-CM | POA: Diagnosis not present

## 2021-07-24 DIAGNOSIS — G8929 Other chronic pain: Secondary | ICD-10-CM | POA: Diagnosis not present

## 2021-07-24 NOTE — Patient Instructions (Signed)
Good to see you! Glad you're doing really good See you again in 3 months

## 2021-07-24 NOTE — Assessment & Plan Note (Signed)
Patient significantly better at this time.  Not having any significant pain that is stopping him from activity.  We will continue to monitor.  Follow-up again in 3 months.

## 2021-07-24 NOTE — Assessment & Plan Note (Signed)
Stable at the moment.  Discussed we will potentially consider another injection if necessary.  Patient has not had any significant worsening of arthritis on x-ray.  Patient is feeling better.  Follow-up again in 3 months

## 2021-08-21 ENCOUNTER — Inpatient Hospital Stay: Payer: Medicare Other

## 2021-08-21 ENCOUNTER — Other Ambulatory Visit: Payer: Self-pay

## 2021-08-21 ENCOUNTER — Inpatient Hospital Stay: Payer: Medicare Other | Attending: Hematology and Oncology

## 2021-08-21 DIAGNOSIS — D751 Secondary polycythemia: Secondary | ICD-10-CM | POA: Insufficient documentation

## 2021-08-21 LAB — CBC WITH DIFFERENTIAL/PLATELET
Abs Immature Granulocytes: 0.01 10*3/uL (ref 0.00–0.07)
Basophils Absolute: 0 10*3/uL (ref 0.0–0.1)
Basophils Relative: 1 %
Eosinophils Absolute: 0.1 10*3/uL (ref 0.0–0.5)
Eosinophils Relative: 2 %
HCT: 46.6 % (ref 39.0–52.0)
Hemoglobin: 14.2 g/dL (ref 13.0–17.0)
Immature Granulocytes: 0 %
Lymphocytes Relative: 44 %
Lymphs Abs: 1.8 10*3/uL (ref 0.7–4.0)
MCH: 21.8 pg — ABNORMAL LOW (ref 26.0–34.0)
MCHC: 30.5 g/dL (ref 30.0–36.0)
MCV: 71.5 fL — ABNORMAL LOW (ref 80.0–100.0)
Monocytes Absolute: 0.5 10*3/uL (ref 0.1–1.0)
Monocytes Relative: 11 %
Neutro Abs: 1.7 10*3/uL (ref 1.7–7.7)
Neutrophils Relative %: 42 %
Platelets: 191 10*3/uL (ref 150–400)
RBC: 6.52 MIL/uL — ABNORMAL HIGH (ref 4.22–5.81)
RDW: 18.6 % — ABNORMAL HIGH (ref 11.5–15.5)
WBC: 4.1 10*3/uL (ref 4.0–10.5)
nRBC: 0 % (ref 0.0–0.2)

## 2021-08-21 NOTE — Progress Notes (Signed)
No need phlebotomy today per Dr. Alvy Bimler. Patient was notified and given a copy of his lab results.
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
## 2021-09-17 ENCOUNTER — Other Ambulatory Visit: Payer: Self-pay

## 2021-09-17 ENCOUNTER — Ambulatory Visit (INDEPENDENT_AMBULATORY_CARE_PROVIDER_SITE_OTHER): Payer: Medicare Other | Admitting: Family Medicine

## 2021-09-17 ENCOUNTER — Ambulatory Visit: Payer: Self-pay

## 2021-09-17 VITALS — BP 162/100 | HR 83 | Ht 68.0 in | Wt 191.6 lb

## 2021-09-17 DIAGNOSIS — M25551 Pain in right hip: Secondary | ICD-10-CM

## 2021-09-17 MED ORDER — LIDOCAINE 5 % EX PTCH: 1.0000 | MEDICATED_PATCH | CUTANEOUS | 0 refills | Status: DC

## 2021-09-17 NOTE — Patient Instructions (Addendum)
Thank you for coming in today.  ? ?You received a steroid injection in your hip today. Seek immediate medical attention if the joint becomes red, extremely painful, or is oozing fluid.  ? ?Recheck back with either myself or Dr. Tamala Julian as needed. ?

## 2021-09-29 DIAGNOSIS — Z20822 Contact with and (suspected) exposure to covid-19: Secondary | ICD-10-CM | POA: Diagnosis not present

## 2021-10-19 ENCOUNTER — Inpatient Hospital Stay: Payer: Medicare Other

## 2021-10-19 ENCOUNTER — Inpatient Hospital Stay (HOSPITAL_BASED_OUTPATIENT_CLINIC_OR_DEPARTMENT_OTHER): Payer: Medicare Other | Admitting: Hematology and Oncology

## 2021-10-19 ENCOUNTER — Other Ambulatory Visit: Payer: Self-pay | Admitting: Hematology and Oncology

## 2021-10-19 ENCOUNTER — Encounter: Payer: Self-pay | Admitting: Hematology and Oncology

## 2021-10-19 ENCOUNTER — Inpatient Hospital Stay: Payer: Medicare Other | Attending: Hematology and Oncology

## 2021-10-19 VITALS — BP 146/89 | HR 77 | Temp 98.0°F | Resp 20

## 2021-10-19 DIAGNOSIS — I10 Essential (primary) hypertension: Secondary | ICD-10-CM | POA: Insufficient documentation

## 2021-10-19 DIAGNOSIS — D751 Secondary polycythemia: Secondary | ICD-10-CM | POA: Insufficient documentation

## 2021-10-19 DIAGNOSIS — Z79899 Other long term (current) drug therapy: Secondary | ICD-10-CM | POA: Insufficient documentation

## 2021-10-19 LAB — CBC WITH DIFFERENTIAL/PLATELET
Abs Immature Granulocytes: 0.01 10*3/uL (ref 0.00–0.07)
Basophils Absolute: 0 10*3/uL (ref 0.0–0.1)
Basophils Relative: 1 %
Eosinophils Absolute: 0.1 10*3/uL (ref 0.0–0.5)
Eosinophils Relative: 2 %
HCT: 50.5 % (ref 39.0–52.0)
Hemoglobin: 15.3 g/dL (ref 13.0–17.0)
Immature Granulocytes: 0 %
Lymphocytes Relative: 43 %
Lymphs Abs: 1.8 10*3/uL (ref 0.7–4.0)
MCH: 22 pg — ABNORMAL LOW (ref 26.0–34.0)
MCHC: 30.3 g/dL (ref 30.0–36.0)
MCV: 72.8 fL — ABNORMAL LOW (ref 80.0–100.0)
Monocytes Absolute: 0.4 10*3/uL (ref 0.1–1.0)
Monocytes Relative: 10 %
Neutro Abs: 1.9 10*3/uL (ref 1.7–7.7)
Neutrophils Relative %: 44 %
Platelets: 179 10*3/uL (ref 150–400)
RBC: 6.94 MIL/uL — ABNORMAL HIGH (ref 4.22–5.81)
RDW: 20.8 % — ABNORMAL HIGH (ref 11.5–15.5)
WBC: 4.1 10*3/uL (ref 4.0–10.5)
nRBC: 0 % (ref 0.0–0.2)

## 2021-10-19 NOTE — Assessment & Plan Note (Addendum)
His blood count is stable with spacing out phlebotomy sessions ?I recommend phlebotomy every 4 months  ?He will get 1 unit of blood removed each time ?We will omit phlebotomy if his hemoglobin is less than 14 ?I plan to see him again in 12 months for further follow-up.  He agreed ?He will continue aspirin to prevent risk of thrombosis ?

## 2021-10-19 NOTE — Progress Notes (Signed)
?Charlann Boxer D.O. ?[facility] ?[place] ?Phone: [phone] ?Subjective:   ?I, Cory [name], am serving as a Education administrator for Dr. Hulan Saas. ?This visit occurred during the SARS-CoV-2 public health emergency.  Safety protocols were in place, including screening questions prior to the visit, additional usage of staff PPE, and extensive cleaning of exam room while observing appropriate contact time as indicated for disinfecting solutions.  ? ?I'm seeing this patient by the request  of:  [name], MD ? ?CC: Right hip pain ? ?[id number]  ?07/24/2021 ?Stable at the moment.  Discussed we will potentially consider another injection if necessary.  Patient has not had any significant worsening of arthritis on x-ray.  Patient is feeling better.  Follow-up again in 3 months ? ?Patient significantly better at this time.  Not having any significant pain that is stopping him from activity.  We will continue to monitor.  Follow-up again in 3 months. ? ?Update 10/22/2021 ?Cory [name] is a 86 y.o. male coming in with complaint of R hip pain. Patient states for about a month it was good after injection from Dr. Georgina Snell. Recently was walking and felt like the hip gave out. Thinking about surgery. Would like an injection if possible. Injured big toe on right side. Wants you to take a look. New handicap? ? ?  ? ?Past Medical History:  ?Diagnosis Date  ? Difficult intubation 02/23/2014  ? Glidescope for anterior airway  ? Erythrocytosis   ? GERD (gastroesophageal reflux disease)   ? History of kidney stones   ? Hypertension 01/27/2012  ? Prostate cancer ([place])   ? Thalassemia trait   ? ?Past Surgical History:  ?Procedure Laterality Date  ? BACK SURGERY  2007  ? CHOLECYSTECTOMY N/A 08/05/2017  ? Procedure: LAPAROSCOPIC CHOLECYSTECTOMY WITH INTRAOPERATIVE CHOLANGIOGRAM;  Surgeon: Donnie Mesa, MD;  Location: [facility];  Service: General;  Laterality: N/A;  ? COLONOSCOPY    ? KNEE ARTHROSCOPY     ? many yrs ago  ? LYMPHADENECTOMY Bilateral 02/23/2014  ? Procedure: PELVIC LYMPH NODE DISSECTION;  Surgeon: Alexis Frock, MD;  Location: [facility];  Service: Urology;  Laterality: Bilateral;  ? ROBOT ASSISTED LAPAROSCOPIC RADICAL PROSTATECTOMY N/A 02/23/2014  ? Procedure: ROBOTIC ASSISTED LAPAROSCOPIC RADICAL PROSTATECTOMY WITH INDOCYANINE GREEN DYE INJECTION;  Surgeon: Alexis Frock, MD;  Location: [facility];  Service: Urology;  Laterality: N/A;  ? ?Social History  ? ?Socioeconomic History  ? Marital status: Married  ?  Spouse name: Not on file  ? Number of children: Not on file  ? Years of education: Not on file  ? Highest education level: Not on file  ?Occupational History  ? Not on file  ?Tobacco Use  ? Smoking status: Former  ?  Types: Cigarettes  ?  Quit date: 02/18/1979  ?  Years since quitting: 42.7  ? Smokeless tobacco: Never  ?Vaping Use  ? Vaping Use: Never used  ?Substance and Sexual Activity  ? Alcohol use: Yes  ?  Comment: rare  ? Drug use: No  ? Sexual activity: Not on file  ?Other Topics Concern  ? Not on file  ?Social History Narrative  ? Not on file  ? ?Social Determinants of Health  ? ?Financial Resource Strain: Not on file  ?Food Insecurity: Not on file  ?Transportation Needs: Not on file  ?Physical Activity: Not on file  ?Stress: Not on file  ?Social Connections: Not on file  ? ?No Known Allergies ?Family History  ?Problem Relation Age  of Onset  ? Heart attack Mother 45  ? ? ? ?Current Outpatient Medications (Cardiovascular):  ?  atorvastatin (LIPITOR) 40 MG tablet, Take 40 mg by mouth daily. ?  benazepril (LOTENSIN) 40 MG tablet, Take 40 mg by mouth daily. ? ? ?Current Outpatient Medications (Analgesics):  ?  aspirin EC 81 MG tablet, Take 81 mg by mouth daily. ? ? ?Current Outpatient Medications (Other):  ?  oxybutynin (DITROPAN-XL) 10 MG 24 hr tablet, Take 10 mg by mouth daily. ? ? ?Reviewed prior external information including notes and imaging from  ?primary care provider ?As well as notes that  were available from care everywhere and other healthcare systems. ? ?Past medical history, social, surgical and family history all reviewed in electronic medical record.  No pertanent information unless stated regarding to the chief complaint.  ? ?Review of Systems: ? No headache, visual changes, nausea, vomiting, diarrhea, constipation, dizziness, abdominal pain, skin rash, fevers, chills, night sweats, weight loss, swollen lymph nodes, body aches, joint swelling, chest pain, shortness of breath, mood changes. POSITIVE muscle aches ? ?Objective  ?Blood pressure (!) 160/100, pulse 78, height '5\' 8"'$  (1.727 m), weight 192 lb (87.1 kg), SpO2 97 %. ?  ?General: No apparent distress alert and oriented x3 mood and affect normal, dressed appropriately.  ?HEENT: Pupils equal, extraocular movements intact  ?Respiratory: Patient's speak in full sentences and does not appear short of breath  ?Cardiovascular: No lower extremity edema, non tender, no erythema  ?Gait antalgic ?MSK: 0 degrees of internal rotation of the hip.  The patient does have tenderness in the groin with internal rotation.  Severely tender to palpation on the greater trochanteric area lower as well on the right side. ? ?After verbal consent patient was prepped with alcohol swab and with a 21-gauge 2 inch needle injected in the right greater trochanteric area with a total of 2 cc of 0.5% Marcaine and 1 cc of Kenalog 40 mg per mill.  No blood loss.  Postinjection instructions given ? ? ?  ?Impression and Recommendations:  ?  ? ?The above documentation has been reviewed and is accurate and complete Lyndal Pulley, DO ? ? ?

## 2021-10-19 NOTE — Progress Notes (Signed)
Cory [name] presents today for phlebotomy per MD orders. ?Phlebotomy procedure started at 8:20 and ended at 8:28. ?515 grams removed. ?Patient observed for 5 minutes after procedure without any incident. Declines to stay for full 30 minute post observation. ?Patient tolerated procedure well. ?IV needle removed intact. Food and fluids offered, fluids given. Left via ambulation, no respiratory distress noted. ? ? ?

## 2021-10-19 NOTE — Assessment & Plan Note (Signed)
He has mild intermittent hypertension ?Today's blood pressure could be elevated due to whitecoat hypertension ?Observe closely for now ?

## 2021-10-19 NOTE — Progress Notes (Signed)
? ?[place] ?OFFICE PROGRESS NOTE ? ?[name], MD ? ?ASSESSMENT & PLAN:  ?Polycythemia ?His blood count is stable with spacing out phlebotomy sessions ?I recommend phlebotomy every 4 months  ?He will get 1 unit of blood removed each time ?We will omit phlebotomy if his hemoglobin is less than 14 ?I plan to see him again in 12 months for further follow-up.  He agreed ?He will continue aspirin to prevent risk of thrombosis ? ?Essential hypertension ?He has mild intermittent hypertension ?Today's blood pressure could be elevated due to whitecoat hypertension ?Observe closely for now ? ?No orders of the defined types were placed in this encounter. ? ? ?The total time spent in the appointment was 20 minutes encounter with patients including review of chart and various tests results, discussions about plan of care and coordination of care plan ? ? All questions were answered. The patient knows to call the clinic with any problems, questions or concerns. No barriers to learning was detected. ? ? ? Heath Lark, MD ?4/21/20238:31 AM ? ?INTERVAL HISTORY: ?Cory [name] 86 y.o. male returns for chronic phlebotomy for polycythemia, secondary in nature ?He tolerated phlebotomy well ?Denies dizziness, shortness of breath or weakness ? ?SUMMARY OF HEMATOLOGIC HISTORY: ? ?This is a pleasant gentleman who was found to have abnormal CBC with polycythemia ?In 2014 he had a bone marrow aspirate and biopsy that came back nondiagnostic for myeloproliferative disorder. JAK 2 mutation testing was negative. The patient had intermittent phlebotomy sessions and remain on aspirin ? ?I have reviewed the past medical history, past surgical history, social history and family history with the patient and they are unchanged from previous note. ? ?ALLERGIES:  has No Known Allergies. ? ?MEDICATIONS:  ?Current Outpatient Medications  ?Medication Sig Dispense Refill  ? aspirin EC 81 MG tablet Take 81 mg by mouth daily.    ?  atorvastatin (LIPITOR) 40 MG tablet Take 40 mg by mouth daily.    ? benazepril (LOTENSIN) 40 MG tablet Take 40 mg by mouth daily.    ? oxybutynin (DITROPAN-XL) 10 MG 24 hr tablet Take 10 mg by mouth daily.    ? ?No current facility-administered medications for this visit.  ?  ? ?REVIEW OF SYSTEMS:   ?Constitutional: Denies fevers, chills or night sweats ?Eyes: Denies blurriness of vision ?Ears, nose, mouth, throat, and face: Denies mucositis or sore throat ?Respiratory: Denies cough, dyspnea or wheezes ?Cardiovascular: Denies palpitation, chest discomfort or lower extremity swelling ?Gastrointestinal:  Denies nausea, heartburn or change in bowel habits ?Skin: Denies abnormal skin rashes ?Lymphatics: Denies new lymphadenopathy or easy bruising ?Neurological:Denies numbness, tingling or new weaknesses ?Behavioral/Psych: Mood is stable, no new changes  ?All other systems were reviewed with the patient and are negative. ? ?PHYSICAL EXAMINATION: ?ECOG PERFORMANCE STATUS: 0 - Asymptomatic ? ?Vitals:  ? 10/19/21 0759  ?BP: (!) 174/94  ?Pulse: 81  ?Resp: 16  ?Temp: 98 ?F (36.7 ?C)  ?SpO2: 97%  ? ?Filed Weights  ? 10/19/21 0759  ?Weight: 193 lb 6.4 oz (87.7 kg)  ? ? ?GENERAL:alert, no distress and comfortable ?NEURO: alert & oriented x 3 with fluent speech, no focal motor/sensory deficits ? ?LABORATORY DATA:  ?I have reviewed the data as listed ? ?   ?Component Value Date/Time  ? NA 141 03/23/2021 0037  ? NA 144 03/03/2013 0944  ? K 4.2 03/23/2021 0037  ? K 3.6 03/03/2013 0944  ? CL 105 03/23/2021 0037  ? CL 106 08/04/2012 0957  ? CO2 21 (  L) 08/05/2017 9604  ? CO2 26 03/03/2013 0944  ? GLUCOSE 104 (H) 03/23/2021 0037  ? GLUCOSE 132 03/03/2013 0944  ? GLUCOSE 108 (H) 08/04/2012 0957  ? BUN 15 03/23/2021 0037  ? BUN 19.6 03/03/2013 0944  ? CREATININE 1.00 03/23/2021 0037  ? CREATININE 1.2 03/03/2013 0944  ? CALCIUM 9.0 08/05/2017 0906  ? CALCIUM 9.3 03/03/2013 0944  ? PROT 6.9 08/05/2017 0906  ? PROT 6.9 03/03/2013 0944  ?  ALBUMIN 4.0 08/05/2017 0906  ? ALBUMIN 3.9 03/03/2013 0944  ? AST 24 08/05/2017 0906  ? AST 20 03/03/2013 0944  ? ALT 25 08/05/2017 0906  ? ALT 27 03/03/2013 0944  ? ALKPHOS 74 08/05/2017 0906  ? ALKPHOS 69 03/03/2013 0944  ? BILITOT 1.3 (H) 08/05/2017 5409  ? BILITOT 1.29 (H) 03/03/2013 0944  ? GFRNONAA 57 (L) 08/05/2017 0906  ? GFRAA >60 08/05/2017 0906  ? ? ?No results found for: SPEP, UPEP ? ?Lab Results  ?Component Value Date  ? WBC 4.1 10/19/2021  ? NEUTROABS 1.9 10/19/2021  ? HGB 15.3 10/19/2021  ? HCT 50.5 10/19/2021  ? MCV 72.8 (L) 10/19/2021  ? PLT 179 10/19/2021  ? ? ?  Chemistry   ?   ?Component Value Date/Time  ? NA 141 03/23/2021 0037  ? NA 144 03/03/2013 0944  ? K 4.2 03/23/2021 0037  ? K 3.6 03/03/2013 0944  ? CL 105 03/23/2021 0037  ? CL 106 08/04/2012 0957  ? CO2 21 (L) 08/05/2017 0906  ? CO2 26 03/03/2013 0944  ? BUN 15 03/23/2021 0037  ? BUN 19.6 03/03/2013 0944  ? CREATININE 1.00 03/23/2021 0037  ? CREATININE 1.2 03/03/2013 0944  ?    ?Component Value Date/Time  ? CALCIUM 9.0 08/05/2017 0906  ? CALCIUM 9.3 03/03/2013 0944  ? ALKPHOS 74 08/05/2017 0906  ? ALKPHOS 69 03/03/2013 0944  ? AST 24 08/05/2017 0906  ? AST 20 03/03/2013 0944  ? ALT 25 08/05/2017 0906  ? ALT 27 03/03/2013 0944  ? BILITOT 1.3 (H) 08/05/2017 8119  ? BILITOT 1.29 (H) 03/03/2013 0944  ?  ? ? ?

## 2021-10-19 NOTE — Patient Instructions (Signed)

## 2021-10-22 ENCOUNTER — Ambulatory Visit (INDEPENDENT_AMBULATORY_CARE_PROVIDER_SITE_OTHER): Payer: Medicare Other | Admitting: Family Medicine

## 2021-10-22 DIAGNOSIS — M7061 Trochanteric bursitis, right hip: Secondary | ICD-10-CM | POA: Diagnosis not present

## 2021-10-22 DIAGNOSIS — M1611 Unilateral primary osteoarthritis, right hip: Secondary | ICD-10-CM

## 2021-10-22 NOTE — Patient Instructions (Signed)
Rigid sole shoes ?Avoid being bare foot ?GT injection today ?Repeat hip joint injection in 2 months if needed ?Getting the hip replaced may not be a bad idea ?See you again in 2 months ?

## 2021-10-22 NOTE — Assessment & Plan Note (Signed)
Will need to have replacement.  Patient is still awaiting this but will be considering it in the near future. ?

## 2021-10-22 NOTE — Assessment & Plan Note (Signed)
1 chronic, with exacerbation.  Still needs to have a replacement at some point in the near future.  Patient is highly active.  Discussed which activities to do which ones to avoid, increase activity slowly.  Follow-up again in 6 to 8 weeks. ?

## 2021-10-26 DIAGNOSIS — R059 Cough, unspecified: Secondary | ICD-10-CM | POA: Diagnosis not present

## 2021-10-26 DIAGNOSIS — M1611 Unilateral primary osteoarthritis, right hip: Secondary | ICD-10-CM | POA: Diagnosis not present

## 2021-10-26 DIAGNOSIS — E7800 Pure hypercholesterolemia, unspecified: Secondary | ICD-10-CM | POA: Diagnosis not present

## 2021-10-26 DIAGNOSIS — D751 Secondary polycythemia: Secondary | ICD-10-CM | POA: Diagnosis not present

## 2021-10-26 DIAGNOSIS — R7303 Prediabetes: Secondary | ICD-10-CM | POA: Diagnosis not present

## 2021-10-26 DIAGNOSIS — I10 Essential (primary) hypertension: Secondary | ICD-10-CM | POA: Diagnosis not present

## 2021-10-29 DIAGNOSIS — Z20822 Contact with and (suspected) exposure to covid-19: Secondary | ICD-10-CM | POA: Diagnosis not present

## 2021-12-19 NOTE — Progress Notes (Signed)
[name] [address] [place] [address] Phone: [phone] Subjective:   Cory [name], am serving as a scribe for Dr. Antoine Primas.   I'm seeing this patient by the request  of:  Maurice Small, MD  CC: Right hip pain  EGB:[id number]  10/22/2021 Will need to have replacement.  Patient is still awaiting this but will be considering it in the near future.  Update 12/25/2021 Cory [name] is a 86 y.o. male coming in with complaint of R hip pain. Patient states that his pain was manageable but over past week his pain increased. Does not feel like he needs injection though. Using [employer] for pain relief. Would like to discuss hip replacement.       Past Medical History:  Diagnosis Date   Difficult intubation 02/23/2014   Glidescope for anterior airway   Erythrocytosis    GERD (gastroesophageal reflux disease)    History of kidney stones    Hypertension 01/27/2012   Prostate cancer (HCC)    Thalassemia trait    Past Surgical History:  Procedure Laterality Date   BACK SURGERY  2007   CHOLECYSTECTOMY N/A 08/05/2017   Procedure: LAPAROSCOPIC CHOLECYSTECTOMY WITH INTRAOPERATIVE CHOLANGIOGRAM;  Surgeon: Manus Rudd, MD;  Location: [facility] OR;  Service: General;  Laterality: N/A;   COLONOSCOPY     KNEE ARTHROSCOPY     many yrs ago   LYMPHADENECTOMY Bilateral 02/23/2014   Procedure: PELVIC LYMPH NODE DISSECTION;  Surgeon: Sebastian Ache, MD;  Location: [facility];  Service: Urology;  Laterality: Bilateral;   ROBOT ASSISTED LAPAROSCOPIC RADICAL PROSTATECTOMY N/A 02/23/2014   Procedure: ROBOTIC ASSISTED LAPAROSCOPIC RADICAL PROSTATECTOMY WITH INDOCYANINE GREEN DYE INJECTION;  Surgeon: Sebastian Ache, MD;  Location: [facility];  Service: Urology;  Laterality: N/A;   Social History   Socioeconomic History   Marital status: Married    Spouse name: Not on file   Number of children: Not on file   Years of education: Not on file   Highest  education level: Not on file  Occupational History   Not on file  Tobacco Use   Smoking status: Former    Types: Cigarettes    Quit date: 02/18/1979    Years since quitting: 42.8   Smokeless tobacco: Never  Vaping Use   Vaping Use: Never used  Substance and Sexual Activity   Alcohol use: Yes    Comment: rare   Drug use: No   Sexual activity: Not on file  Other Topics Concern   Not on file  Social History Narrative   Not on file   Social Determinants of Health   Financial Resource Strain: Not on file  Food Insecurity: Not on file  Transportation Needs: Not on file  Physical Activity: Not on file  Stress: Not on file  Social Connections: Not on file   No Known Allergies Family History  Problem Relation Age of Onset   Heart attack Mother 44     Current Outpatient Medications (Cardiovascular):    atorvastatin (LIPITOR) 40 MG tablet, Take 40 mg by mouth daily.   benazepril (LOTENSIN) 40 MG tablet, Take 40 mg by mouth daily.   Current Outpatient Medications (Analgesics):    aspirin EC 81 MG tablet, Take 81 mg by mouth daily.   celecoxib (CELEBREX) 100 MG capsule, Take 1 capsule (100 mg total) by mouth 2 (two) times daily.   Current Outpatient Medications (Other):    oxybutynin (DITROPAN-XL) 10 MG 24 hr tablet, Take 10  mg by mouth daily.   Reviewed prior external information including notes and imaging from  primary care provider As well as notes that were available from care everywhere and other healthcare systems.  Past medical history, social, surgical and family history all reviewed in electronic medical record.  No pertanent information unless stated regarding to the chief complaint.   Review of Systems:  No headache, visual changes, nausea, vomiting, diarrhea, constipation, dizziness, abdominal pain, skin rash, fevers, chills, night sweats, weight loss, swollen lymph nodes, joint swelling, chest pain, shortness of breath, mood changes. POSITIVE muscle aches,  body aches  Objective  Blood pressure (!) 142/86, pulse 83, height 5\' 8"  (1.727 m), weight 191 lb (86.6 kg), SpO2 99 %.   General: No apparent distress alert and oriented x3 mood and affect normal, dressed appropriately.  HEENT: Pupils equal, extraocular movements intact  Respiratory: Patient's speak in full sentences and does not appear short of breath  Cardiovascular: No lower extremity edema, non tender, no erythema  Antalgic noted,  Right hip does have difficulty with internal rotation of only 5 degrees.  Increasing in discomfort noted.  Negative straight leg test.    Impression and Recommendations:     The above documentation has been reviewed and is accurate and complete Judi Saa, DO

## 2021-12-25 ENCOUNTER — Ambulatory Visit: Payer: Medicare Other

## 2021-12-25 ENCOUNTER — Ambulatory Visit (INDEPENDENT_AMBULATORY_CARE_PROVIDER_SITE_OTHER): Payer: Medicare Other | Admitting: Family Medicine

## 2021-12-25 ENCOUNTER — Encounter: Payer: Self-pay | Admitting: Family Medicine

## 2021-12-25 VITALS — BP 142/86 | HR 83 | Ht 68.0 in | Wt 191.0 lb

## 2021-12-25 DIAGNOSIS — M25551 Pain in right hip: Secondary | ICD-10-CM | POA: Diagnosis not present

## 2021-12-25 DIAGNOSIS — M1611 Unilateral primary osteoarthritis, right hip: Secondary | ICD-10-CM | POA: Diagnosis not present

## 2021-12-25 MED ORDER — CELECOXIB 100 MG PO CAPS: 100.0000 mg | ORAL_CAPSULE | Freq: Two times a day (BID) | ORAL | 1 refills | Status: DC

## 2022-01-16 DIAGNOSIS — M1611 Unilateral primary osteoarthritis, right hip: Secondary | ICD-10-CM | POA: Diagnosis not present

## 2022-01-29 DIAGNOSIS — Z01818 Encounter for other preprocedural examination: Secondary | ICD-10-CM | POA: Diagnosis not present

## 2022-01-29 DIAGNOSIS — M1990 Unspecified osteoarthritis, unspecified site: Secondary | ICD-10-CM | POA: Diagnosis not present

## 2022-01-29 DIAGNOSIS — M79674 Pain in right toe(s): Secondary | ICD-10-CM | POA: Diagnosis not present

## 2022-01-29 DIAGNOSIS — I10 Essential (primary) hypertension: Secondary | ICD-10-CM | POA: Diagnosis not present

## 2022-01-29 DIAGNOSIS — R600 Localized edema: Secondary | ICD-10-CM | POA: Diagnosis not present

## 2022-01-29 DIAGNOSIS — R7303 Prediabetes: Secondary | ICD-10-CM | POA: Diagnosis not present

## 2022-02-07 ENCOUNTER — Other Ambulatory Visit: Payer: Self-pay | Admitting: Orthopaedic Surgery

## 2022-02-21 ENCOUNTER — Inpatient Hospital Stay: Payer: Medicare Other | Attending: Hematology and Oncology

## 2022-02-21 ENCOUNTER — Inpatient Hospital Stay: Payer: Medicare Other

## 2022-02-21 ENCOUNTER — Other Ambulatory Visit: Payer: Self-pay

## 2022-02-21 VITALS — BP 150/84 | HR 73 | Temp 98.0°F | Resp 17

## 2022-02-21 DIAGNOSIS — D751 Secondary polycythemia: Secondary | ICD-10-CM

## 2022-02-21 LAB — CBC WITH DIFFERENTIAL/PLATELET
Abs Immature Granulocytes: 0.03 10*3/uL (ref 0.00–0.07)
Basophils Absolute: 0 10*3/uL (ref 0.0–0.1)
Basophils Relative: 1 %
Eosinophils Absolute: 0.1 10*3/uL (ref 0.0–0.5)
Eosinophils Relative: 2 %
HCT: 48.4 % (ref 39.0–52.0)
Hemoglobin: 15.7 g/dL (ref 13.0–17.0)
Immature Granulocytes: 1 %
Lymphocytes Relative: 38 %
Lymphs Abs: 1.6 10*3/uL (ref 0.7–4.0)
MCH: 23.8 pg — ABNORMAL LOW (ref 26.0–34.0)
MCHC: 32.4 g/dL (ref 30.0–36.0)
MCV: 73.3 fL — ABNORMAL LOW (ref 80.0–100.0)
Monocytes Absolute: 0.5 10*3/uL (ref 0.1–1.0)
Monocytes Relative: 11 %
Neutro Abs: 2.1 10*3/uL (ref 1.7–7.7)
Neutrophils Relative %: 47 %
Platelets: 163 10*3/uL (ref 150–400)
RBC: 6.6 MIL/uL — ABNORMAL HIGH (ref 4.22–5.81)
RDW: 20.1 % — ABNORMAL HIGH (ref 11.5–15.5)
WBC: 4.3 10*3/uL (ref 4.0–10.5)
nRBC: 0 % (ref 0.0–0.2)

## 2022-02-21 NOTE — Patient Instructions (Signed)

## 2022-02-21 NOTE — Progress Notes (Signed)
Cory [name] presents today for phlebotomy per MD orders (keep HGB <14).  Pts HGB is 15.7 today Phlebotomy procedure started at 0852 and ended at 0900. 525 grams removed. Patient observed for 5 minutes after procedure without any incident. Patient tolerated procedure well. IV needle removed intact.  Food and drink provided.  Pt declined to stay for 30 min.

## 2022-02-22 DIAGNOSIS — N3941 Urge incontinence: Secondary | ICD-10-CM | POA: Diagnosis not present

## 2022-02-22 DIAGNOSIS — R350 Frequency of micturition: Secondary | ICD-10-CM | POA: Diagnosis not present

## 2022-02-28 DIAGNOSIS — M1611 Unilateral primary osteoarthritis, right hip: Secondary | ICD-10-CM | POA: Diagnosis not present

## 2022-02-28 DIAGNOSIS — R262 Difficulty in walking, not elsewhere classified: Secondary | ICD-10-CM | POA: Diagnosis not present

## 2022-02-28 DIAGNOSIS — M25651 Stiffness of right hip, not elsewhere classified: Secondary | ICD-10-CM | POA: Diagnosis not present

## 2022-02-28 NOTE — Patient Instructions (Addendum)
DUE TO SPACE LIMITATIONS, ONLY TWO VISITORS  (aged 84 and older) ARE ALLOWED TO COME WITH YOU AND STAY IN THE WAITING ROOM DURING YOUR PRE OP AND PROCEDURE.   **NO VISITORS ARE ALLOWED IN THE SHORT STAY AREA OR RECOVERY ROOM!!**   You are not required to quarantine at this time prior to your surgery. However, you must do this: Hand Hygiene often Do NOT share personal items Notify your provider if you are in close contact with someone who has COVID or you develop fever 100.4 or greater, new onset of sneezing, cough, sore throat, shortness of breath or body aches.       Your procedure is scheduled on:  Tuesday March 12, 2022  Report to [facility] Main Entrance.  Report to admitting at:  06:15    AM  +++++Call this number if you have any questions or problems the morning of surgery [phone]  Do not eat food :After Midnight the night prior to your surgery/procedure.  After Midnight you may have the following liquids until  05:45 AM DAY OF SURGERY  Clear Liquid Diet Water Black Coffee (sugar ok, NO MILK/CREAM OR CREAMERS)  Tea (sugar ok, NO MILK/CREAM OR CREAMERS) regular and decaf                             Plain Jell-O (NO RED)                                           Fruit ices (not with fruit pulp, NO RED)                                     Popsicles (NO RED)                                                                  Juice: apple, WHITE grape, WHITE cranberry Sports drinks like Gatorade (NO RED)                     The day of surgery:  Drink ONE (1) Pre-Surgery  G2 at   05:45   AM the morning of surgery. Drink in one sitting. Do not sip.  This drink was given to you during your hospital pre-op appointment visit. Nothing else to drink after completing the Pre-Surgery G2.    FOLLOW ANY ADDITIONAL PRE OP INSTRUCTIONS YOU RECEIVED FROM YOUR SURGEON'S OFFICE!!!   Oral Hygiene is also important to reduce your risk of infection.        Remember - BRUSH  YOUR TEETH THE MORNING OF SURGERY WITH YOUR REGULAR TOOTHPASTE  Take ONLY these medicines the morning of surgery with A SIP OF WATER: Amlodipine, oxybutynin (Ditropan)             You may not have any metal on your body including jewelry, and body piercing  Do not wear  lotions, powders, cologne, or deodorant  Men may shave face and neck.  Contacts, Hearing Aids, dentures or bridgework may not be worn into  surgery.   You may bring a small overnight bag with you on the day of surgery, only pack items that are not valuable .[facility] IS NOT RESPONSIBLE   FOR VALUABLES THAT ARE LOST OR STOLEN.   DO NOT [place]. PHARMACY WILL DISPENSE MEDICATIONS LISTED ON YOUR MEDICATION LIST TO YOU DURING YOUR ADMISSION [place]!   Please read over the following fact sheets you were given: IF YOU HAVE QUESTIONS ABOUT YOUR PRE-OP INSTRUCTIONS, PLEASE CALL [phone]  ([place])    - Preparing for Surgery Before surgery, you can play an important role.  Because skin is not sterile, your skin needs to be as free of germs as possible.  You can reduce the number of germs on your skin by washing with CHG (chlorahexidine gluconate) soap before surgery.  CHG is an antiseptic cleaner which kills germs and bonds with the skin to continue killing germs even after washing. Please DO NOT use if you have an allergy to CHG or antibacterial soaps.  If your skin becomes reddened/irritated stop using the CHG and inform your nurse when you arrive at Short Stay. Do not shave (including legs and underarms) for at least 48 hours prior to the first CHG shower.  You may shave your face/neck.  Please follow these instructions carefully:  1.  Shower with CHG Soap the night before surgery and the  morning of surgery.  2.  If you choose to wash your hair, wash your hair first as usual with your normal  shampoo.  3.  After you shampoo, rinse your hair and body thoroughly to remove  the shampoo.                             4.  Use CHG as you would any other liquid soap.  You can apply chg directly to the skin and wash.  Gently with a scrungie or clean washcloth.  5.  Apply the CHG Soap to your body ONLY FROM THE NECK DOWN.   Do not use on face/ open                           Wound or open sores. Avoid contact with eyes, ears mouth and genitals (private parts).                       Wash face,  Genitals (private parts) with your normal soap.             6.  Wash thoroughly, paying special attention to the area where your  surgery  will be performed.  7.  Thoroughly rinse your body with warm water from the neck down.  8.  DO NOT shower/wash with your normal soap after using and rinsing off the CHG Soap.            9.  Pat yourself dry with a clean towel.            10.  Wear clean pajamas.            11.  Place clean sheets on your bed the night of your first shower and do not  sleep with pets.  ON THE DAY OF SURGERY : Do not apply any lotions/deodorants the morning of surgery.  Please wear clean clothes to the hospital/surgery center.    FAILURE TO FOLLOW THESE INSTRUCTIONS  MAY RESULT IN THE CANCELLATION OF YOUR SURGERY  PATIENT SIGNATURE_________________________________  NURSE SIGNATURE__________________________________  ________________________________________________________________________     Cory [name]    An incentive spirometer is a tool that can help keep your lungs clear and active. This tool measures how well you are filling your lungs with each breath. Taking long deep breaths may help reverse or decrease the chance of developing breathing (pulmonary) problems (especially infection) following: A long period of time when you are unable to move or be active. BEFORE THE PROCEDURE  If the spirometer includes an indicator to show your best effort, your nurse or respiratory therapist will set it to a desired goal. If possible, sit up straight or  lean slightly forward. Try not to slouch. Hold the incentive spirometer in an upright position. INSTRUCTIONS FOR USE  Sit on the edge of your bed if possible, or sit up as far as you can in bed or on a chair. Hold the incentive spirometer in an upright position. Breathe out normally. Place the mouthpiece in your mouth and seal your lips tightly around it. Breathe in slowly and as deeply as possible, raising the piston or the ball toward the top of the column. Hold your breath for 3-5 seconds or for as long as possible. Allow the piston or ball to fall to the bottom of the column. Remove the mouthpiece from your mouth and breathe out normally. Rest for a few seconds and repeat Steps 1 through 7 at least 10 times every 1-2 hours when you are awake. Take your time and take a few normal breaths between deep breaths. The spirometer may include an indicator to show your best effort. Use the indicator as a goal to work toward during each repetition. After each set of 10 deep breaths, practice coughing to be sure your lungs are clear. If you have an incision (the cut made at the time of surgery), support your incision when coughing by placing a pillow or rolled up towels firmly against it. Once you are able to get out of bed, walk around indoors and cough well. You may stop using the incentive spirometer when instructed by your caregiver.  RISKS AND COMPLICATIONS Take your time so you do not get dizzy or light-headed. If you are in pain, you may need to take or ask for pain medication before doing incentive spirometry. It is harder to take a deep breath if you are having pain. AFTER USE Rest and breathe slowly and easily. It can be helpful to keep track of a log of your progress. Your caregiver can provide you with a simple table to help with this. If you are using the spirometer at home, follow these instructions: [place] IF:  You are having difficultly using the spirometer. You have  trouble using the spirometer as often as instructed. Your pain medication is not giving enough relief while using the spirometer. You develop fever of 100.5 F (38.1 C) or higher.  SEEK IMMEDIATE MEDICAL CARE IF:  You cough up bloody sputum that had not been present before. You develop fever of 102 F (38.9 C) or greater. You develop worsening pain at or near the incision site. MAKE SURE YOU:  Understand these instructions. Will watch your condition. Will get help right away if you are not doing well or get worse. Document Released: 10/28/2006 Document Revised: 09/09/2011 Document Reviewed: 12/29/2006 [facility] Patient Information 2014 [place], [place].

## 2022-02-28 NOTE — Progress Notes (Addendum)
COVID Vaccine received:  '[]'$  No '[x]'$  Yes Date of any COVID positive Test in last 90 days: None  PCP - Early Osmond,  MD [facility]- ([place] 02-08-22, Wynema Birch, PA-C, seen for UTI) Cardiologist - n/a Hematology- Heath Lark, MD   Chest x-ray - Dr. Rhona Raider ordered CXR at PST appt EKG -  08-05-2017     will repeat at PST appt Stress Test - n/a ECHO - n/a Cardiac Cath - n/a CT Chest/ abd/pelvis - 03-23-21  Epic  Pacemaker/ICD device     '[x]'$  N/A Spinal Cord Stimulator:'[x]'$  No '[]'$  Yes      (Remind patient to bring remote DOS) Other Implants:   History of Sleep Apnea? '[x]'$  No '[]'$  Yes   Sleep Study Date:   CPAP used?- '[x]'$  No '[]'$  Yes  (Instruct to bring their mask & Tubing)  Does the patient monitor blood sugar? '[x]'$  No '[]'$  Yes  '[]'$  N/A Does patient have a [employer] or Dexacom? '[x]'$  No '[]'$  Yes   Fasting Blood Sugar Ranges-  Checks Blood Sugar __0   times a day   Blood Thinner Instructions: none Aspirin Instructions:  ASA 81 mg  don't stop per patient Last Dose:  ERAS Protocol Ordered: '[]'$  No  '[x]'$  Yes PRE-SURGERY '[]'$  ENSURE  '[x]'$  G2   Comments: Erythrocytosis, polycythemia, Hx TIA,   Activity level: Patient can climb a flight of stairs without difficulty; '[x]'$  No CP  '[x]'$  No SOB,  but would have _Knee pain_   Anesthesia review: Difficult intubation (Glidescope for anterior airway)  Lyndon Code, PA notified.   Patient denies shortness of breath, fever, cough and chest pain at PAT appointment.  Patient verbalized understanding and agreement to the Pre-Surgical Instructions that were given to them at this PAT appointment. Patient was also educated of the need to review these PAT instructions again prior to his/her surgery.I reviewed the appropriate phone numbers to call if they have any and questions or concerns.

## 2022-03-05 ENCOUNTER — Encounter (HOSPITAL_COMMUNITY)
Admission: RE | Admit: 2022-03-05 | Discharge: 2022-03-05 | Disposition: A | Discharge status: Home / Self Care | Payer: Medicare Other | Source: Ambulatory Visit | Attending: Orthopaedic Surgery | Admitting: Orthopaedic Surgery

## 2022-03-05 ENCOUNTER — Ambulatory Visit (HOSPITAL_COMMUNITY)
Admission: RE | Admit: 2022-03-05 | Discharge: 2022-03-05 | Disposition: A | Discharge status: Home / Self Care | Payer: Medicare Other | Source: Ambulatory Visit | Attending: Orthopaedic Surgery | Admitting: Orthopaedic Surgery

## 2022-03-05 ENCOUNTER — Other Ambulatory Visit: Payer: Self-pay

## 2022-03-05 ENCOUNTER — Encounter (HOSPITAL_COMMUNITY): Payer: Self-pay

## 2022-03-05 VITALS — BP 146/93 | HR 80 | Temp 97.9°F | Resp 16 | Ht 69.0 in | Wt 188.0 lb

## 2022-03-05 DIAGNOSIS — M1611 Unilateral primary osteoarthritis, right hip: Secondary | ICD-10-CM | POA: Insufficient documentation

## 2022-03-05 DIAGNOSIS — Z87891 Personal history of nicotine dependence: Secondary | ICD-10-CM | POA: Diagnosis not present

## 2022-03-05 DIAGNOSIS — Z8546 Personal history of malignant neoplasm of prostate: Secondary | ICD-10-CM | POA: Insufficient documentation

## 2022-03-05 DIAGNOSIS — R7303 Prediabetes: Secondary | ICD-10-CM | POA: Diagnosis not present

## 2022-03-05 DIAGNOSIS — I10 Essential (primary) hypertension: Secondary | ICD-10-CM | POA: Diagnosis not present

## 2022-03-05 DIAGNOSIS — Z01818 Encounter for other preprocedural examination: Secondary | ICD-10-CM | POA: Insufficient documentation

## 2022-03-05 HISTORY — DX: Prediabetes: R73.03

## 2022-03-05 HISTORY — DX: Unspecified osteoarthritis, unspecified site: M19.90

## 2022-03-05 LAB — BASIC METABOLIC PANEL
Anion gap: 6 (ref 5–15)
BUN: 20 mg/dL (ref 8–23)
CO2: 25 mmol/L (ref 22–32)
Calcium: 9.1 mg/dL (ref 8.9–10.3)
Chloride: 110 mmol/L (ref 98–111)
Creatinine, Ser: 1.09 mg/dL (ref 0.61–1.24)
GFR, Estimated: >60 mL/min (ref >60)
Glucose, Bld: 106 mg/dL — ABNORMAL HIGH (ref 70–99)
Potassium: 3.8 mmol/L (ref 3.5–5.1)
Sodium: 141 mmol/L (ref 135–145)

## 2022-03-05 LAB — SURGICAL PCR SCREEN
MRSA, PCR: NEGATIVE
Staphylococcus aureus: NEGATIVE

## 2022-03-05 LAB — HEMOGLOBIN A1C
Hgb A1c MFr Bld: 6.2 % — ABNORMAL HIGH (ref 4.8–5.6)
Mean Plasma Glucose: 131.24 mg/dL

## 2022-03-05 LAB — CBC
HCT: 50.9 % (ref 39.0–52.0)
Hemoglobin: 15.8 g/dL (ref 13.0–17.0)
MCH: 23.9 pg — ABNORMAL LOW (ref 26.0–34.0)
MCHC: 31 g/dL (ref 30.0–36.0)
MCV: 76.9 fL — ABNORMAL LOW (ref 80.0–100.0)
Platelets: 188 10*3/uL (ref 150–400)
RBC: 6.62 MIL/uL — ABNORMAL HIGH (ref 4.22–5.81)
RDW: 19.9 % — ABNORMAL HIGH (ref 11.5–15.5)
WBC: 4.5 10*3/uL (ref 4.0–10.5)
nRBC: 0 % (ref 0.0–0.2)

## 2022-03-05 LAB — GLUCOSE, CAPILLARY: Glucose-Capillary: 104 mg/dL — ABNORMAL HIGH (ref 70–99)

## 2022-03-06 NOTE — Care Plan (Signed)
Ortho Bundle Case Management Note  Patient Details  Name: Cory [name] MRN: [id number] Date of Birth: 25-Aug-1935    Met with patient in the office prior to surgery. He will discharge to home with family to assist. Has equipment at home. OPPT [facility] up with [facility]. Patient and MD in agreement with plan. Choice offered                  DME Arranged:    DME Agency:     HH Arranged:    HH Agency:     Additional Comments: Please contact me with any questions of if this plan should need to change.  Ladell Heads,  [place] Specialist  [phone] 03/06/2022, 2:15 PM

## 2022-03-06 NOTE — Progress Notes (Signed)
Anesthesia Chart Review   Case: [id number] Date/Time: 03/12/22 0830   Procedure: RIGHT TOTAL HIP ARTHROPLASTY ANTERIOR APPROACH (Right: Hip)   Anesthesia type: Spinal   Pre-op diagnosis: RIGHT HIP DEGENERATIVE JOINT DISEASE   Location: [facility]OM 06 / [facility]   Surgeons: Melrose Nakayama, MD       DISCUSSION:85 y.o. former smoker with h/o HTN, polycythemia, prostate cancer, right hip djd scheduled for above procedure 03/12/2022 with Dr. Melrose Nakayama.   H/o polycythemia followed by Dr. Alvy Bimler.  Last seen 10/19/21. Stable at this visit.  Phlebotomy every 4 months.   Per history difficult intubation due to anterior larynx and limited oral opening.  Per anesthesia note 02/23/2014 note, "Difficulty Due To: Difficulty was unanticipated, Difficult Airway- due to reduced neck mobility and Difficult Airway- due to anterior larynx Future Recommendations: Recommend- induction with short-acting agent, and alternative techniques readily available Comments: Attempted laryngoscopy with Sabra Heck 3 and unable to visualize any airway structures except base of epiglottis.  Switched to Glidescope and intubated without difficulty.  Grade 1 view."  Per anesthesia note 08/05/2017, "Comments: Smooth IV induction. Easy mask. DL x 1 Mil 2 by CRNA. Grade 4 view. DL x 1 MAC 4 by Dr Sabra Heck. Grade 3 view. Atraumatic intubation. +ETCO2. BBSE."  Anticipate pt can proceed with planned procedure barring acute status change.   VS: BP (!) 146/93 Comment: right arm sitting  Pulse 80   Temp 36.6 C (Oral)   Resp 16   Ht _0  (1.753 m)   Wt 85.3 kg   SpO2 98%   BMI 27.76 kg/m   PROVIDERS: Pahwani, Michell Heinrich, MD is PCP    LABS: Labs reviewed: Acceptable for surgery. (all labs ordered are listed, but only abnormal results are displayed)  Labs Reviewed  HEMOGLOBIN A1C - Abnormal; Notable for the following components:      Result Value   Hgb A1c MFr Bld 6.2 (*)    All other components within normal limits  BASIC METABOLIC  PANEL - Abnormal; Notable for the following components:   Glucose, Bld 106 (*)    All other components within normal limits  CBC - Abnormal; Notable for the following components:   RBC 6.62 (*)    MCV 76.9 (*)    MCH 23.9 (*)    RDW 19.9 (*)    All other components within normal limits  GLUCOSE, CAPILLARY - Abnormal; Notable for the following components:   Glucose-Capillary 104 (*)    All other components within normal limits  SURGICAL PCR SCREEN  TYPE AND SCREEN     IMAGES:   EKG:   CV:  Past Medical History:  Diagnosis Date   Arthritis    Difficult intubation 02/23/2014   Glidescope for anterior airway   Erythrocytosis    GERD (gastroesophageal reflux disease)    History of kidney stones    Hypertension 01/27/2012   Pre-diabetes    Prostate cancer ([place])    Thalassemia trait     Past Surgical History:  Procedure Laterality Date   BACK SURGERY  2007   CHOLECYSTECTOMY N/A 08/05/2017   Procedure: LAPAROSCOPIC CHOLECYSTECTOMY WITH INTRAOPERATIVE CHOLANGIOGRAM;  Surgeon: Donnie Mesa, MD;  Location: [facility];  Service: General;  Laterality: N/A;   COLONOSCOPY     KNEE ARTHROSCOPY     many yrs ago   LYMPHADENECTOMY Bilateral 02/23/2014   Procedure: PELVIC LYMPH NODE DISSECTION;  Surgeon: Alexis Frock, MD;  [facility];  Service: Urology;  Laterality: Bilateral;   ROBOT ASSISTED LAPAROSCOPIC  RADICAL PROSTATECTOMY N/A 02/23/2014   Procedure: ROBOTIC ASSISTED LAPAROSCOPIC RADICAL PROSTATECTOMY WITH INDOCYANINE GREEN DYE INJECTION;  Surgeon: Alexis Frock, MD;  [facility];  Service: Urology;  Laterality: N/A;    MEDICATIONS:  amLODipine (NORVASC) 5 MG tablet   aspirin EC 81 MG tablet   atorvastatin (LIPITOR) 40 MG tablet   benazepril (LOTENSIN) 40 MG tablet   celecoxib (CELEBREX) 100 MG capsule   clotrimazole (LOTRIMIN) 1 % cream   oxybutynin (DITROPAN-XL) 10 MG 24 hr tablet   No current facility-administered medications for this encounter.   Konrad Felix Ward, PA-C [facility] [phone]

## 2022-03-06 NOTE — Anesthesia Preprocedure Evaluation (Addendum)
Anesthesia Evaluation  Patient identified by MRN, date of birth, ID band Patient awake    Reviewed: Allergy & Precautions, NPO status , Patient's Chart, lab work & pertinent test results  Airway Mallampati: IV  TM Distance: >3 FB Neck ROM: Full    Dental no notable dental hx.    Pulmonary former smoker,    Pulmonary exam normal        Cardiovascular hypertension, Pt. on medications Normal cardiovascular exam     Neuro/Psych negative neurological ROS  negative psych ROS   GI/Hepatic negative GI ROS, Neg liver ROS,   Endo/Other  negative endocrine ROS  Renal/GU negative Renal ROS     Musculoskeletal  (+) Arthritis ,   Abdominal   Peds  Hematology  (+) Blood dyscrasia, ,   Anesthesia Other Findings RIGHT HIP DEGENERATIVE JOINT DISEASE  Reproductive/Obstetrics                           Anesthesia Physical Anesthesia Plan  ASA: 2  Anesthesia Plan: Spinal   Post-op Pain Management:    Induction: Intravenous  PONV Risk Score and Plan: 1 and Ondansetron, Dexamethasone, Propofol infusion and Treatment may vary due to age or medical condition  Airway Management Planned: Simple Face Mask  Additional Equipment:   Intra-op Plan:   Post-operative Plan:   Informed Consent: I have reviewed the patients History and Physical, chart, labs and discussed the procedure including the risks, benefits and alternatives for the proposed anesthesia with the patient or authorized representative who has indicated his/her understanding and acceptance.     Dental advisory given  Plan Discussed with: CRNA  Anesthesia Plan Comments: (See PAT note 03/05/2022)       Anesthesia Quick Evaluation

## 2022-03-11 MED ORDER — TRANEXAMIC ACID 1000 MG/10ML IV SOLN
2000.0000 mg | INTRAVENOUS | Status: DC
  Filled 2022-03-11: qty 20
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
## 2022-03-12 ENCOUNTER — Observation Stay (HOSPITAL_COMMUNITY)
Admission: RE | Admit: 2022-03-12 | Discharge: 2022-03-13 | Disposition: A | Discharge status: Home / Self Care | Payer: Medicare Other | Source: Ambulatory Visit | Attending: Orthopaedic Surgery | Admitting: Orthopaedic Surgery

## 2022-03-12 ENCOUNTER — Ambulatory Visit (HOSPITAL_COMMUNITY): Payer: Medicare Other

## 2022-03-12 ENCOUNTER — Other Ambulatory Visit: Payer: Self-pay

## 2022-03-12 ENCOUNTER — Encounter (HOSPITAL_COMMUNITY)
Admission: RE | Disposition: A | Discharge status: Home / Self Care | Payer: Self-pay | Source: Ambulatory Visit | Attending: Orthopaedic Surgery

## 2022-03-12 ENCOUNTER — Encounter (HOSPITAL_COMMUNITY): Payer: Self-pay | Admitting: Orthopaedic Surgery

## 2022-03-12 ENCOUNTER — Ambulatory Visit (HOSPITAL_COMMUNITY): Payer: Medicare Other | Admitting: Physician Assistant

## 2022-03-12 ENCOUNTER — Ambulatory Visit (HOSPITAL_BASED_OUTPATIENT_CLINIC_OR_DEPARTMENT_OTHER): Payer: Medicare Other | Admitting: Anesthesiology

## 2022-03-12 DIAGNOSIS — Z87891 Personal history of nicotine dependence: Secondary | ICD-10-CM | POA: Insufficient documentation

## 2022-03-12 DIAGNOSIS — Z96641 Presence of right artificial hip joint: Secondary | ICD-10-CM | POA: Diagnosis present

## 2022-03-12 DIAGNOSIS — Z8546 Personal history of malignant neoplasm of prostate: Secondary | ICD-10-CM | POA: Diagnosis not present

## 2022-03-12 DIAGNOSIS — M1611 Unilateral primary osteoarthritis, right hip: Secondary | ICD-10-CM | POA: Diagnosis not present

## 2022-03-12 DIAGNOSIS — I10 Essential (primary) hypertension: Secondary | ICD-10-CM | POA: Insufficient documentation

## 2022-03-12 DIAGNOSIS — Z79899 Other long term (current) drug therapy: Secondary | ICD-10-CM | POA: Diagnosis not present

## 2022-03-12 DIAGNOSIS — Z96642 Presence of left artificial hip joint: Secondary | ICD-10-CM | POA: Diagnosis not present

## 2022-03-12 DIAGNOSIS — R7303 Prediabetes: Secondary | ICD-10-CM | POA: Insufficient documentation

## 2022-03-12 DIAGNOSIS — Z7982 Long term (current) use of aspirin: Secondary | ICD-10-CM | POA: Diagnosis not present

## 2022-03-12 DIAGNOSIS — Z471 Aftercare following joint replacement surgery: Secondary | ICD-10-CM | POA: Diagnosis not present

## 2022-03-12 HISTORY — PX: TOTAL HIP ARTHROPLASTY: SHX124

## 2022-03-12 LAB — TYPE AND SCREEN
ABO/RH(D): O POS
Antibody Screen: NEGATIVE

## 2022-03-12 LAB — GLUCOSE, CAPILLARY: Glucose-Capillary: 155 mg/dL — ABNORMAL HIGH (ref 70–99)

## 2022-03-12 SURGERY — ARTHROPLASTY, HIP, TOTAL, ANTERIOR APPROACH
Anesthesia: Spinal | Site: Hip | Laterality: Right

## 2022-03-12 MED ORDER — TRANEXAMIC ACID-NACL 1000-0.7 MG/100ML-% IV SOLN
1000.0000 mg | INTRAVENOUS | Status: AC
  Administered 2022-03-12: 1000 mg via INTRAVENOUS
  Filled 2022-03-12: qty 100

## 2022-03-12 MED ORDER — ACETAMINOPHEN 325 MG PO TABS: 325.0000 mg | ORAL_TABLET | Freq: Four times a day (QID) | ORAL | Status: DC | PRN

## 2022-03-12 MED ORDER — FENTANYL CITRATE PF 50 MCG/ML IJ SOSY
PREFILLED_SYRINGE | INTRAMUSCULAR | Status: AC
  Filled 2022-03-12: qty 1

## 2022-03-12 MED ORDER — AMLODIPINE BESYLATE 5 MG PO TABS
5.0000 mg | ORAL_TABLET | Freq: Every day | ORAL | Status: DC
  Administered 2022-03-13: 5 mg via ORAL
  Filled 2022-03-12: qty 1

## 2022-03-12 MED ORDER — OXYCODONE HCL 5 MG PO TABS
5.0000 mg | ORAL_TABLET | Freq: Once | ORAL | Status: AC
  Administered 2022-03-12: 5 mg via ORAL

## 2022-03-12 MED ORDER — HYDROCODONE-ACETAMINOPHEN 5-325 MG PO TABS
1.0000 | ORAL_TABLET | ORAL | Status: DC | PRN
  Administered 2022-03-12 – 2022-03-13 (×3): 1 via ORAL
  Filled 2022-03-12 (×3): qty 1

## 2022-03-12 MED ORDER — ONDANSETRON HCL 4 MG PO TABS: 4.0000 mg | ORAL_TABLET | Freq: Four times a day (QID) | ORAL | Status: DC | PRN

## 2022-03-12 MED ORDER — BUPIVACAINE LIPOSOME 1.3 % IJ SUSP
INTRAMUSCULAR | Status: DC | PRN
  Administered 2022-03-12: 20 mL

## 2022-03-12 MED ORDER — METHOCARBAMOL 500 MG IVPB - SIMPLE MED
500.0000 mg | Freq: Four times a day (QID) | INTRAVENOUS | Status: DC | PRN
  Administered 2022-03-12: 500 mg via INTRAVENOUS

## 2022-03-12 MED ORDER — ALUM & MAG HYDROXIDE-SIMETH 200-200-20 MG/5ML PO SUSP: 30.0000 mL | ORAL | Status: DC | PRN

## 2022-03-12 MED ORDER — ACETAMINOPHEN 10 MG/ML IV SOLN
1000.0000 mg | Freq: Once | INTRAVENOUS | Status: DC | PRN
  Administered 2022-03-12: 1000 mg via INTRAVENOUS

## 2022-03-12 MED ORDER — ASPIRIN 81 MG PO CHEW
81.0000 mg | CHEWABLE_TABLET | Freq: Two times a day (BID) | ORAL | Status: DC
  Administered 2022-03-13: 81 mg via ORAL
  Filled 2022-03-12: qty 1

## 2022-03-12 MED ORDER — LACTATED RINGERS IV SOLN: INTRAVENOUS | Status: DC

## 2022-03-12 MED ORDER — BISACODYL 5 MG PO TBEC: 5.0000 mg | DELAYED_RELEASE_TABLET | Freq: Every day | ORAL | Status: DC | PRN

## 2022-03-12 MED ORDER — ONDANSETRON HCL 4 MG/2ML IJ SOLN
INTRAMUSCULAR | Status: DC | PRN
  Administered 2022-03-12: 4 mg via INTRAVENOUS

## 2022-03-12 MED ORDER — PROPOFOL 10 MG/ML IV BOLUS
INTRAVENOUS | Status: DC | PRN
  Administered 2022-03-12 (×3): 20 mg via INTRAVENOUS

## 2022-03-12 MED ORDER — ASPIRIN EC 81 MG PO TBEC: 81.0000 mg | DELAYED_RELEASE_TABLET | Freq: Two times a day (BID) | ORAL | 0 refills | Status: AC

## 2022-03-12 MED ORDER — PROPOFOL 500 MG/50ML IV EMUL
INTRAVENOUS | Status: DC | PRN
  Administered 2022-03-12: 50 ug/kg/min via INTRAVENOUS

## 2022-03-12 MED ORDER — CHLORHEXIDINE GLUCONATE 0.12 % MT SOLN
15.0000 mL | Freq: Once | OROMUCOSAL | Status: AC
  Administered 2022-03-12: 15 mL via OROMUCOSAL

## 2022-03-12 MED ORDER — ATORVASTATIN CALCIUM 40 MG PO TABS
40.0000 mg | ORAL_TABLET | Freq: Every day | ORAL | Status: DC
  Administered 2022-03-13: 40 mg via ORAL
  Filled 2022-03-12: qty 1

## 2022-03-12 MED ORDER — DEXAMETHASONE SODIUM PHOSPHATE 10 MG/ML IJ SOLN
INTRAMUSCULAR | Status: DC | PRN
  Administered 2022-03-12: 10 mg via INTRAVENOUS

## 2022-03-12 MED ORDER — TRANEXAMIC ACID-NACL 1000-0.7 MG/100ML-% IV SOLN
1000.0000 mg | Freq: Once | INTRAVENOUS | Status: AC
  Administered 2022-03-12: 1000 mg via INTRAVENOUS

## 2022-03-12 MED ORDER — CEFAZOLIN SODIUM-DEXTROSE 2-4 GM/100ML-% IV SOLN
2.0000 g | INTRAVENOUS | Status: AC
  Administered 2022-03-12: 2 g via INTRAVENOUS
  Filled 2022-03-12: qty 100

## 2022-03-12 MED ORDER — ORAL CARE MOUTH RINSE: 15.0000 mL | Freq: Once | OROMUCOSAL | Status: AC

## 2022-03-12 MED ORDER — CEFAZOLIN SODIUM-DEXTROSE 2-4 GM/100ML-% IV SOLN
INTRAVENOUS | Status: AC
  Filled 2022-03-12: qty 100

## 2022-03-12 MED ORDER — METHOCARBAMOL 500 MG IVPB - SIMPLE MED
INTRAVENOUS | Status: AC
  Filled 2022-03-12: qty 55

## 2022-03-12 MED ORDER — METOCLOPRAMIDE HCL 5 MG/ML IJ SOLN: 5.0000 mg | Freq: Three times a day (TID) | INTRAMUSCULAR | Status: DC | PRN

## 2022-03-12 MED ORDER — POVIDONE-IODINE 10 % EX SWAB
2.0000 | Freq: Once | CUTANEOUS | Status: AC
  Administered 2022-03-12: 2 via TOPICAL

## 2022-03-12 MED ORDER — AMISULPRIDE (ANTIEMETIC) 5 MG/2ML IV SOLN: 10.0000 mg | Freq: Once | INTRAVENOUS | Status: DC | PRN

## 2022-03-12 MED ORDER — PROPOFOL 10 MG/ML IV BOLUS
INTRAVENOUS | Status: AC
  Filled 2022-03-12: qty 20

## 2022-03-12 MED ORDER — METHOCARBAMOL 500 MG PO TABS
500.0000 mg | ORAL_TABLET | Freq: Four times a day (QID) | ORAL | Status: DC | PRN
  Administered 2022-03-12 – 2022-03-13 (×2): 500 mg via ORAL
  Filled 2022-03-12 (×2): qty 1

## 2022-03-12 MED ORDER — KETOROLAC TROMETHAMINE 15 MG/ML IJ SOLN
7.5000 mg | Freq: Four times a day (QID) | INTRAMUSCULAR | Status: AC
  Administered 2022-03-12 – 2022-03-13 (×4): 7.5 mg via INTRAVENOUS
  Filled 2022-03-12 (×3): qty 1

## 2022-03-12 MED ORDER — DOCUSATE SODIUM 100 MG PO CAPS
100.0000 mg | ORAL_CAPSULE | Freq: Two times a day (BID) | ORAL | Status: DC
  Administered 2022-03-12 – 2022-03-13 (×2): 100 mg via ORAL
  Filled 2022-03-12 (×2): qty 1

## 2022-03-12 MED ORDER — TIZANIDINE HCL 2 MG PO TABS: 2.0000 mg | ORAL_TABLET | Freq: Four times a day (QID) | ORAL | 1 refills | Status: DC | PRN

## 2022-03-12 MED ORDER — EPHEDRINE SULFATE-NACL 50-0.9 MG/10ML-% IV SOSY
PREFILLED_SYRINGE | INTRAVENOUS | Status: DC | PRN
  Administered 2022-03-12: 5 mg via INTRAVENOUS

## 2022-03-12 MED ORDER — LIDOCAINE HCL (PF) 2 % IJ SOLN
INTRAMUSCULAR | Status: AC
  Filled 2022-03-12: qty 5

## 2022-03-12 MED ORDER — OXYBUTYNIN CHLORIDE ER 5 MG PO TB24
10.0000 mg | ORAL_TABLET | Freq: Every day | ORAL | Status: DC
  Administered 2022-03-13: 10 mg via ORAL
  Filled 2022-03-12: qty 2

## 2022-03-12 MED ORDER — FENTANYL CITRATE PF 50 MCG/ML IJ SOSY
25.0000 ug | PREFILLED_SYRINGE | INTRAMUSCULAR | Status: DC | PRN
  Administered 2022-03-12: 50 ug via INTRAVENOUS

## 2022-03-12 MED ORDER — ACETAMINOPHEN 10 MG/ML IV SOLN
INTRAVENOUS | Status: AC
  Filled 2022-03-12: qty 100

## 2022-03-12 MED ORDER — ACETAMINOPHEN 500 MG PO TABS
500.0000 mg | ORAL_TABLET | Freq: Four times a day (QID) | ORAL | Status: DC
  Administered 2022-03-12 – 2022-03-13 (×3): 500 mg via ORAL
  Filled 2022-03-12 (×4): qty 1

## 2022-03-12 MED ORDER — MORPHINE SULFATE (PF) 2 MG/ML IV SOLN: 0.5000 mg | INTRAVENOUS | Status: DC | PRN

## 2022-03-12 MED ORDER — METOCLOPRAMIDE HCL 5 MG PO TABS: 5.0000 mg | ORAL_TABLET | Freq: Three times a day (TID) | ORAL | Status: DC | PRN

## 2022-03-12 MED ORDER — FENTANYL CITRATE (PF) 100 MCG/2ML IJ SOLN
INTRAMUSCULAR | Status: DC | PRN
  Administered 2022-03-12: 100 ug via INTRAVENOUS

## 2022-03-12 MED ORDER — TRANEXAMIC ACID 1000 MG/10ML IV SOLN
INTRAVENOUS | Status: DC | PRN
  Administered 2022-03-12: 2000 mg via TOPICAL

## 2022-03-12 MED ORDER — 0.9 % SODIUM CHLORIDE (POUR BTL) OPTIME
TOPICAL | Status: DC | PRN
  Administered 2022-03-12: 1000 mL

## 2022-03-12 MED ORDER — ONDANSETRON HCL 4 MG/2ML IJ SOLN
INTRAMUSCULAR | Status: AC
  Filled 2022-03-12: qty 2

## 2022-03-12 MED ORDER — LACTATED RINGERS IV BOLUS: 250.0000 mL | Freq: Once | INTRAVENOUS | Status: DC

## 2022-03-12 MED ORDER — BUPIVACAINE LIPOSOME 1.3 % IJ SUSP: 10.0000 mL | Freq: Once | INTRAMUSCULAR | Status: DC

## 2022-03-12 MED ORDER — MENTHOL 3 MG MT LOZG: 1.0000 | LOZENGE | OROMUCOSAL | Status: DC | PRN

## 2022-03-12 MED ORDER — PHENOL 1.4 % MT LIQD: 1.0000 | OROMUCOSAL | Status: DC | PRN

## 2022-03-12 MED ORDER — PROPOFOL 1000 MG/100ML IV EMUL
INTRAVENOUS | Status: AC
  Filled 2022-03-12: qty 100

## 2022-03-12 MED ORDER — ONDANSETRON HCL 4 MG/2ML IJ SOLN: 4.0000 mg | Freq: Four times a day (QID) | INTRAMUSCULAR | Status: DC | PRN

## 2022-03-12 MED ORDER — STERILE WATER FOR IRRIGATION IR SOLN
Status: DC | PRN
  Administered 2022-03-12: 2000 mL

## 2022-03-12 MED ORDER — ONDANSETRON HCL 4 MG/2ML IJ SOLN: 4.0000 mg | Freq: Once | INTRAMUSCULAR | Status: DC | PRN

## 2022-03-12 MED ORDER — PHENYLEPHRINE HCL-NACL 20-0.9 MG/250ML-% IV SOLN
INTRAVENOUS | Status: DC | PRN
  Administered 2022-03-12: 50 ug/min via INTRAVENOUS

## 2022-03-12 MED ORDER — BUPIVACAINE-EPINEPHRINE (PF) 0.5% -1:200000 IJ SOLN
INTRAMUSCULAR | Status: AC
  Filled 2022-03-12: qty 30

## 2022-03-12 MED ORDER — HYDROCODONE-ACETAMINOPHEN 5-325 MG PO TABS: 1.0000 | ORAL_TABLET | Freq: Four times a day (QID) | ORAL | 0 refills | Status: DC | PRN

## 2022-03-12 MED ORDER — BUPIVACAINE-EPINEPHRINE (PF) 0.5% -1:200000 IJ SOLN
INTRAMUSCULAR | Status: DC | PRN
  Administered 2022-03-12: 30 mL

## 2022-03-12 MED ORDER — TRANEXAMIC ACID-NACL 1000-0.7 MG/100ML-% IV SOLN
INTRAVENOUS | Status: AC
  Filled 2022-03-12: qty 100

## 2022-03-12 MED ORDER — BUPIVACAINE IN DEXTROSE 0.75-8.25 % IT SOLN
INTRATHECAL | Status: DC | PRN
  Administered 2022-03-12: 1.8 mL via INTRATHECAL

## 2022-03-12 MED ORDER — CEFAZOLIN SODIUM-DEXTROSE 2-4 GM/100ML-% IV SOLN
2.0000 g | Freq: Four times a day (QID) | INTRAVENOUS | Status: AC
  Administered 2022-03-12 (×2): 2 g via INTRAVENOUS
  Filled 2022-03-12: qty 100

## 2022-03-12 MED ORDER — DEXAMETHASONE SODIUM PHOSPHATE 10 MG/ML IJ SOLN
INTRAMUSCULAR | Status: AC
  Filled 2022-03-12: qty 1

## 2022-03-12 MED ORDER — FENTANYL CITRATE (PF) 100 MCG/2ML IJ SOLN
INTRAMUSCULAR | Status: AC
  Filled 2022-03-12: qty 2

## 2022-03-12 MED ORDER — KETOROLAC TROMETHAMINE 15 MG/ML IJ SOLN
INTRAMUSCULAR | Status: AC
  Filled 2022-03-12: qty 1

## 2022-03-12 MED ORDER — EPHEDRINE 5 MG/ML INJ
INTRAVENOUS | Status: AC
  Filled 2022-03-12: qty 5

## 2022-03-12 MED ORDER — LACTATED RINGERS IV BOLUS
250.0000 mL | Freq: Once | INTRAVENOUS | Status: AC
  Administered 2022-03-12: 250 mL via INTRAVENOUS

## 2022-03-12 MED ORDER — DIPHENHYDRAMINE HCL 12.5 MG/5ML PO ELIX: 12.5000 mg | ORAL_SOLUTION | ORAL | Status: DC | PRN

## 2022-03-12 MED ORDER — BUPIVACAINE LIPOSOME 1.3 % IJ SUSP
INTRAMUSCULAR | Status: AC
  Filled 2022-03-12: qty 10

## 2022-03-12 MED ORDER — OXYCODONE HCL 5 MG PO TABS
ORAL_TABLET | ORAL | Status: AC
  Filled 2022-03-12: qty 1

## 2022-03-12 MED ORDER — LACTATED RINGERS IV BOLUS
500.0000 mL | Freq: Once | INTRAVENOUS | Status: AC
  Administered 2022-03-12: 500 mL via INTRAVENOUS

## 2022-03-12 MED ORDER — BENAZEPRIL HCL 20 MG PO TABS
40.0000 mg | ORAL_TABLET | Freq: Every day | ORAL | Status: DC
  Administered 2022-03-13: 40 mg via ORAL
  Filled 2022-03-12: qty 2

## 2022-03-12 SURGICAL SUPPLY — 53 items
BAG COUNTER SPONGE SURGICOUNT (BAG) IMPLANT
BAG DECANTER FOR FLEXI CONT (MISCELLANEOUS) ×1 IMPLANT
BAG SPNG CNTER NS LX DISP (BAG)
BLADE SAW SGTL 18X1.27X75 (BLADE) ×1 IMPLANT
BOOTIES KNEE HIGH SLOAN (MISCELLANEOUS) ×1 IMPLANT
CATH COUDE 5CC RIBBED (CATHETERS) IMPLANT
CATH RIBBED COUDE 5CC (CATHETERS) ×1
CELLS DAT CNTRL 66122 CELL SVR (MISCELLANEOUS) ×1 IMPLANT
COVER PERINEAL POST (MISCELLANEOUS) ×1 IMPLANT
COVER SURGICAL LIGHT HANDLE (MISCELLANEOUS) ×1 IMPLANT
CUP PINN GRIPTON 54 100 (Cup) IMPLANT
DRAPE FOOT SWITCH (DRAPES) ×1 IMPLANT
DRAPE IMP U-DRAPE 54X76 (DRAPES) ×1 IMPLANT
DRAPE STERI IOBAN 125X83 (DRAPES) ×1 IMPLANT
DRAPE U-SHAPE 47X51 STRL (DRAPES) ×2 IMPLANT
DRSG AQUACEL AG ADV 3.5X 6 (GAUZE/BANDAGES/DRESSINGS) ×1 IMPLANT
DURAPREP 26ML APPLICATOR (WOUND CARE) ×1 IMPLANT
ELECT BLADE TIP CTD 4 INCH (ELECTRODE) ×1 IMPLANT
ELECT REM PT RETURN 15FT ADLT (MISCELLANEOUS) ×1 IMPLANT
ELIMINATOR HOLE APEX DEPUY (Hips) IMPLANT
FEM STEM 12/14 TAPER SZ 4 HIP (Orthopedic Implant) ×1 IMPLANT
FEMORAL STEM 12/14 TPR SZ4 HIP (Orthopedic Implant) IMPLANT
GLOVE BIO SURGEON STRL SZ 6.5 (GLOVE) IMPLANT
GLOVE BIO SURGEON STRL SZ7.5 (GLOVE) IMPLANT
GLOVE BIO SURGEON STRL SZ8 (GLOVE) ×2 IMPLANT
GLOVE BIOGEL PI IND STRL 7.0 (GLOVE) IMPLANT
GLOVE BIOGEL PI IND STRL 8 (GLOVE) ×2 IMPLANT
GOWN STRL REUS W/ TWL XL LVL3 (GOWN DISPOSABLE) ×2 IMPLANT
GOWN STRL REUS W/TWL XL LVL3 (GOWN DISPOSABLE) ×2
HEAD M SROM 36MM PLUS 1.5 (Hips) IMPLANT
HOLDER FOLEY CATH W/STRAP (MISCELLANEOUS) ×1 IMPLANT
KIT TURNOVER KIT A (KITS) IMPLANT
LINER NEUTRAL 54X36MM PLUS 4 (Hips) IMPLANT
MANIFOLD NEPTUNE II (INSTRUMENTS) ×1 IMPLANT
NEEDLE HYPO 22GX1.5 SAFETY (NEEDLE) ×1 IMPLANT
NS IRRIG 1000ML POUR BTL (IV SOLUTION) ×1 IMPLANT
PACK ANTERIOR HIP CUSTOM (KITS) ×1 IMPLANT
PENCIL SMOKE EVACUATOR (MISCELLANEOUS) IMPLANT
PROTECTOR NERVE ULNAR (MISCELLANEOUS) ×1 IMPLANT
RETRACTOR WND ALEXIS 18 MED (MISCELLANEOUS) ×1 IMPLANT
RTRCTR WOUND ALEXIS 18CM MED (MISCELLANEOUS) ×1
SPIKE FLUID TRANSFER (MISCELLANEOUS) ×1 IMPLANT
SROM M HEAD 36MM PLUS 1.5 (Hips) ×1 IMPLANT
SUT ETHIBOND NAB CT1 #1 30IN (SUTURE) ×2 IMPLANT
SUT VIC AB 1 CT1 36 (SUTURE) ×1 IMPLANT
SUT VIC AB 2-0 CT1 27 (SUTURE) ×1
SUT VIC AB 2-0 CT1 TAPERPNT 27 (SUTURE) ×1 IMPLANT
SUT VICRYL AB 3-0 FS1 BRD 27IN (SUTURE) ×1 IMPLANT
SUT VLOC 180 0 24IN GS25 (SUTURE) ×1 IMPLANT
SYR 50ML LL SCALE MARK (SYRINGE) ×1 IMPLANT
TRAY FOLEY MTR SLVR 16FR STAT (SET/KITS/TRAYS/PACK) ×1 IMPLANT
YANKAUER SUCT BULB TIP 10FT TU (MISCELLANEOUS) IMPLANT
YANKAUER SUCT BULB TIP NO VENT (SUCTIONS) IMPLANT

## 2022-03-12 NOTE — Progress Notes (Signed)
Pt attempted to go home following PT. During the session of PT, pt became dizzy, hypotensive and lightheaded. Pt laid back with bolus of LR infusing. Pt in NAD and continues to be monitored. Anesthesia MD and physician notified. Pt to be admitted. Orders to follow.

## 2022-03-12 NOTE — Plan of Care (Signed)
  Problem: Education: Goal: Knowledge of the prescribed therapeutic regimen will improve Outcome: Progressing   Problem: Activity: Goal: Ability to tolerate increased activity will improve Outcome: Progressing   Problem: Education: Goal: Knowledge of General Education information will improve Description: Including pain rating scale, medication(s)/side effects and non-pharmacologic comfort measures Outcome: Progressing   Problem: Pain Managment: Goal: General experience of comfort will improve Outcome: Progressing   Problem: Safety: Goal: Ability to remain free from injury will improve Outcome: Progressing

## 2022-03-12 NOTE — Evaluation (Signed)
Physical Therapy Evaluation Patient Details Name: Cory [name] MRN: [id number] DOB: Apr 07, 1936 Today's Date: 03/12/2022  History of Present Illness  86 yo male, S/P R THA through direct anterior approach on 03/12/22. PMH: HTN, GERD, prostatectomy,pre-diabetes, back surgery.  Clinical Impression  Patient  admitted for  above surgery.  Patient resting in bed  eating and chatting with family, HOB ~ 50*.  Last BP on monitor-136/87. HR 65, SPO2 100%.  Patient  performed heel slides on  RLE, PT noted bloody linens and in  periarea(post catheter removal) RN notified.  Assisted patient  to sit onto bed edge, within  a minute, patient reported feeling dizzy, assisted patient back to supine.  BP 90/57, then 98/58 with HOB flat. Raised HOB per pt request, increased dizziness complaints and darkness was coming over his eyes.BP 84/57. RN in to place in trendelenburg, notified medical. No further PT at this time today. Pt admitted with above diagnosis.  Pt currently with functional limitations due to the deficits listed below (see PT Problem List). Pt will benefit from skilled PT to increase their independence and safety with mobility to allow discharge to the venue listed below.            Recommendations for follow up therapy are one component of a multi-disciplinary discharge planning process, led by the attending physician.  Recommendations may be updated based on patient status, additional functional criteria and insurance authorization.  Follow Up Recommendations Follow physician's recommendations for discharge plan and follow up therapies (going to [facility])      Assistance Recommended at Discharge Frequent or constant Supervision/Assistance  Patient can return home with the following  A little help with walking and/or transfers;A little help with bathing/dressing/bathroom;Help with stairs or ramp for entrance;Assistance with cooking/housework;Assist for transportation    Equipment  Recommendations None recommended by PT  Recommendations for Other Services       Functional Status Assessment       Precautions / Restrictions Precautions Precautions: Fall Precaution Comments: hypotensive , bleedingfro peni post cath removal Restrictions Weight Bearing Restrictions: No      Mobility  Bed Mobility Overal bed mobility: Needs Assistance Bed Mobility: Supine to Sit, Sit to Supine     Supine to sit: HOB elevated, Min assist Sit to supine: Mod assist, +2 for safety/equipment, +2 for physical assistance   General bed mobility comments: min asssit to sit up, mod to return due to dizziness/safety    Transfers                   General transfer comment: NT, hypotensive    Ambulation/Gait                  Stairs            Wheelchair Mobility    Modified Rankin (Stroke Patients Only)       Balance Overall balance assessment: Needs assistance Sitting-balance support: Feet supported, Bilateral upper extremity supported Sitting balance-Leahy Scale: Fair Sitting balance - Comments: required more support right before returning to supine, when dizziness started.                                     Pertinent Vitals/Pain Pain Assessment Pain Assessment: 0-10 Pain Score: 5  Pain Location: right hip/thigh Pain Descriptors / Indicators: Discomfort, Pressure Pain Intervention(s): Monitored during session, Premedicated before session, Repositioned    Home Living Family/patient expects to be discharged  to:: Private residence Living Arrangements: Spouse/significant other;Children Available Help at Discharge: Available 24 hours/day;Family Type of Home: House Home Access: Stairs to enter Entrance Stairs-Rails: None Entrance Stairs-Number of Steps: 1 +1   Home Layout: One level Home Equipment: Conservation officer, nature (2 wheels);Rollator (4 wheels);Cane - single point      Prior Function Prior Level of Function :  Independent/Modified Independent                     Hand Dominance        Extremity/Trunk Assessment   Upper Extremity Assessment Upper Extremity Assessment: Overall WFL for tasks assessed    Lower Extremity Assessment Lower Extremity Assessment: RLE deficits/detail RLE Deficits / Details: able  heel slide in bed and slide leg to bed edge, assisted back onto bed  due to dizziness.    Cervical / Trunk Assessment Cervical / Trunk Assessment: Normal  Communication   Communication: No difficulties  Cognition Arousal/Alertness: Awake/alert Behavior During Therapy: WFL for tasks assessed/performed Overall Cognitive Status: Within Functional Limits for tasks assessed                                 General Comments: patient was talkative and cheerful , family  present , when PT arrived, after sitting on bed edge x 1 minute, C/O dizziness and "blackNess" coming over his eyes, became very quiet and somnolen but did respond whrn spoken to        General Comments      Exercises Total Joint Exercises Heel Slides: AAROM, Right, 10 reps, Supine   Assessment/Plan    PT Assessment Patient needs continued PT services  PT Problem List Decreased mobility;Cardiopulmonary status limiting activity;Decreased activity tolerance;Decreased knowledge of use of DME;Pain;Decreased range of motion;Decreased safety awareness       PT Treatment Interventions DME instruction;Therapeutic activities;Gait training;Therapeutic exercise;Patient/family education;Stair training;Functional mobility training    PT Goals (Current goals can be found in the Care Plan section)  Acute Rehab PT Goals Patient Stated Goal: go home PT Goal Formulation: With patient/family Time For Goal Achievement: 03/19/22 Potential to Achieve Goals: Good    Frequency 7X/week     Co-evaluation               AM-PAC PT "6 Clicks" Mobility  Outcome Measure Help needed turning from your back to  your side while in a flat bed without using bedrails?: A Little Help needed moving from lying on your back to sitting on the side of a flat bed without using bedrails?: A Lot Help needed moving to and from a bed to a chair (including a wheelchair)?: Total Help needed standing up from a chair using your arms (e.g., wheelchair or bedside chair)?: Total Help needed to walk in hospital room?: Total Help needed climbing 3-5 steps with a railing? : Total 6 Click Score: 9    End of Session Equipment Utilized During Treatment: Gait belt Activity Tolerance: Treatment limited secondary to medical complications (Comment) Patient left: in bed;with call bell/phone within reach;with nursing/sitter in room;with family/visitor present Nurse Communication: Mobility status (blood  on linens and dizziness/hypotension) PT Visit Diagnosis: Unsteadiness on feet (R26.81);Dizziness and giddiness (R42)    Time: 8563-1497 PT Time Calculation (min) (ACUTE ONLY): 30 min   Charges:   PT Evaluation $PT Eval Low Complexity: 1 Low PT Treatments $Therapeutic Activity: 8-22 mins        [place] Office  [place] Weekend [phone]   Claretha Cooper 03/12/2022, 1:40 PM

## 2022-03-12 NOTE — Care Management CC44 (Signed)
Condition Code 44 Documentation Completed  Patient Details  Name: Cory [name] MRN: [id number] Date of Birth: [birth year]   Condition Code 44 given:  Yes Patient signature on Condition Code 44 notice:  Yes Documentation of 2 MD's agreement:  Yes Code 44 added to claim:       Rodney Booze, LCSW 03/12/2022, 6:11 PM

## 2022-03-12 NOTE — Anesthesia Procedure Notes (Signed)
Spinal  Patient location during procedure: OR Start time: 03/12/2022 7:30 AM End time: 03/12/2022 7:36 AM Reason for block: surgical anesthesia Staffing Performed: anesthesiologist  Anesthesiologist: Murvin Natal, MD Performed by: Murvin Natal, MD Authorized by: Murvin Natal, MD   Preanesthetic Checklist Completed: patient identified, IV checked, risks and benefits discussed, surgical consent, monitors and equipment checked, pre-op evaluation and timeout performed Spinal Block Patient position: sitting Prep: DuraPrep Patient monitoring: cardiac monitor, continuous pulse ox and blood pressure Approach: midline Location: L4-5 Injection technique: single-shot Needle Needle type: Pencan  Needle gauge: 24 G Needle length: 9 cm Assessment Sensory level: T10 Events: CSF return Additional Notes Functioning IV was confirmed and monitors were applied. Sterile prep and drape, including hand hygiene and sterile gloves were used. The patient was positioned and the spine was prepped. The skin was anesthetized with lidocaine.  Free flow of clear CSF was obtained prior to injecting local anesthetic into the CSF.  The spinal needle aspirated freely following injection.  The needle was carefully withdrawn.  The patient tolerated the procedure well.

## 2022-03-12 NOTE — Transfer of Care (Signed)
Immediate Anesthesia Transfer of Care Note  Patient: Viraj Liby  Procedure(s) Performed: RIGHT TOTAL HIP ARTHROPLASTY ANTERIOR APPROACH (Right: Hip)  Patient Location: PACU  Anesthesia Type:Spinal  Level of Consciousness: drowsy  Airway & Oxygen Therapy: Patient Spontanous Breathing and Patient connected to face mask oxygen  Post-op Assessment: Report given to RN and Post -op Vital signs reviewed and stable  Post vital signs: Reviewed and stable  Last Vitals:  Vitals Value Taken Time  BP 113/78 03/12/22 0915  Temp    Pulse 79 03/12/22 0915  Resp 18 03/12/22 0915  SpO2 100 % 03/12/22 0915  Vitals shown include unvalidated device data.  Last Pain:  Vitals:   03/12/22 0630  TempSrc: Oral  PainSc:       Patients Stated Pain Goal: 4 (78/00/44 7158)  Complications: No notable events documented.

## 2022-03-12 NOTE — Care Management CC44 (Signed)
Condition Code 44 Documentation Completed  Patient Details  Name: Cory [name] MRN: [id number] Date of Birth: [birth year]   Condition Code 44 given:  Yes Patient signature on Condition Code 44 notice:  Yes Documentation of 2 MD's agreement:  Yes Code 44 added to claim:  Yes    Fuller Mandril, RN 03/12/2022, 10:24 PM

## 2022-03-12 NOTE — Anesthesia Procedure Notes (Signed)
Date/Time: 03/12/2022 7:30 AM  Performed by: Sharlette Dense, CRNAOxygen Delivery Method: Simple face mask

## 2022-03-12 NOTE — Care Management Important Message (Signed)
Important Message  Patient Details  Name: Cory [name] MRN: [id number] Date of Birth: [birth year]   Medicare Important Message Given:        Rodney Booze, LCSW 03/12/2022, 6:10 PM

## 2022-03-12 NOTE — Interval H&P Note (Signed)
History and Physical Interval Note:  03/12/2022 7:24 AM  Cory [name]  has presented today for surgery, with the diagnosis of RIGHT HIP DEGENERATIVE JOINT DISEASE.  The various methods of treatment have been discussed with the patient and family. After consideration of risks, benefits and other options for treatment, the patient has consented to  Procedure(s): RIGHT TOTAL HIP ARTHROPLASTY ANTERIOR APPROACH (Right) as a surgical intervention.  The patient's history has been reviewed, patient examined, no change in status, stable for surgery.  I have reviewed the patient's chart and labs.  Questions were answered to the patient's satisfaction.     Hessie Dibble

## 2022-03-12 NOTE — Social Work (Signed)
CODE 44 DONE PT agreed to the change in status.

## 2022-03-12 NOTE — Op Note (Signed)
PRE-OP DIAGNOSIS:  RIGHT HIP DEGENERATIVE JOINT DISEASE POST-OP DIAGNOSIS:  same PROCEDURE: RIGHT TOTAL HIP ARTHROPLASTY ANTERIOR APPROACH ANESTHESIA:  Spinal and MAC SURGEON:  Melrose Nakayama MD ASSISTANT:  Loni Dolly PA-C   INDICATIONS FOR PROCEDURE:  The patient is a 86 y.o. male with a long history of a painful hip.  This has persisted despite multiple conservative measures.  The patient has persisted with pain and dysfunction making rest and activity difficult.  A total hip replacement is offered as surgical treatment.  Informed operative consent was obtained after discussion of possible complications including reaction to anesthesia, infection, neurovascular injury, dislocation, DVT, PE, and death.  The importance of the postoperative rehab program to optimize result was stressed with the patient.  SUMMARY OF FINDINGS AND PROCEDURE:  Under the above anesthesia through a anterior approach an the Hana table a right THR was performed.  The patient had severe degenerative change and excellent bone quality.  We used DePuy components to replace the hip and these were size 4 high offset Actis femur capped with a +1.5 59m metal hip ball.  On the acetabular side we used a size 54 Gription shell with a  plus 4 neutral polyethylene liner.  We did use a hole eliminator.  ALoni DollyPA-C assisted throughout and was invaluable to the completion of the case in that he helped position and retract while I performed the procedure.  He also closed simultaneously to help minimize OR time.  I used fluoroscopy throughout the case to check position of implants and leg lengths and read all of these views myself.  DESCRIPTION OF PROCEDURE:  The patient was taken to the OR suite where the above anesthetic was applied.  The patient was then positioned on the Hana table supine.  All bony prominences were appropriately padded.  Prep and drape was then performed in normal sterile fashion.  The patient was given kefzol  preoperative antibiotic and an appropriate time out was performed.  We then took an anterior approach to the right hip.  Dissection was taken through adipose to the tensor fascia lata fascia.  This structure was incised longitudinally and we dissected in the intermuscular interval just medial to this muscle.  Cobra retractors were placed superior and inferior to the femoral neck superficial to the capsule.  A capsular incision was then made and the retractors were placed along the femoral neck.  Xray was brought in to get a good level for the femoral neck cut which was made with an oscillating saw and osteotome.  The femoral head was removed with a corkscrew.  The acetabulum was exposed and some labral tissues were excised. Reaming was taken to the inside wall of the pelvis and sequentially up to 1 mm smaller than the actual component.  A trial of components was done and then the aforementioned acetabular shell was placed in appropriate tilt and anteversion confirmed by fluoroscopy. The liner was placed along with the hole eliminator and attention was turned to the femur.  The leg was brought down and over into adduction and the elevator bar was used to raise the femur up gently in the wound.  The piriformis was released with care taken to preserve the obturator internus attachment and all of the posterior capsule. The femur was reamed and then broached to the appropriate size.  A trial reduction was done and the aforementioned head and neck assembly gave uKoreathe best stability in extension with external rotation.  Leg lengths were felt to be  about equal by fluoroscopic exam.  The trial components were removed and the wound irrigated.  We then placed the femoral component in appropriate anteversion.  The head was applied to a dry stem neck and the hip again reduced.  It was again stable in the aforementioned position.  The would was irrigated again followed by re-approximation of anterior capsule with ethibond  suture. Tensor fascia was repaired with V-loc suture  followed by deep closure with #O and #2 undyed vicryl.  Skin was closed with subQ stitch and steristrips followed by a sterile dressing.  EBL and IOF can be obtained from anesthesia records.  DISPOSITION:  The patient was taken to PACU in stable condition to potentially go home same day depending on ability to walk and tolerate liquids.

## 2022-03-12 NOTE — Care Management Obs Status (Signed)
[facility] NOTIFICATION   Patient Details  Name: Cory [name] MRN: [id number] Date of Birth: 1936-03-25   Medicare Observation Status Notification Given:       Rodney Booze, LCSW 03/12/2022, 6:11 PM

## 2022-03-12 NOTE — Anesthesia Postprocedure Evaluation (Signed)
Anesthesia Post Note  Patient: Cory [name]  Procedure(s) Performed: RIGHT TOTAL HIP ARTHROPLASTY ANTERIOR APPROACH (Right: Hip)     Patient location during evaluation: PACU Anesthesia Type: Spinal Level of consciousness: awake and alert Pain management: pain level controlled Vital Signs Assessment: post-procedure vital signs reviewed and stable Respiratory status: spontaneous breathing, nonlabored ventilation, respiratory function stable and patient connected to nasal cannula oxygen Cardiovascular status: stable and blood pressure returned to baseline Postop Assessment: no apparent nausea or vomiting and spinal receding Anesthetic complications: no Comments: Patient with an episode of orthostatic hypotension in Phase II. Surgeon notified and will admit patient.    No notable events documented.  Last Vitals:  Vitals:   03/12/22 1415 03/12/22 1430  BP: 126/76 127/81  Pulse: 71 75  Resp:    Temp:    SpO2: 100% 98%    Last Pain:  Vitals:   03/12/22 1330  TempSrc:   PainSc: 0-No pain                 Naftali Carchi P Karolyne Timmons

## 2022-03-13 ENCOUNTER — Encounter (HOSPITAL_COMMUNITY): Payer: Self-pay | Admitting: Orthopaedic Surgery

## 2022-03-13 DIAGNOSIS — Z8546 Personal history of malignant neoplasm of prostate: Secondary | ICD-10-CM | POA: Diagnosis not present

## 2022-03-13 DIAGNOSIS — Z79899 Other long term (current) drug therapy: Secondary | ICD-10-CM | POA: Diagnosis not present

## 2022-03-13 DIAGNOSIS — Z96642 Presence of left artificial hip joint: Secondary | ICD-10-CM | POA: Diagnosis not present

## 2022-03-13 DIAGNOSIS — M1611 Unilateral primary osteoarthritis, right hip: Secondary | ICD-10-CM | POA: Diagnosis not present

## 2022-03-13 DIAGNOSIS — I10 Essential (primary) hypertension: Secondary | ICD-10-CM | POA: Diagnosis not present

## 2022-03-13 DIAGNOSIS — Z7982 Long term (current) use of aspirin: Secondary | ICD-10-CM | POA: Diagnosis not present

## 2022-03-13 NOTE — Progress Notes (Signed)
Subjective: 1 Day Post-Op Procedure(s) (LRB): RIGHT TOTAL HIP ARTHROPLASTY ANTERIOR APPROACH (Right)  Patient doing great. Wife is bedside. He is hoping to go home today.  Activity level:  wbat Diet tolerance:  ok Voiding:  ok Patient reports pain as mild.    Objective: Vital signs in last 24 hours: Temp:  [97.2 F (36.2 C)-98.7 F (37.1 C)] 98.1 F (36.7 C) (09/13 0549) Pulse Rate:  [58-96] 83 (09/13 0549) Resp:  [12-18] 16 (09/13 0549) BP: (87-192)/(56-97) 143/92 (09/13 0549) SpO2:  [93 %-100 %] 99 % (09/13 0549)  Labs: No results for input(s): "HGB" in the last 72 hours. No results for input(s): "WBC", "RBC", "HCT", "PLT" in the last 72 hours. No results for input(s): "NA", "K", "CL", "CO2", "BUN", "CREATININE", "GLUCOSE", "CALCIUM" in the last 72 hours. No results for input(s): "LABPT", "INR" in the last 72 hours.  Physical Exam:  Neurologically intact ABD soft Neurovascular intact Sensation intact distally Intact pulses distally Dorsiflexion/Plantar flexion intact Incision: dressing C/D/I and no drainage No cellulitis present Compartment soft  Assessment/Plan:  1 Day Post-Op Procedure(s) (LRB): RIGHT TOTAL HIP ARTHROPLASTY ANTERIOR APPROACH (Right) Advance diet Up with therapy D/C IV fluids Discharge home with home health today after PT if cleared and doing well. Continue on '81mg'$  asa BID for dvt rpevention. Follow up in office 2 weeks post op.    Cory [name] 03/13/2022, 7:54 AM

## 2022-03-13 NOTE — Discharge Summary (Signed)
Patient ID: Cory [name] MRN: [id number] DOB/AGE: 84/12/1935 84 y.o.  Admit date: 03/12/2022 Discharge date: 03/13/2022  Admission Diagnoses:  Principal Problem:   Primary localized osteoarthritis of right hip Active Problems:   S/P total right hip arthroplasty   S/P total left hip arthroplasty   Discharge Diagnoses:  Same  Past Medical History:  Diagnosis Date   Arthritis    Difficult intubation 02/23/2014   Glidescope for anterior airway   Erythrocytosis    GERD (gastroesophageal reflux disease)    History of kidney stones    Hypertension 01/27/2012   Pre-diabetes    Prostate cancer ([place])    Thalassemia trait     Surgeries: Procedure(s): RIGHT TOTAL HIP ARTHROPLASTY ANTERIOR APPROACH on 03/12/2022   Consultants:   Discharged Condition: Improved  Hospital Course: [name] is an 86 y.o. male who was admitted 03/12/2022 for operative treatment ofPrimary localized osteoarthritis of right hip. Patient has severe unremitting pain that affects sleep, daily activities, and work/hobbies. After pre-op clearance the patient was taken to the operating room on 03/12/2022 and underwent  Procedure(s): RIGHT TOTAL HIP ARTHROPLASTY ANTERIOR APPROACH.    Patient was given perioperative antibiotics:  Anti-infectives (From admission, onward)    Start     Dose/Rate Route Frequency Ordered Stop   03/12/22 1528  ceFAZolin (ANCEF) 2-4 GM/100ML-% IVPB       Note to Pharmacy: Valinda Party C: cabinet override      03/12/22 1528 03/13/22 0344   03/12/22 1330  ceFAZolin (ANCEF) IVPB 2g/100 mL premix        2 g 200 mL/hr over 30 Minutes Intravenous Every 6 hours 03/12/22 0927 03/12/22 2137   03/12/22 0600  ceFAZolin (ANCEF) IVPB 2g/100 mL premix        2 g 200 mL/hr over 30 Minutes Intravenous On call to O.R. 03/12/22 0530 03/12/22 0742        Patient was given sequential compression devices, early ambulation, and chemoprophylaxis to prevent DVT.  Patient benefited maximally  from hospital stay and there were no complications.    Recent vital signs: Patient Vitals for the past 24 hrs:  BP Temp Temp src Pulse Resp SpO2  03/13/22 0549 (!) 143/92 98.1 F (36.7 C) Oral 83 16 99 %  03/13/22 0023 (!) 150/77 98.7 F (37.1 C) Oral 82 16 98 %  03/12/22 2122 (!) 183/80 -- -- 96 -- 97 %  03/12/22 2121 (!) 192/91 98.6 F (37 C) Oral 93 18 98 %  03/12/22 1908 (!) 158/77 98 F (36.7 C) Oral 79 16 100 %  03/12/22 1708 (!) 172/97 97.7 F (36.5 C) Oral 90 16 100 %  03/12/22 1630 (!) 157/77 -- -- 90 -- 100 %  03/12/22 1515 135/69 -- -- 81 -- 100 %  03/12/22 1500 (!) 152/77 -- -- (!) 58 -- 100 %  03/12/22 1445 131/76 -- -- 74 -- 96 %  03/12/22 1430 127/81 -- -- 75 -- 98 %  03/12/22 1415 126/76 -- -- 71 -- 100 %  03/12/22 1400 111/67 -- -- 74 -- 98 %  03/12/22 1345 124/73 -- -- 79 -- 95 %  03/12/22 1330 103/67 -- -- 69 -- 96 %  03/12/22 1320 (!) 87/56 -- -- 66 -- 100 %  03/12/22 1315 (!) 98/58 -- -- 63 -- 95 %  03/12/22 1300 (!) 158/91 -- -- 93 -- 100 %  03/12/22 1200 (!) 144/86 -- -- 92 -- 97 %  03/12/22 1120 136/87 -- -- 83 -- 95 %  03/12/22 1100 (!) 141/84 -- -- 89 18 94 %  03/12/22 1045 131/77 -- -- 74 14 95 %  03/12/22 1030 133/78 -- -- 77 15 96 %  03/12/22 1015 -- -- -- 88 13 93 %  03/12/22 1000 118/75 -- -- 74 18 93 %  03/12/22 0945 -- -- -- 80 15 97 %  03/12/22 0930 118/67 (!) 97.2 F (36.2 C) -- 76 12 100 %  03/12/22 0915 113/78 (!) 97.5 F (36.4 C) -- 76 17 100 %     Recent laboratory studies: No results for input(s): "WBC", "HGB", "HCT", "PLT", "NA", "K", "CL", "CO2", "BUN", "CREATININE", "GLUCOSE", "INR", "CALCIUM" in the last 72 hours.  Invalid input(s): "PT", "2"   Discharge Medications:   Allergies as of 03/13/2022   No Known Allergies      Medication List     STOP taking these medications    celecoxib 100 MG capsule Commonly known as: CeleBREX       TAKE these medications    amLODipine 5 MG tablet Commonly known as:  NORVASC Take 5 mg by mouth in the morning.   aspirin EC 81 MG tablet Take 1 tablet (81 mg total) by mouth 2 (two) times daily after a meal. For 2 weeks then once a day for 2 weeks for DVTprevention. What changed:  when to take this additional instructions   atorvastatin 40 MG tablet Commonly known as: LIPITOR Take 40 mg by mouth in the morning.   benazepril 40 MG tablet Commonly known as: LOTENSIN Take 40 mg by mouth in the morning.   clotrimazole 1 % cream Commonly known as: LOTRIMIN Apply 1 Application topically daily as needed (athlete's foot).   HYDROcodone-acetaminophen 5-325 MG tablet Commonly known as: NORCO/VICODIN Take 1 tablet by mouth every 6 (six) hours as needed for moderate pain or severe pain (post op pain).   oxybutynin 10 MG 24 hr tablet Commonly known as: DITROPAN-XL Take 10 mg by mouth in the morning.   tiZANidine 2 MG tablet Commonly known as: ZANAFLEX Take 1 tablet (2 mg total) by mouth every 6 (six) hours as needed for muscle spasms.               Durable Medical Equipment  (From admission, onward)           Start     Ordered   03/12/22 1658  DME Walker rolling  Once       Question:  Patient needs a walker to treat with the following condition  Answer:  Primary osteoarthritis of right hip   03/12/22 1657   03/12/22 1658  DME 3 n 1  Once        03/12/22 1657   03/12/22 1658  DME Bedside commode  Once       Question:  Patient needs a bedside commode to treat with the following condition  Answer:  Primary osteoarthritis of right hip   03/12/22 1657            Diagnostic Studies: DG HIP UNILAT WITH PELVIS 2-3 VIEWS RIGHT  Result Date: 03/12/2022 CLINICAL DATA:  Total right hip arthroplasty. EXAM: DG HIP (WITH OR WITHOUT PELVIS) 2-3V RIGHT COMPARISON:  Pelvis and right hip radiographs 06/12/2021 FINDINGS: Images were performed intraoperatively without the presence of a radiologist. Insert total right hip arthroplasty. No evidence of  hardware complication. Total fluoroscopy images: 2 Total fluoroscopy time: 27 seconds Total dose: Radiation Exposure Index (as provided by the fluoroscopic device): 2.678 mGy air Kerma  Please see intraoperative findings for further detail. IMPRESSION: Intraoperative fluoroscopy for right hip arthroplasty. Electronically Signed   By: Yvonne Kendall M.D.   On: 03/12/2022 09:04   DG C-Arm 1-60 Min-No Report  Result Date: 03/12/2022 Fluoroscopy was utilized by the requesting physician.  No radiographic interpretation.   DG Chest 2 View  Result Date: 03/05/2022 CLINICAL DATA:  Preop for right hip surgery. History of hypertension. EXAM: CHEST - 2 VIEW COMPARISON:  March 23, 2021, January 26, 2014 FINDINGS: The heart size and mediastinal contours are within normal limits. Both lungs are clear. 1 cm focal sclerosis is identified in the right inferior scapula unchanged compared prior exams. IMPRESSION: No active cardiopulmonary disease. Electronically Signed   By: Abelardo Diesel M.D.   On: 03/05/2022 14:09    Disposition: Discharge disposition: 01-Home or Self Care       Discharge Instructions     Call MD / Call 911   Complete by: As directed    If you experience chest pain or shortness of breath, CALL 911 and be transported to the hospital emergency room.  If you develope a fever above 101 F, pus (white drainage) or increased drainage or redness at the wound, or calf pain, call your surgeon's office.   Call MD / Call 911   Complete by: As directed    If you experience chest pain or shortness of breath, CALL 911 and be transported to the hospital emergency room.  If you develope a fever above 101 F, pus (white drainage) or increased drainage or redness at the wound, or calf pain, call your surgeon's office.   Constipation Prevention   Complete by: As directed    Drink plenty of fluids.  Prune juice may be helpful.  You may use a stool softener, such as Colace (over the counter) 100 mg twice a day.   Use MiraLax (over the counter) for constipation as needed.   Constipation Prevention   Complete by: As directed    Drink plenty of fluids.  Prune juice may be helpful.  You may use a stool softener, such as Colace (over the counter) 100 mg twice a day.  Use MiraLax (over the counter) for constipation as needed.   Diet - low sodium heart healthy   Complete by: As directed    Diet - low sodium heart healthy   Complete by: As directed    Discharge instructions   Complete by: As directed    INSTRUCTIONS AFTER JOINT REPLACEMENT   Remove items at home which could result in a fall. This includes throw rugs or furniture in walking pathways ICE to the affected joint every three hours while awake for 30 minutes at a time, for at least the first 3-5 days, and then as needed for pain and swelling.  Continue to use ice for pain and swelling. You may notice swelling that will progress down to the foot and ankle.  This is normal after surgery.  Elevate your leg when you are not up walking on it.   Continue to use the breathing machine you got in the hospital (incentive spirometer) which will help keep your temperature down.  It is common for your temperature to cycle up and down following surgery, especially at night when you are not up moving around and exerting yourself.  The breathing machine keeps your lungs expanded and your temperature down.   DIET:  As you were doing prior to hospitalization, we recommend a well-balanced diet.  DRESSING / WOUND  CARE / SHOWERING  You may shower 3 days after surgery, but keep the wounds dry during showering.  You may use an occlusive plastic wrap (Press'n Seal for example), NO SOAKING/SUBMERGING IN THE BATHTUB.  If the bandage gets wet, change with a clean dry gauze.  If the incision gets wet, pat the wound dry with a clean towel.  ACTIVITY  Increase activity slowly as tolerated, but follow the weight bearing instructions below.   No driving for 6 weeks or until  further direction given by your physician.  You cannot drive while taking narcotics.  No lifting or carrying greater than 10 lbs. until further directed by your surgeon. Avoid periods of inactivity such as sitting longer than an hour when not asleep. This helps prevent blood clots.  You may return to work once you are authorized by your doctor.     WEIGHT BEARING   Weight bearing as tolerated with assist device (walker, cane, etc) as directed, use it as long as suggested by your surgeon or therapist, typically at least 4-6 weeks.   EXERCISES  Results after joint replacement surgery are often greatly improved when you follow the exercise, range of motion and muscle strengthening exercises prescribed by your doctor. Safety measures are also important to protect the joint from further injury. Any time any of these exercises cause you to have increased pain or swelling, decrease what you are doing until you are comfortable again and then slowly increase them. If you have problems or questions, call your caregiver or physical therapist for advice.   Rehabilitation is important following a joint replacement. After just a few days of immobilization, the muscles of the leg can become weakened and shrink (atrophy).  These exercises are designed to build up the tone and strength of the thigh and leg muscles and to improve motion. Often times heat used for twenty to thirty minutes before working out will loosen up your tissues and help with improving the range of motion but do not use heat for the first two weeks following surgery (sometimes heat can increase post-operative swelling).   These exercises can be done on a training (exercise) mat, on the floor, on a table or on a bed. Use whatever works the best and is most comfortable for you.    Use music or television while you are exercising so that the exercises are a pleasant break in your day. This will make your life better with the exercises acting as a  break in your routine that you can look forward to.   Perform all exercises about fifteen times, three times per day or as directed.  You should exercise both the operative leg and the other leg as well.  Exercises include:   Quad Sets - Tighten up the muscle on the front of the thigh (Quad) and hold for 5-10 seconds.   Straight Leg Raises - With your knee straight (if you were given a brace, keep it on), lift the leg to 60 degrees, hold for 3 seconds, and slowly lower the leg.  Perform this exercise against resistance later as your leg gets stronger.  Leg Slides: Lying on your back, slowly slide your foot toward your buttocks, bending your knee up off the floor (only go as far as is comfortable). Then slowly slide your foot back down until your leg is flat on the floor again.  Angel Wings: Lying on your back spread your legs to the side as far apart as you can without causing  discomfort.  Hamstring Strength:  Lying on your back, push your heel against the floor with your leg straight by tightening up the muscles of your buttocks.  Repeat, but this time bend your knee to a comfortable angle, and push your heel against the floor.  You may put a pillow under the heel to make it more comfortable if necessary.   A rehabilitation program following joint replacement surgery can speed recovery and prevent re-injury in the future due to weakened muscles. Contact your doctor or a physical therapist for more information on knee rehabilitation.    CONSTIPATION  Constipation is defined medically as fewer than three stools per week and severe constipation as less than one stool per week.  Even if you have a regular bowel pattern at home, your normal regimen is likely to be disrupted due to multiple reasons following surgery.  Combination of anesthesia, postoperative narcotics, change in appetite and fluid intake all can affect your bowels.   YOU MUST use at least one of the following options; they are listed in  order of increasing strength to get the job done.  They are all available over the counter, and you may need to use some, POSSIBLY even all of these options:    Drink plenty of fluids (prune juice may be helpful) and high fiber foods Colace 100 mg by mouth twice a day  Senokot for constipation as directed and as needed Dulcolax (bisacodyl), take with full glass of water  Miralax (polyethylene glycol) once or twice a day as needed.  If you have tried all these things and are unable to have a bowel movement in the first 3-4 days after surgery call either your surgeon or your primary doctor.    If you experience loose stools or diarrhea, hold the medications until you stool forms back up.  If your symptoms do not get better within 1 week or if they get worse, check with your doctor.  If you experience "the worst abdominal pain ever" or develop nausea or vomiting, please contact the office immediately for further recommendations for treatment.   ITCHING:  If you experience itching with your medications, try taking only a single pain pill, or even half a pain pill at a time.  You can also use Benadryl over the counter for itching or also to help with sleep.   TED HOSE STOCKINGS:  Use stockings on both legs until for at least 2 weeks or as directed by physician office. They may be removed at night for sleeping.  MEDICATIONS:  See your medication summary on the "After Visit Summary" that nursing will review with you.  You may have some home medications which will be placed on hold until you complete the course of blood thinner medication.  It is important for you to complete the blood thinner medication as prescribed.  PRECAUTIONS:  If you experience chest pain or shortness of breath - call 911 immediately for transfer to the hospital emergency department.   If you develop a fever greater that 101 F, purulent drainage from wound, increased redness or drainage from wound, foul odor from the  wound/dressing, or calf pain - CONTACT YOUR SURGEON.                                                   FOLLOW-UP APPOINTMENTS:  If you do  not already have a post-op appointment, please call the office for an appointment to be seen by your surgeon.  Guidelines for how soon to be seen are listed in your "After Visit Summary", but are typically between 1-4 weeks after surgery.  OTHER INSTRUCTIONS:   Knee Replacement:  Do not place pillow under knee, focus on keeping the knee straight while resting. CPM instructions: 0-90 degrees, 2 hours in the morning, 2 hours in the afternoon, and 2 hours in the evening. Place foam block, curve side up under heel at all times except when in CPM or when walking.  DO NOT modify, tear, cut, or change the foam block in any way.  POST-OPERATIVE OPIOID TAPER INSTRUCTIONS: It is important to wean off of your opioid medication as soon as possible. If you do not need pain medication after your surgery it is ok to stop day one. Opioids include: Codeine, Hydrocodone(Norco, Vicodin), Oxycodone(Percocet, oxycontin) and hydromorphone amongst others.  Long term and even short term use of opiods can cause: Increased pain response Dependence Constipation Depression Respiratory depression And more.  Withdrawal symptoms can include Flu like symptoms Nausea, vomiting And more Techniques to manage these symptoms Hydrate well Eat regular healthy meals Stay active Use relaxation techniques(deep breathing, meditating, yoga) Do Not substitute Alcohol to help with tapering If you have been on opioids for less than two weeks and do not have pain than it is ok to stop all together.  Plan to wean off of opioids This plan should start within one week post op of your joint replacement. Maintain the same interval or time between taking each dose and first decrease the dose.  Cut the total daily intake of opioids by one tablet each day Next start to increase the time between  doses. The last dose that should be eliminated is the evening dose.     MAKE SURE YOU:  Understand these instructions.  Get help right away if you are not doing well or get worse.    Thank you for letting us be a part of your medical care team.  It is a privilege we respect greatly.  We hope these instructions will help you stay on track for a fast and full recovery!   Discharge instructions   Complete by: As directed    INSTRUCTIONS AFTER JOINT REPLACEMENT   Remove items at home which could result in a fall. This includes throw rugs or furniture in walking pathways ICE to the affected joint every three hours while awake for 30 minutes at a time, for at least the first 3-5 days, and then as needed for pain and swelling.  Continue to use ice for pain and swelling. You may notice swelling that will progress down to the foot and ankle.  This is normal after surgery.  Elevate your leg when you are not up walking on it.   Continue to use the breathing machine you got in the hospital (incentive spirometer) which will help keep your temperature down.  It is common for your temperature to cycle up and down following surgery, especially at night when you are not up moving around and exerting yourself.  The breathing machine keeps your lungs expanded and your temperature down.   DIET:  As you were doing prior to hospitalization, we recommend a well-balanced diet.  DRESSING / WOUND CARE / SHOWERING  You may shower 3 days after surgery, but keep the wounds dry during showering.  You may use an occlusive plastic wrap (Press'n Seal  for example), NO SOAKING/SUBMERGING IN THE BATHTUB.  If the bandage gets wet, change with a clean dry gauze.  If the incision gets wet, pat the wound dry with a clean towel.  ACTIVITY  Increase activity slowly as tolerated, but follow the weight bearing instructions below.   No driving for 6 weeks or until further direction given by your physician.  You cannot drive while  taking narcotics.  No lifting or carrying greater than 10 lbs. until further directed by your surgeon. Avoid periods of inactivity such as sitting longer than an hour when not asleep. This helps prevent blood clots.  You may return to work once you are authorized by your doctor.     WEIGHT BEARING   Weight bearing as tolerated with assist device (walker, cane, etc) as directed, use it as long as suggested by your surgeon or therapist, typically at least 4-6 weeks.   EXERCISES  Results after joint replacement surgery are often greatly improved when you follow the exercise, range of motion and muscle strengthening exercises prescribed by your doctor. Safety measures are also important to protect the joint from further injury. Any time any of these exercises cause you to have increased pain or swelling, decrease what you are doing until you are comfortable again and then slowly increase them. If you have problems or questions, call your caregiver or physical therapist for advice.   Rehabilitation is important following a joint replacement. After just a few days of immobilization, the muscles of the leg can become weakened and shrink (atrophy).  These exercises are designed to build up the tone and strength of the thigh and leg muscles and to improve motion. Often times heat used for twenty to thirty minutes before working out will loosen up your tissues and help with improving the range of motion but do not use heat for the first two weeks following surgery (sometimes heat can increase post-operative swelling).   These exercises can be done on a training (exercise) mat, on the floor, on a table or on a bed. Use whatever works the best and is most comfortable for you.    Use music or television while you are exercising so that the exercises are a pleasant break in your day. This will make your life better with the exercises acting as a break in your routine that you can look forward to.   Perform all  exercises about fifteen times, three times per day or as directed.  You should exercise both the operative leg and the other leg as well.  Exercises include:   Quad Sets - Tighten up the muscle on the front of the thigh (Quad) and hold for 5-10 seconds.   Straight Leg Raises - With your knee straight (if you were given a brace, keep it on), lift the leg to 60 degrees, hold for 3 seconds, and slowly lower the leg.  Perform this exercise against resistance later as your leg gets stronger.  Leg Slides: Lying on your back, slowly slide your foot toward your buttocks, bending your knee up off the floor (only go as far as is comfortable). Then slowly slide your foot back down until your leg is flat on the floor again.  Angel Wings: Lying on your back spread your legs to the side as far apart as you can without causing discomfort.  Hamstring Strength:  Lying on your back, push your heel against the floor with your leg straight by tightening up the muscles of your buttocks.  Repeat, but this time bend your knee to a comfortable angle, and push your heel against the floor.  You may put a pillow under the heel to make it more comfortable if necessary.   A rehabilitation program following joint replacement surgery can speed recovery and prevent re-injury in the future due to weakened muscles. Contact your doctor or a physical therapist for more information on knee rehabilitation.    CONSTIPATION  Constipation is defined medically as fewer than three stools per week and severe constipation as less than one stool per week.  Even if you have a regular bowel pattern at home, your normal regimen is likely to be disrupted due to multiple reasons following surgery.  Combination of anesthesia, postoperative narcotics, change in appetite and fluid intake all can affect your bowels.   YOU MUST use at least one of the following options; they are listed in order of increasing strength to get the job done.  They are all  available over the counter, and you may need to use some, POSSIBLY even all of these options:    Drink plenty of fluids (prune juice may be helpful) and high fiber foods Colace 100 mg by mouth twice a day  Senokot for constipation as directed and as needed Dulcolax (bisacodyl), take with full glass of water  Miralax (polyethylene glycol) once or twice a day as needed.  If you have tried all these things and are unable to have a bowel movement in the first 3-4 days after surgery call either your surgeon or your primary doctor.    If you experience loose stools or diarrhea, hold the medications until you stool forms back up.  If your symptoms do not get better within 1 week or if they get worse, check with your doctor.  If you experience "the worst abdominal pain ever" or develop nausea or vomiting, please contact the office immediately for further recommendations for treatment.   ITCHING:  If you experience itching with your medications, try taking only a single pain pill, or even half a pain pill at a time.  You can also use Benadryl over the counter for itching or also to help with sleep.   TED HOSE STOCKINGS:  Use stockings on both legs until for at least 2 weeks or as directed by physician office. They may be removed at night for sleeping.  MEDICATIONS:  See your medication summary on the "After Visit Summary" that nursing will review with you.  You may have some home medications which will be placed on hold until you complete the course of blood thinner medication.  It is important for you to complete the blood thinner medication as prescribed.  PRECAUTIONS:  If you experience chest pain or shortness of breath - call 911 immediately for transfer to the hospital emergency department.   If you develop a fever greater that 101 F, purulent drainage from wound, increased redness or drainage from wound, foul odor from the wound/dressing, or calf pain - CONTACT YOUR SURGEON.                                                    FOLLOW-UP APPOINTMENTS:  If you do not already have a post-op appointment, please call the office for an appointment to be seen by your surgeon.  Guidelines for how soon to be seen are  listed in your "After Visit Summary", but are typically between 1-4 weeks after surgery.  OTHER INSTRUCTIONS:   Knee Replacement:  Do not place pillow under knee, focus on keeping the knee straight while resting. CPM instructions: 0-90 degrees, 2 hours in the morning, 2 hours in the afternoon, and 2 hours in the evening. Place foam block, curve side up under heel at all times except when in CPM or when walking.  DO NOT modify, tear, cut, or change the foam block in any way.  POST-OPERATIVE OPIOID TAPER INSTRUCTIONS: It is important to wean off of your opioid medication as soon as possible. If you do not need pain medication after your surgery it is ok to stop day one. Opioids include: Codeine, Hydrocodone(Norco, Vicodin), Oxycodone(Percocet, oxycontin) and hydromorphone amongst others.  Long term and even short term use of opiods can cause: Increased pain response Dependence Constipation Depression Respiratory depression And more.  Withdrawal symptoms can include Flu like symptoms Nausea, vomiting And more Techniques to manage these symptoms Hydrate well Eat regular healthy meals Stay active Use relaxation techniques(deep breathing, meditating, yoga) Do Not substitute Alcohol to help with tapering If you have been on opioids for less than two weeks and do not have pain than it is ok to stop all together.  Plan to wean off of opioids This plan should start within one week post op of your joint replacement. Maintain the same interval or time between taking each dose and first decrease the dose.  Cut the total daily intake of opioids by one tablet each day Next start to increase the time between doses. The last dose that should be eliminated is the evening dose.     MAKE  SURE YOU:  Understand these instructions.  Get help right away if you are not doing well or get worse.    Thank you for letting us be a part of your medical care team.  It is a privilege we respect greatly.  We hope these instructions will help you stay on track for a fast and full recovery!   Increase activity slowly as tolerated   Complete by: As directed    Increase activity slowly as tolerated   Complete by: As directed    Post-operative opioid taper instructions:   Complete by: As directed    POST-OPERATIVE OPIOID TAPER INSTRUCTIONS: It is important to wean off of your opioid medication as soon as possible. If you do not need pain medication after your surgery it is ok to stop day one. Opioids include: Codeine, Hydrocodone(Norco, Vicodin), Oxycodone(Percocet, oxycontin) and hydromorphone amongst others.  Long term and even short term use of opiods can cause: Increased pain response Dependence Constipation Depression Respiratory depression And more.  Withdrawal symptoms can include Flu like symptoms Nausea, vomiting And more Techniques to manage these symptoms Hydrate well Eat regular healthy meals Stay active Use relaxation techniques(deep breathing, meditating, yoga) Do Not substitute Alcohol to help with tapering If you have been on opioids for less than two weeks and do not have pain than it is ok to stop all together.  Plan to wean off of opioids This plan should start within one week post op of your joint replacement. Maintain the same interval or time between taking each dose and first decrease the dose.  Cut the total daily intake of opioids by one tablet each day Next start to increase the time between doses. The last dose that should be eliminated is the evening dose.  Post-operative opioid taper instructions:   Complete by: As directed    POST-OPERATIVE OPIOID TAPER INSTRUCTIONS: It is important to wean off of your opioid medication as soon as possible.  If you do not need pain medication after your surgery it is ok to stop day one. Opioids include: Codeine, Hydrocodone(Norco, Vicodin), Oxycodone(Percocet, oxycontin) and hydromorphone amongst others.  Long term and even short term use of opiods can cause: Increased pain response Dependence Constipation Depression Respiratory depression And more.  Withdrawal symptoms can include Flu like symptoms Nausea, vomiting And more Techniques to manage these symptoms Hydrate well Eat regular healthy meals Stay active Use relaxation techniques(deep breathing, meditating, yoga) Do Not substitute Alcohol to help with tapering If you have been on opioids for less than two weeks and do not have pain than it is ok to stop all together.  Plan to wean off of opioids This plan should start within one week post op of your joint replacement. Maintain the same interval or time between taking each dose and first decrease the dose.  Cut the total daily intake of opioids by one tablet each day Next start to increase the time between doses. The last dose that should be eliminated is the evening dose.           Follow-up Information     Melrose Nakayama, MD. Go on 03/22/2022.   Specialty: Orthopedic Surgery Why: Your appointment is scheduled for 10:00 Contact information: [place] [address] [place]. Go on 03/14/2022.   Why: your outpatient physical therapy appointment is scheduled fro 2:00. Contact information: [address] [place] [address] [phone]                  Signed: Larwance Sachs Mariadejesus Cade 03/13/2022, 7:55 AM

## 2022-03-13 NOTE — Progress Notes (Signed)
Physical Therapy Treatment Patient Details Name: Cory [name] MRN: [id number] DOB: June 05, 1936 Today's Date: 03/13/2022   History of Present Illness 86 yo male, S/P R THA through direct anterior approach on 03/12/22. PMH: HTN, GERD, prostatectomy,pre-diabetes, back surgery.    PT Comments    Patient feeling well, no BP issues during mobility. PT goals met for Dc home. Spouse present for instruction on HEP and 1 step.   Recommendations for follow up therapy are one component of a multi-disciplinary discharge planning process, led by the attending physician.  Recommendations may be updated based on patient status, additional functional criteria and insurance authorization.  Follow Up Recommendations  Follow physician's recommendations for discharge plan and follow up therapies     Assistance Recommended at Discharge Set up Supervision/Assistance  Patient can return home with the following A little help with walking and/or transfers;A little help with bathing/dressing/bathroom;Help with stairs or ramp for entrance;Assistance with cooking/housework;Assist for transportation   Equipment Recommendations  None recommended by PT    Recommendations for Other Services       Precautions / Restrictions Precautions Precautions: Fall     Mobility  Bed Mobility   Bed Mobility: Supine to Sit     Supine to sit: Supervision          Transfers Overall transfer level: Needs assistance Equipment used: Rolling walker (2 wheels) Transfers: Sit to/from Stand Sit to Stand: Min guard           General transfer comment: cues for safety and hand and right leg position    Ambulation/Gait Ambulation/Gait assistance: Min guard Gait Distance (Feet): 200 Feet Assistive device: Rolling walker (2 wheels) Gait Pattern/deviations: Step-through pattern       General Gait Details: gait smoothe and steady   Stairs Stairs: Yes Stairs assistance: Min guard Stair Management: Forwards,  Step to pattern, With walker Number of Stairs: 1 General stair comments: wife present for instruction   Wheelchair Mobility    Modified Rankin (Stroke Patients Only)       Balance                                            Cognition Arousal/Alertness: Awake/alert Behavior During Therapy: WFL for tasks assessed/performed Overall Cognitive Status: Within Functional Limits for tasks assessed                                          Exercises Total Joint Exercises Ankle Circles/Pumps: AROM, Both, 10 reps Quad Sets: AROM, Both, 10 reps Short Arc Quad: AROM, Right, 10 reps Heel Slides: AROM, Right, 10 reps Hip ABduction/ADduction: AROM, Right, 10 reps    General Comments        Pertinent Vitals/Pain Pain Assessment Pain Score: 2  Pain Location: right hip/thigh Pain Descriptors / Indicators: Sore Pain Intervention(s): Monitored during session, Premedicated before session    Home Living                          Prior Function            PT Goals (current goals can now be found in the care plan section) Progress towards PT goals: Progressing toward goals    Frequency    7X/week      PT  Plan Current plan remains appropriate    Co-evaluation              AM-PAC PT "6 Clicks" Mobility   Outcome Measure  Help needed turning from your back to your side while in a flat bed without using bedrails?: None Help needed moving from lying on your back to sitting on the side of a flat bed without using bedrails?: None Help needed moving to and from a bed to a chair (including a wheelchair)?: A Little Help needed standing up from a chair using your arms (e.g., wheelchair or bedside chair)?: A Little Help needed to walk in hospital room?: A Little Help needed climbing 3-5 steps with a railing? : A Little 6 Click Score: 20    End of Session Equipment Utilized During Treatment: Gait belt Activity Tolerance: Patient  tolerated treatment well Patient left: in chair;with call bell/phone within reach;with family/visitor present Nurse Communication: Mobility status PT Visit Diagnosis: Unsteadiness on feet (R26.81);Dizziness and giddiness (R42)     Time: 2182-8833 PT Time Calculation (min) (ACUTE ONLY): 25 min  Charges:  $Gait Training: 8-22 mins $Therapeutic Exercise: 8-22 mins                     [place] Office [phone] Weekend [phone]    Claretha Cooper 03/13/2022, 1:04 PM

## 2022-03-13 NOTE — TOC Initial Note (Signed)
Transition of Care [facility]) - Initial/Assessment Note    Patient Details  Name: Cory [name] MRN: [id number] Date of Birth: 09-27-1935  Transition of Care (TOC) CM/SW Contact:    Joaquin Courts, RN Phone Number: 03/13/2022, 10:33 AM  Clinical Narrative:                 Patient plans to discharge home with OPPT, has all necessary dme.  No further TOC needs identified.  Expected Discharge Plan: OP Rehab Barriers to Discharge: No Barriers Identified   Patient Goals and CMS Choice Patient states their goals for this hospitalization and ongoing recovery are:: to go home      Expected Discharge Plan and Services Expected Discharge Plan: OP Rehab   Discharge Planning Services: CM Consult     Expected Discharge Date: 03/13/22               DME Arranged: N/A DME Agency: NA       HH Arranged: NA          Prior Living Arrangements/Services     Patient language and need for interpreter reviewed:: Yes Do you feel safe going back to the place where you live?: Yes      Need for Family Participation in Patient Care: Yes (Comment) Care giver support system in place?: Yes (comment)   Criminal Activity/Legal Involvement Pertinent to Current Situation/Hospitalization: No - Comment as needed  Activities of Daily Living Home Assistive Devices/Equipment: Gilford Rile (specify type) ADL Screening (condition at time of admission) Patient's cognitive ability adequate to safely complete daily activities?: Yes Is the patient deaf or have difficulty hearing?: Yes Does the patient have difficulty seeing, even when wearing glasses/contacts?: Yes Does the patient have difficulty concentrating, remembering, or making decisions?: No Patient able to express need for assistance with ADLs?: Yes Does the patient have difficulty dressing or bathing?: No Independently performs ADLs?: Yes (appropriate for developmental age) Does the patient have difficulty walking or climbing stairs?:  Yes Weakness of Legs: None Weakness of Arms/Hands: None  Permission Sought/Granted                  Emotional Assessment Appearance:: Appears stated age Attitude/Demeanor/Rapport: Engaged Affect (typically observed): Accepting Orientation: : Oriented to Self, Oriented to Place, Oriented to  Time, Oriented to Situation   Psych Involvement: No (comment)  Admission diagnosis:  S/P total right hip arthroplasty [Z96.641] S/P total left hip arthroplasty [Y63.785] Patient Active Problem List   Diagnosis Date Noted   Primary localized osteoarthritis of right hip 03/12/2022   S/P total right hip arthroplasty 03/12/2022   S/P total left hip arthroplasty 03/12/2022   Right shoulder pain 06/12/2021   Degenerative cervical disc 05/09/2021   AC (acromioclavicular) arthritis 05/09/2021   Whiplash injuries, initial encounter 03/28/2021   Arthritis of right hip 02/14/2021   Gastroesophageal reflux disease 12/25/2020   Erectile dysfunction due to arterial insufficiency 12/25/2020   Malignant tumor of prostate ([place]) 12/25/2020   Osteoarthritis 12/25/2020   Prediabetes 12/25/2020   Pure hypercholesterolemia 12/25/2020   Transient ischemic attack 12/25/2020   Greater trochanteric bursitis of right hip 12/25/2020   History of prostate cancer 02/23/2014   Cardiac arrhythmia 02/23/2014   Erythrocytosis 05/12/2013   Leukopenia 01/27/2012   Polycythemia 01/27/2012   Essential hypertension 01/27/2012   PCP:  Mckinley Jewel, MD Pharmacy:   [facility], [place], [address], [place] [address] Phone: [phone] Fax: [phone]  [id number] [address], [place] [address] [place] [address] Phone: [phone] Fax: [phone]     Social Determinants of Health (SDOH) Interventions    Readmission Risk Interventions     No data to  display

## 2022-03-13 NOTE — Plan of Care (Signed)
  Problem: Education: Goal: Knowledge of the prescribed therapeutic regimen will improve Outcome: Progressing   Problem: Education: Goal: Knowledge of General Education information will improve Description: Including pain rating scale, medication(s)/side effects and non-pharmacologic comfort measures Outcome: Progressing   Problem: Activity: Goal: Risk for activity intolerance will decrease Outcome: Progressing   Problem: Pain Managment: Goal: General experience of comfort will improve Outcome: Progressing

## 2022-03-15 DIAGNOSIS — R2689 Other abnormalities of gait and mobility: Secondary | ICD-10-CM | POA: Diagnosis not present

## 2022-03-15 DIAGNOSIS — M25551 Pain in right hip: Secondary | ICD-10-CM | POA: Diagnosis not present

## 2022-03-19 DIAGNOSIS — M25551 Pain in right hip: Secondary | ICD-10-CM | POA: Diagnosis not present

## 2022-03-19 DIAGNOSIS — R2689 Other abnormalities of gait and mobility: Secondary | ICD-10-CM | POA: Diagnosis not present

## 2022-03-21 DIAGNOSIS — M25551 Pain in right hip: Secondary | ICD-10-CM | POA: Diagnosis not present

## 2022-03-21 DIAGNOSIS — R2689 Other abnormalities of gait and mobility: Secondary | ICD-10-CM | POA: Diagnosis not present

## 2022-03-22 DIAGNOSIS — Z96641 Presence of right artificial hip joint: Secondary | ICD-10-CM | POA: Diagnosis not present

## 2022-03-22 DIAGNOSIS — Z471 Aftercare following joint replacement surgery: Secondary | ICD-10-CM | POA: Diagnosis not present

## 2022-03-26 DIAGNOSIS — R2689 Other abnormalities of gait and mobility: Secondary | ICD-10-CM | POA: Diagnosis not present

## 2022-03-26 DIAGNOSIS — M25551 Pain in right hip: Secondary | ICD-10-CM | POA: Diagnosis not present

## 2022-03-28 DIAGNOSIS — R2689 Other abnormalities of gait and mobility: Secondary | ICD-10-CM | POA: Diagnosis not present

## 2022-03-28 DIAGNOSIS — M25551 Pain in right hip: Secondary | ICD-10-CM | POA: Diagnosis not present

## 2022-04-01 DIAGNOSIS — M25551 Pain in right hip: Secondary | ICD-10-CM | POA: Diagnosis not present

## 2022-04-01 DIAGNOSIS — R2689 Other abnormalities of gait and mobility: Secondary | ICD-10-CM | POA: Diagnosis not present

## 2022-04-04 DIAGNOSIS — M25551 Pain in right hip: Secondary | ICD-10-CM | POA: Diagnosis not present

## 2022-04-04 DIAGNOSIS — R2689 Other abnormalities of gait and mobility: Secondary | ICD-10-CM | POA: Diagnosis not present

## 2022-04-09 DIAGNOSIS — R2689 Other abnormalities of gait and mobility: Secondary | ICD-10-CM | POA: Diagnosis not present

## 2022-04-09 DIAGNOSIS — M25551 Pain in right hip: Secondary | ICD-10-CM | POA: Diagnosis not present

## 2022-04-11 DIAGNOSIS — M25551 Pain in right hip: Secondary | ICD-10-CM | POA: Diagnosis not present

## 2022-04-11 DIAGNOSIS — R2689 Other abnormalities of gait and mobility: Secondary | ICD-10-CM | POA: Diagnosis not present

## 2022-04-15 DIAGNOSIS — Z23 Encounter for immunization: Secondary | ICD-10-CM | POA: Diagnosis not present

## 2022-04-16 DIAGNOSIS — M25551 Pain in right hip: Secondary | ICD-10-CM | POA: Diagnosis not present

## 2022-04-16 DIAGNOSIS — R2689 Other abnormalities of gait and mobility: Secondary | ICD-10-CM | POA: Diagnosis not present

## 2022-04-18 DIAGNOSIS — M25551 Pain in right hip: Secondary | ICD-10-CM | POA: Diagnosis not present

## 2022-04-18 DIAGNOSIS — R2689 Other abnormalities of gait and mobility: Secondary | ICD-10-CM | POA: Diagnosis not present

## 2022-04-19 DIAGNOSIS — Z23 Encounter for immunization: Secondary | ICD-10-CM | POA: Diagnosis not present

## 2022-05-16 DIAGNOSIS — N3941 Urge incontinence: Secondary | ICD-10-CM | POA: Diagnosis not present

## 2022-05-16 DIAGNOSIS — K219 Gastro-esophageal reflux disease without esophagitis: Secondary | ICD-10-CM | POA: Diagnosis not present

## 2022-05-16 DIAGNOSIS — N5201 Erectile dysfunction due to arterial insufficiency: Secondary | ICD-10-CM | POA: Diagnosis not present

## 2022-05-16 DIAGNOSIS — M25522 Pain in left elbow: Secondary | ICD-10-CM | POA: Diagnosis not present

## 2022-05-16 DIAGNOSIS — C61 Malignant neoplasm of prostate: Secondary | ICD-10-CM | POA: Diagnosis not present

## 2022-05-16 DIAGNOSIS — G459 Transient cerebral ischemic attack, unspecified: Secondary | ICD-10-CM | POA: Diagnosis not present

## 2022-05-16 DIAGNOSIS — Z0000 Encounter for general adult medical examination without abnormal findings: Secondary | ICD-10-CM | POA: Diagnosis not present

## 2022-05-16 DIAGNOSIS — I10 Essential (primary) hypertension: Secondary | ICD-10-CM | POA: Diagnosis not present

## 2022-05-16 DIAGNOSIS — D751 Secondary polycythemia: Secondary | ICD-10-CM | POA: Diagnosis not present

## 2022-05-16 DIAGNOSIS — E7800 Pure hypercholesterolemia, unspecified: Secondary | ICD-10-CM | POA: Diagnosis not present

## 2022-05-16 DIAGNOSIS — R7303 Prediabetes: Secondary | ICD-10-CM | POA: Diagnosis not present

## 2022-05-16 DIAGNOSIS — M1611 Unilateral primary osteoarthritis, right hip: Secondary | ICD-10-CM | POA: Diagnosis not present

## 2022-06-07 DIAGNOSIS — M1611 Unilateral primary osteoarthritis, right hip: Secondary | ICD-10-CM | POA: Diagnosis not present

## 2022-06-20 ENCOUNTER — Inpatient Hospital Stay: Payer: Medicare Other | Attending: Hematology and Oncology

## 2022-06-20 ENCOUNTER — Inpatient Hospital Stay: Payer: Medicare Other

## 2022-06-20 ENCOUNTER — Other Ambulatory Visit: Payer: Self-pay | Admitting: Hematology and Oncology

## 2022-06-20 VITALS — BP 133/80 | HR 76 | Temp 98.2°F | Resp 18

## 2022-06-20 DIAGNOSIS — D751 Secondary polycythemia: Secondary | ICD-10-CM | POA: Insufficient documentation

## 2022-06-20 LAB — CBC WITH DIFFERENTIAL/PLATELET
Abs Immature Granulocytes: 0.01 10*3/uL (ref 0.00–0.07)
Basophils Absolute: 0 10*3/uL (ref 0.0–0.1)
Basophils Relative: 0 %
Eosinophils Absolute: 0.1 10*3/uL (ref 0.0–0.5)
Eosinophils Relative: 1 %
HCT: 47 % (ref 39.0–52.0)
Hemoglobin: 14.9 g/dL (ref 13.0–17.0)
Immature Granulocytes: 0 %
Lymphocytes Relative: 44 %
Lymphs Abs: 1.9 10*3/uL (ref 0.7–4.0)
MCH: 23.7 pg — ABNORMAL LOW (ref 26.0–34.0)
MCHC: 31.7 g/dL (ref 30.0–36.0)
MCV: 74.7 fL — ABNORMAL LOW (ref 80.0–100.0)
Monocytes Absolute: 0.5 10*3/uL (ref 0.1–1.0)
Monocytes Relative: 12 %
Neutro Abs: 1.9 10*3/uL (ref 1.7–7.7)
Neutrophils Relative %: 43 %
Platelets: 187 10*3/uL (ref 150–400)
RBC: 6.29 MIL/uL — ABNORMAL HIGH (ref 4.22–5.81)
RDW: 18.6 % — ABNORMAL HIGH (ref 11.5–15.5)
WBC: 4.5 10*3/uL (ref 4.0–10.5)
nRBC: 0 % (ref 0.0–0.2)

## 2022-06-20 NOTE — Progress Notes (Signed)
Cory [name] presents today for phlebotomy per MD orders. Phlebotomy procedure started at 0830 and ended at [phone]. 16G phlebotomy kit used in the LAC.  504 grams removed. Patient observed for 5 minutes after procedure without any incident. Declined full 30 minute observation period.  Patient tolerated procedure well. IV needle removed intact. VSS at time of discharge.

## 2022-06-20 NOTE — Patient Instructions (Signed)

## 2022-09-13 DIAGNOSIS — M545 Low back pain, unspecified: Secondary | ICD-10-CM | POA: Diagnosis not present

## 2022-09-25 DIAGNOSIS — M25551 Pain in right hip: Secondary | ICD-10-CM | POA: Diagnosis not present

## 2022-09-25 DIAGNOSIS — M25651 Stiffness of right hip, not elsewhere classified: Secondary | ICD-10-CM | POA: Diagnosis not present

## 2022-09-25 DIAGNOSIS — M545 Low back pain, unspecified: Secondary | ICD-10-CM | POA: Diagnosis not present

## 2022-10-01 DIAGNOSIS — M25551 Pain in right hip: Secondary | ICD-10-CM | POA: Diagnosis not present

## 2022-10-01 DIAGNOSIS — M545 Low back pain, unspecified: Secondary | ICD-10-CM | POA: Diagnosis not present

## 2022-10-01 DIAGNOSIS — M25651 Stiffness of right hip, not elsewhere classified: Secondary | ICD-10-CM | POA: Diagnosis not present

## 2022-10-03 DIAGNOSIS — M25651 Stiffness of right hip, not elsewhere classified: Secondary | ICD-10-CM | POA: Diagnosis not present

## 2022-10-03 DIAGNOSIS — M25551 Pain in right hip: Secondary | ICD-10-CM | POA: Diagnosis not present

## 2022-10-03 DIAGNOSIS — M545 Low back pain, unspecified: Secondary | ICD-10-CM | POA: Diagnosis not present

## 2022-10-08 DIAGNOSIS — M25651 Stiffness of right hip, not elsewhere classified: Secondary | ICD-10-CM | POA: Diagnosis not present

## 2022-10-08 DIAGNOSIS — M545 Low back pain, unspecified: Secondary | ICD-10-CM | POA: Diagnosis not present

## 2022-10-08 DIAGNOSIS — M25551 Pain in right hip: Secondary | ICD-10-CM | POA: Diagnosis not present

## 2022-10-10 DIAGNOSIS — M545 Low back pain, unspecified: Secondary | ICD-10-CM | POA: Diagnosis not present

## 2022-10-10 DIAGNOSIS — M25651 Stiffness of right hip, not elsewhere classified: Secondary | ICD-10-CM | POA: Diagnosis not present

## 2022-10-10 DIAGNOSIS — M25551 Pain in right hip: Secondary | ICD-10-CM | POA: Diagnosis not present

## 2022-10-15 DIAGNOSIS — M25651 Stiffness of right hip, not elsewhere classified: Secondary | ICD-10-CM | POA: Diagnosis not present

## 2022-10-15 DIAGNOSIS — M25551 Pain in right hip: Secondary | ICD-10-CM | POA: Diagnosis not present

## 2022-10-15 DIAGNOSIS — M545 Low back pain, unspecified: Secondary | ICD-10-CM | POA: Diagnosis not present

## 2022-10-17 DIAGNOSIS — M25551 Pain in right hip: Secondary | ICD-10-CM | POA: Diagnosis not present

## 2022-10-17 DIAGNOSIS — M25651 Stiffness of right hip, not elsewhere classified: Secondary | ICD-10-CM | POA: Diagnosis not present

## 2022-10-17 DIAGNOSIS — M545 Low back pain, unspecified: Secondary | ICD-10-CM | POA: Diagnosis not present

## 2022-10-24 ENCOUNTER — Inpatient Hospital Stay: Payer: Medicare Other

## 2022-10-24 ENCOUNTER — Inpatient Hospital Stay: Payer: Medicare Other | Attending: Hematology and Oncology | Admitting: Hematology and Oncology

## 2022-10-24 ENCOUNTER — Encounter: Payer: Self-pay | Admitting: Hematology and Oncology

## 2022-10-24 VITALS — BP 158/91 | HR 89 | Temp 98.2°F | Resp 18 | Ht 69.0 in | Wt 189.4 lb

## 2022-10-24 VITALS — BP 149/89 | HR 79 | Temp 97.8°F | Resp 16

## 2022-10-24 DIAGNOSIS — D751 Secondary polycythemia: Secondary | ICD-10-CM | POA: Diagnosis not present

## 2022-10-24 DIAGNOSIS — Z7982 Long term (current) use of aspirin: Secondary | ICD-10-CM | POA: Insufficient documentation

## 2022-10-24 DIAGNOSIS — Z79899 Other long term (current) drug therapy: Secondary | ICD-10-CM | POA: Diagnosis not present

## 2022-10-24 DIAGNOSIS — I10 Essential (primary) hypertension: Secondary | ICD-10-CM

## 2022-10-24 LAB — CBC WITH DIFFERENTIAL/PLATELET
Abs Immature Granulocytes: 0.01 10*3/uL (ref 0.00–0.07)
Basophils Absolute: 0 10*3/uL (ref 0.0–0.1)
Basophils Relative: 1 %
Eosinophils Absolute: 0.1 10*3/uL (ref 0.0–0.5)
Eosinophils Relative: 2 %
HCT: 49.1 % (ref 39.0–52.0)
Hemoglobin: 15.4 g/dL (ref 13.0–17.0)
Immature Granulocytes: 0 %
Lymphocytes Relative: 40 %
Lymphs Abs: 1.4 10*3/uL (ref 0.7–4.0)
MCH: 23.4 pg — ABNORMAL LOW (ref 26.0–34.0)
MCHC: 31.4 g/dL (ref 30.0–36.0)
MCV: 74.7 fL — ABNORMAL LOW (ref 80.0–100.0)
Monocytes Absolute: 0.4 10*3/uL (ref 0.1–1.0)
Monocytes Relative: 13 %
Neutro Abs: 1.6 10*3/uL — ABNORMAL LOW (ref 1.7–7.7)
Neutrophils Relative %: 44 %
Platelets: 173 10*3/uL (ref 150–400)
RBC: 6.57 MIL/uL — ABNORMAL HIGH (ref 4.22–5.81)
RDW: 19.3 % — ABNORMAL HIGH (ref 11.5–15.5)
WBC: 3.5 10*3/uL — ABNORMAL LOW (ref 4.0–10.5)
nRBC: 0 % (ref 0.0–0.2)

## 2022-10-24 NOTE — Progress Notes (Signed)
[facility] OFFICE PROGRESS NOTE  Patient Care Team: Ollen Bowl, MD as PCP - General (Internal Medicine) Artis Delay, MD as Consulting Physician (Hematology and Oncology)  ASSESSMENT & PLAN:  Polycythemia His blood count is stable with spacing out phlebotomy sessions I recommend phlebotomy every 4 months  He will get 1 unit of blood removed each time We will omit phlebotomy if his hemoglobin is less than 14 I plan to see him again in 12 months for further follow-up.  He agreed He will continue aspirin to prevent risk of thrombosis  Essential hypertension He has mild intermittent hypertension Today's blood pressure could be elevated due to whitecoat hypertension Observe closely for now  No orders of the defined types were placed in this encounter.   All questions were answered. The patient knows to call the clinic with any problems, questions or concerns. The total time spent in the appointment was 20 minutes encounter with patients including review of chart and various tests results, discussions about plan of care and coordination of care plan   Artis Delay, MD 10/24/2022 8:48 AM  INTERVAL HISTORY: Please see below for problem oriented charting. he returns for treatment follow-up on phlebotomy treatment every 4 months for polycythemia Since last time I saw him, he had hip surgery He is doing very well He has no new health issues He tolerated phlebotomy very well  REVIEW OF SYSTEMS:   Constitutional: Denies fevers, chills or abnormal weight loss Eyes: Denies blurriness of vision Ears, nose, mouth, throat, and face: Denies mucositis or sore throat Respiratory: Denies cough, dyspnea or wheezes Cardiovascular: Denies palpitation, chest discomfort or lower extremity swelling Gastrointestinal:  Denies nausea, heartburn or change in bowel habits Skin: Denies abnormal skin rashes Lymphatics: Denies new lymphadenopathy or easy bruising Neurological:Denies  numbness, tingling or new weaknesses Behavioral/Psych: Mood is stable, no new changes  All other systems were reviewed with the patient and are negative.  I have reviewed the past medical history, past surgical history, social history and family history with the patient and they are unchanged from previous note.  ALLERGIES:  has No Known Allergies.  MEDICATIONS:  Current Outpatient Medications  Medication Sig Dispense Refill   amLODipine-benazepril (LOTREL) 5-40 MG capsule Take 1 capsule by mouth daily.     Carboxymethylcellulose Sod PF 0.5 % SOLN INSTILL 1 DROP IN EACH EYE 2-4 TIMES A DAY FOR DRY EYE     aspirin EC 81 MG tablet Take 1 tablet (81 mg total) by mouth 2 (two) times daily after a meal. For 2 weeks then once a day for 2 weeks for DVTprevention. 45 tablet 0   atorvastatin (LIPITOR) 40 MG tablet Take 40 mg by mouth in the morning.     oxybutynin (DITROPAN-XL) 10 MG 24 hr tablet Take 10 mg by mouth in the morning.     No current facility-administered medications for this visit.    SUMMARY OF ONCOLOGIC HISTORY: Oncology History   No history exists.    PHYSICAL EXAMINATION: ECOG PERFORMANCE STATUS: 0 - Asymptomatic  Vitals:   10/24/22 0811  BP: (!) 158/91  Pulse: 89  Resp: 18  Temp: 98.2 F (36.8 C)  SpO2: 100%   Filed Weights   10/24/22 0811  Weight: 189 lb 6.4 oz (85.9 kg)    GENERAL:alert, no distress and comfortable NEURO: alert & oriented x 3 with fluent speech, no focal motor/sensory deficits  LABORATORY DATA:  I have reviewed the data as listed    Component Value Date/Time  NA 141 03/05/2022 0930   NA 144 03/03/2013 0944   K 3.8 03/05/2022 0930   K 3.6 03/03/2013 0944   CL 110 03/05/2022 0930   CL 106 08/04/2012 0957   CO2 25 03/05/2022 0930   CO2 26 03/03/2013 0944   GLUCOSE 106 (H) 03/05/2022 0930   GLUCOSE 132 03/03/2013 0944   GLUCOSE 108 (H) 08/04/2012 0957   BUN 20 03/05/2022 0930   BUN 19.6 03/03/2013 0944   CREATININE 1.09  03/05/2022 0930   CREATININE 1.2 03/03/2013 0944   CALCIUM 9.1 03/05/2022 0930   CALCIUM 9.3 03/03/2013 0944   PROT 6.9 08/05/2017 0906   PROT 6.9 03/03/2013 0944   ALBUMIN 4.0 08/05/2017 0906   ALBUMIN 3.9 03/03/2013 0944   AST 24 08/05/2017 0906   AST 20 03/03/2013 0944   ALT 25 08/05/2017 0906   ALT 27 03/03/2013 0944   ALKPHOS 74 08/05/2017 0906   ALKPHOS 69 03/03/2013 0944   BILITOT 1.3 (H) 08/05/2017 0906   BILITOT 1.29 (H) 03/03/2013 0944   GFRNONAA >60 03/05/2022 0930   GFRAA >60 08/05/2017 0906    No results found for: "SPEP", "UPEP"  Lab Results  Component Value Date   WBC 3.5 (L) 10/24/2022   NEUTROABS 1.6 (L) 10/24/2022   HGB 15.4 10/24/2022   HCT 49.1 10/24/2022   MCV 74.7 (L) 10/24/2022   PLT 173 10/24/2022      Chemistry      Component Value Date/Time   NA 141 03/05/2022 0930   NA 144 03/03/2013 0944   K 3.8 03/05/2022 0930   K 3.6 03/03/2013 0944   CL 110 03/05/2022 0930   CL 106 08/04/2012 0957   CO2 25 03/05/2022 0930   CO2 26 03/03/2013 0944   BUN 20 03/05/2022 0930   BUN 19.6 03/03/2013 0944   CREATININE 1.09 03/05/2022 0930   CREATININE 1.2 03/03/2013 0944      Component Value Date/Time   CALCIUM 9.1 03/05/2022 0930   CALCIUM 9.3 03/03/2013 0944   ALKPHOS 74 08/05/2017 0906   ALKPHOS 69 03/03/2013 0944   AST 24 08/05/2017 0906   AST 20 03/03/2013 0944   ALT 25 08/05/2017 0906   ALT 27 03/03/2013 0944   BILITOT 1.3 (H) 08/05/2017 0906   BILITOT 1.29 (H) 03/03/2013 7829

## 2022-10-24 NOTE — Progress Notes (Signed)
Cory [name] presents today for phlebotomy per MD orders. Phlebotomy procedure started at 828am and ended at 843am. 516 grams removed using a 16g phlebotomy set. Patient declined observation for 30 minutes after procedure. Patient tolerated procedure well. IV needle removed intact. VSS at discharge.

## 2022-10-24 NOTE — Assessment & Plan Note (Signed)
He has mild intermittent hypertension ?Today's blood pressure could be elevated due to whitecoat hypertension ?Observe closely for now ?

## 2022-10-24 NOTE — Assessment & Plan Note (Signed)
His blood count is stable with spacing out phlebotomy sessions ?I recommend phlebotomy every 4 months  ?He will get 1 unit of blood removed each time ?We will omit phlebotomy if his hemoglobin is less than 14 ?I plan to see him again in 12 months for further follow-up.  He agreed ?He will continue aspirin to prevent risk of thrombosis ?

## 2022-10-24 NOTE — Patient Instructions (Signed)

## 2022-10-25 DIAGNOSIS — M25551 Pain in right hip: Secondary | ICD-10-CM | POA: Diagnosis not present

## 2022-10-25 DIAGNOSIS — M545 Low back pain, unspecified: Secondary | ICD-10-CM | POA: Diagnosis not present

## 2022-10-25 DIAGNOSIS — M25651 Stiffness of right hip, not elsewhere classified: Secondary | ICD-10-CM | POA: Diagnosis not present

## 2022-11-14 DIAGNOSIS — E7800 Pure hypercholesterolemia, unspecified: Secondary | ICD-10-CM | POA: Diagnosis not present

## 2022-11-14 DIAGNOSIS — I10 Essential (primary) hypertension: Secondary | ICD-10-CM | POA: Diagnosis not present

## 2022-11-14 DIAGNOSIS — R7303 Prediabetes: Secondary | ICD-10-CM | POA: Diagnosis not present

## 2022-11-14 DIAGNOSIS — D751 Secondary polycythemia: Secondary | ICD-10-CM | POA: Diagnosis not present

## 2022-11-14 DIAGNOSIS — G459 Transient cerebral ischemic attack, unspecified: Secondary | ICD-10-CM | POA: Diagnosis not present

## 2022-11-14 DIAGNOSIS — G4733 Obstructive sleep apnea (adult) (pediatric): Secondary | ICD-10-CM | POA: Diagnosis not present

## 2022-11-14 DIAGNOSIS — K219 Gastro-esophageal reflux disease without esophagitis: Secondary | ICD-10-CM | POA: Diagnosis not present

## 2022-11-14 DIAGNOSIS — N3941 Urge incontinence: Secondary | ICD-10-CM | POA: Diagnosis not present

## 2022-12-02 ENCOUNTER — Telehealth: Payer: Self-pay | Admitting: Hematology and Oncology

## 2022-12-02 ENCOUNTER — Telehealth: Payer: Self-pay | Admitting: *Deleted

## 2022-12-02 NOTE — Telephone Encounter (Signed)
Registrar Verlon Au asked this nurse if a copy of records request for [facility] is here,  No Forms received for this patient.  EMR patient files tab shows a "Patient Request for Access" received 11/20/2022 for paper copy of records from 10/29/2021 to present.  No method of receipt selected.  No releases by H.I.M noted since May 2023.  Provided (SW) H.I.M. phone number [phone] to [facility] for patient to connect with this office that [facility] medical record request.

## 2022-12-03 ENCOUNTER — Other Ambulatory Visit: Payer: Self-pay

## 2022-12-03 ENCOUNTER — Inpatient Hospital Stay: Payer: Medicare Other | Attending: Hematology and Oncology | Admitting: Hematology and Oncology

## 2022-12-03 ENCOUNTER — Encounter: Payer: Self-pay | Admitting: Hematology and Oncology

## 2022-12-03 VITALS — BP 150/80 | HR 72 | Temp 97.9°F | Resp 19 | Ht 69.0 in | Wt 188.2 lb

## 2022-12-03 DIAGNOSIS — G4733 Obstructive sleep apnea (adult) (pediatric): Secondary | ICD-10-CM | POA: Diagnosis not present

## 2022-12-03 DIAGNOSIS — D751 Secondary polycythemia: Secondary | ICD-10-CM | POA: Insufficient documentation

## 2022-12-03 NOTE — Assessment & Plan Note (Signed)
The patient has completed recent sleep study and confirmed diagnosis of obstructive sleep apnea Overall, the cause of his polycythemia is secondary to untreated obstructive sleep apnea The patient wants me to type a letter of this diagnosis to be given to his [employer] for benefit claims I gave him the letter today

## 2022-12-03 NOTE — Progress Notes (Signed)
[facility] OFFICE PROGRESS NOTE  Patient Care Team: Ollen Bowl, MD as PCP - General (Internal Medicine) Artis Delay, MD as Consulting Physician (Hematology and Oncology)  ASSESSMENT & PLAN:  Polycythemia The patient has completed recent sleep study and confirmed diagnosis of obstructive sleep apnea Overall, the cause of his polycythemia is secondary to untreated obstructive sleep apnea The patient wants me to type a letter of this diagnosis to be given to his [employer] for benefit claims I gave him the letter today  No orders of the defined types were placed in this encounter.   All questions were answered. The patient knows to call the clinic with any problems, questions or concerns. The total time spent in the appointment was 20 minutes encounter with patients including review of chart and various tests results, discussions about plan of care and coordination of care plan   Artis Delay, MD 12/03/2022 12:44 PM  INTERVAL HISTORY: Please see below for problem oriented charting. he returns for treatment follow-up The patient scheduled this appointment to get a letter from me today for [employer] benefit claims  REVIEW OF SYSTEMS:   Constitutional: Denies fevers, chills or abnormal weight loss Eyes: Denies blurriness of vision Ears, nose, mouth, throat, and face: Denies mucositis or sore throat Respiratory: Denies cough, dyspnea or wheezes Cardiovascular: Denies palpitation, chest discomfort or lower extremity swelling Gastrointestinal:  Denies nausea, heartburn or change in bowel habits Skin: Denies abnormal skin rashes Lymphatics: Denies new lymphadenopathy or easy bruising Neurological:Denies numbness, tingling or new weaknesses Behavioral/Psych: Mood is stable, no new changes  All other systems were reviewed with the patient and are negative.  I have reviewed the past medical history, past surgical history, social history and family history with the patient and they are  unchanged from previous note.  ALLERGIES:  has No Known Allergies.  MEDICATIONS:  Current Outpatient Medications  Medication Sig Dispense Refill   amLODipine-benazepril (LOTREL) 5-40 MG capsule Take 1 capsule by mouth daily.     aspirin EC 81 MG tablet Take 1 tablet (81 mg total) by mouth 2 (two) times daily after a meal. For 2 weeks then once a day for 2 weeks for DVTprevention. 45 tablet 0   atorvastatin (LIPITOR) 40 MG tablet Take 40 mg by mouth in the morning.     Carboxymethylcellulose Sod PF 0.5 % SOLN INSTILL 1 DROP IN EACH EYE 2-4 TIMES A DAY FOR DRY EYE     oxybutynin (DITROPAN-XL) 10 MG 24 hr tablet Take 10 mg by mouth in the morning.     No current facility-administered medications for this visit.    SUMMARY OF ONCOLOGIC HISTORY:  This is a pleasant gentleman who was found to have abnormal CBC with polycythemia In 2014 he had a bone marrow aspirate and biopsy that came back nondiagnostic for myeloproliferative disorder. JAK 2 mutation testing was negative. The patient had intermittent phlebotomy sessions and remain on aspirin  PHYSICAL EXAMINATION: ECOG PERFORMANCE STATUS: 0 - Asymptomatic  Vitals:   12/03/22 1213  BP: (!) 150/80  Pulse: 72  Resp: 19  Temp: 97.9 F (36.6 C)  SpO2: 100%   Filed Weights   12/03/22 1213  Weight: 188 lb 4 oz (85.4 kg)    GENERAL:alert, no distress and comfortable  NEURO: alert & oriented x 3 with fluent speech, no focal motor/sensory deficits  LABORATORY DATA:  I have reviewed the data as listed    Component Value Date/Time   NA 141 03/05/2022 0930   NA  144 03/03/2013 0944   K 3.8 03/05/2022 0930   K 3.6 03/03/2013 0944   CL 110 03/05/2022 0930   CL 106 08/04/2012 0957   CO2 25 03/05/2022 0930   CO2 26 03/03/2013 0944   GLUCOSE 106 (H) 03/05/2022 0930   GLUCOSE 132 03/03/2013 0944   GLUCOSE 108 (H) 08/04/2012 0957   BUN 20 03/05/2022 0930   BUN 19.6 03/03/2013 0944   CREATININE 1.09 03/05/2022 0930   CREATININE 1.2  03/03/2013 0944   CALCIUM 9.1 03/05/2022 0930   CALCIUM 9.3 03/03/2013 0944   PROT 6.9 08/05/2017 0906   PROT 6.9 03/03/2013 0944   ALBUMIN 4.0 08/05/2017 0906   ALBUMIN 3.9 03/03/2013 0944   AST 24 08/05/2017 0906   AST 20 03/03/2013 0944   ALT 25 08/05/2017 0906   ALT 27 03/03/2013 0944   ALKPHOS 74 08/05/2017 0906   ALKPHOS 69 03/03/2013 0944   BILITOT 1.3 (H) 08/05/2017 0906   BILITOT 1.29 (H) 03/03/2013 0944   GFRNONAA >60 03/05/2022 0930   GFRAA >60 08/05/2017 0906    No results found for: "SPEP", "UPEP"  Lab Results  Component Value Date   WBC 3.5 (L) 10/24/2022   NEUTROABS 1.6 (L) 10/24/2022   HGB 15.4 10/24/2022   HCT 49.1 10/24/2022   MCV 74.7 (L) 10/24/2022   PLT 173 10/24/2022      Chemistry      Component Value Date/Time   NA 141 03/05/2022 0930   NA 144 03/03/2013 0944   K 3.8 03/05/2022 0930   K 3.6 03/03/2013 0944   CL 110 03/05/2022 0930   CL 106 08/04/2012 0957   CO2 25 03/05/2022 0930   CO2 26 03/03/2013 0944   BUN 20 03/05/2022 0930   BUN 19.6 03/03/2013 0944   CREATININE 1.09 03/05/2022 0930   CREATININE 1.2 03/03/2013 0944      Component Value Date/Time   CALCIUM 9.1 03/05/2022 0930   CALCIUM 9.3 03/03/2013 0944   ALKPHOS 74 08/05/2017 0906   ALKPHOS 69 03/03/2013 0944   AST 24 08/05/2017 0906   AST 20 03/03/2013 0944   ALT 25 08/05/2017 0906   ALT 27 03/03/2013 0944   BILITOT 1.3 (H) 08/05/2017 0906   BILITOT 1.29 (H) 03/03/2013 8469

## 2023-01-10 NOTE — Progress Notes (Signed)
   I, Stevenson Clinch, CMA acting as a scribe for Clementeen Graham, MD.  Cory [name] is a 87 y.o. male who presents to [facility] at [facility] today for shoulder pain. Pt was previously seen by Dr. Katrinka Blazing in 2022-23 for R hip and low back pain.  Today, pt c/o L shoulder pain x 2-3 weeks. Pt locates pain to lateral aspect of the shoulder. Notes sitting at rest and the shoulder begins to jump. Feels that the left thumb will lock up at times. Denies sharp shooting pain. Sx will flare while driving or sitting watching TV.   Neck pain: no Radiates: no Aggravates: unknown Treatments tried: none  Dx imaging: 05/09/21 R shoulder XR  03/23/21 C-spine CT  Pertinent review of systems: No fevers or chills  Relevant historical information: Bilateral hip replacement.   Exam:  BP (!) 164/90   Pulse 77   Ht 5\' 9"  (1.753 m)   Wt 189 lb 3.2 oz (85.8 kg)   SpO2 98%   BMI 27.94 kg/m  General: Well Developed, well nourished, and in no acute distress.   MSK: Left shoulder normal-appearing normal motion.  Nontender.  Intact strength.     Assessment and Plan: 87 y.o. male with left shoulder twitching.  Symptoms seem to have resolved or almost completely resolved on their own.  He is asymptomatic as far as I can tell today.  Plan for some watchful waiting.  Will go ahead and check some basic metabolic panel and CK looking for electrolyte abnormalities and myopathy.  He does take ACE inhibitor and a statin which could cause problems for both of those.  Been a while since they were checked.  Check back as needed.   PDMP not reviewed this encounter. Orders Placed This Encounter  Procedures   Comprehensive metabolic panel    Standing Status:   Future    Number of Occurrences:   1    Standing Expiration Date:   01/13/2024   CK (Creatine Kinase)    Standing Status:   Future    Number of Occurrences:   1    Standing Expiration Date:   01/13/2024   No orders of the defined types were  placed in this encounter.    Discussed warning signs or symptoms. Please see discharge instructions. Patient expresses understanding.   The above documentation has been reviewed and is accurate and complete Clementeen Graham, M.D.

## 2023-01-13 ENCOUNTER — Ambulatory Visit: Payer: Medicare Other | Admitting: Family Medicine

## 2023-01-13 ENCOUNTER — Encounter: Payer: Self-pay | Admitting: Family Medicine

## 2023-01-13 VITALS — BP 164/90 | HR 77 | Ht 69.0 in | Wt 189.2 lb

## 2023-01-13 DIAGNOSIS — R253 Fasciculation: Secondary | ICD-10-CM

## 2023-01-13 LAB — COMPREHENSIVE METABOLIC PANEL
ALT: 20 U/L (ref 0–53)
AST: 18 U/L (ref 0–37)
Albumin: 4.2 g/dL (ref 3.5–5.2)
Alkaline Phosphatase: 81 U/L (ref 39–117)
BUN: 16 mg/dL (ref 6–23)
CO2: 26 mEq/L (ref 19–32)
Calcium: 9.4 mg/dL (ref 8.4–10.5)
Chloride: 109 mEq/L (ref 96–112)
Creatinine, Ser: 1.09 mg/dL (ref 0.40–1.50)
GFR: 62.3 mL/min (ref >60.00)
Glucose, Bld: 103 mg/dL — ABNORMAL HIGH (ref 70–99)
Potassium: 3.7 mEq/L (ref 3.5–5.1)
Sodium: 140 mEq/L (ref 135–145)
Total Bilirubin: 1.8 mg/dL — ABNORMAL HIGH (ref 0.2–1.2)
Total Protein: 7.1 g/dL (ref 6.0–8.3)

## 2023-01-13 LAB — CK: Total CK: 142 U/L (ref 7–232)

## 2023-01-13 NOTE — Patient Instructions (Signed)
Thank you for coming in today.   Please get labs today before you leave   Keep an eye on the twitching.

## 2023-01-14 NOTE — Progress Notes (Signed)
Labs look okay.  No significant electrolyte abnormality.

## 2023-02-25 ENCOUNTER — Inpatient Hospital Stay: Payer: Medicare Other | Attending: Hematology and Oncology

## 2023-02-25 ENCOUNTER — Inpatient Hospital Stay: Payer: Medicare Other

## 2023-02-25 DIAGNOSIS — D751 Secondary polycythemia: Secondary | ICD-10-CM | POA: Diagnosis not present

## 2023-02-25 LAB — CBC WITH DIFFERENTIAL/PLATELET
Abs Immature Granulocytes: 0.01 10*3/uL (ref 0.00–0.07)
Basophils Absolute: 0 10*3/uL (ref 0.0–0.1)
Basophils Relative: 1 %
Eosinophils Absolute: 0.1 10*3/uL (ref 0.0–0.5)
Eosinophils Relative: 2 %
HCT: 50 % (ref 39.0–52.0)
Hemoglobin: 15.6 g/dL (ref 13.0–17.0)
Immature Granulocytes: 0 %
Lymphocytes Relative: 34 %
Lymphs Abs: 1.4 10*3/uL (ref 0.7–4.0)
MCH: 23.7 pg — ABNORMAL LOW (ref 26.0–34.0)
MCHC: 31.2 g/dL (ref 30.0–36.0)
MCV: 75.9 fL — ABNORMAL LOW (ref 80.0–100.0)
Monocytes Absolute: 0.6 10*3/uL (ref 0.1–1.0)
Monocytes Relative: 14 %
Neutro Abs: 2 10*3/uL (ref 1.7–7.7)
Neutrophils Relative %: 49 %
Platelets: 159 10*3/uL (ref 150–400)
RBC: 6.59 MIL/uL — ABNORMAL HIGH (ref 4.22–5.81)
RDW: 19.7 % — ABNORMAL HIGH (ref 11.5–15.5)
WBC: 4.1 10*3/uL (ref 4.0–10.5)
nRBC: 0 % (ref 0.0–0.2)

## 2023-02-25 NOTE — Progress Notes (Signed)
Cory [name] presents today for phlebotomy per MD orders. Phlebotomy procedure started at 0846 and ended at 0901. 514 grams removed with 16g IV kit. IV was positional and struggled to get adequate blood flow. Patient declined to be observed for 30 minutes after procedure. Patient tolerated procedure well. IV needle removed intact. VSS at d/c.

## 2023-02-26 DIAGNOSIS — R351 Nocturia: Secondary | ICD-10-CM | POA: Diagnosis not present

## 2023-02-26 DIAGNOSIS — N3941 Urge incontinence: Secondary | ICD-10-CM | POA: Diagnosis not present

## 2023-03-10 DIAGNOSIS — Z23 Encounter for immunization: Secondary | ICD-10-CM | POA: Diagnosis not present
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
## 2023-05-20 DIAGNOSIS — N3941 Urge incontinence: Secondary | ICD-10-CM | POA: Diagnosis not present

## 2023-05-20 DIAGNOSIS — I10 Essential (primary) hypertension: Secondary | ICD-10-CM | POA: Diagnosis not present

## 2023-05-20 DIAGNOSIS — Z0000 Encounter for general adult medical examination without abnormal findings: Secondary | ICD-10-CM | POA: Diagnosis not present

## 2023-05-20 DIAGNOSIS — L918 Other hypertrophic disorders of the skin: Secondary | ICD-10-CM | POA: Diagnosis not present

## 2023-05-20 DIAGNOSIS — M1611 Unilateral primary osteoarthritis, right hip: Secondary | ICD-10-CM | POA: Diagnosis not present

## 2023-05-20 DIAGNOSIS — G459 Transient cerebral ischemic attack, unspecified: Secondary | ICD-10-CM | POA: Diagnosis not present

## 2023-05-20 DIAGNOSIS — N5201 Erectile dysfunction due to arterial insufficiency: Secondary | ICD-10-CM | POA: Diagnosis not present

## 2023-05-20 DIAGNOSIS — D751 Secondary polycythemia: Secondary | ICD-10-CM | POA: Diagnosis not present

## 2023-05-20 DIAGNOSIS — C61 Malignant neoplasm of prostate: Secondary | ICD-10-CM | POA: Diagnosis not present

## 2023-05-20 DIAGNOSIS — E7800 Pure hypercholesterolemia, unspecified: Secondary | ICD-10-CM | POA: Diagnosis not present

## 2023-05-20 DIAGNOSIS — K219 Gastro-esophageal reflux disease without esophagitis: Secondary | ICD-10-CM | POA: Diagnosis not present

## 2023-05-20 DIAGNOSIS — R7303 Prediabetes: Secondary | ICD-10-CM | POA: Diagnosis not present

## 2023-06-16 ENCOUNTER — Inpatient Hospital Stay: Payer: Medicare Other | Attending: Hematology and Oncology

## 2023-06-16 ENCOUNTER — Inpatient Hospital Stay: Payer: Medicare Other

## 2023-06-16 VITALS — BP 138/87 | HR 76 | Resp 16

## 2023-06-16 DIAGNOSIS — D751 Secondary polycythemia: Secondary | ICD-10-CM | POA: Insufficient documentation

## 2023-06-16 DIAGNOSIS — Z79899 Other long term (current) drug therapy: Secondary | ICD-10-CM | POA: Insufficient documentation

## 2023-06-16 LAB — CBC WITH DIFFERENTIAL/PLATELET
Abs Immature Granulocytes: 0.02 10*3/uL (ref 0.00–0.07)
Basophils Absolute: 0 10*3/uL (ref 0.0–0.1)
Basophils Relative: 0 %
Eosinophils Absolute: 0.1 10*3/uL (ref 0.0–0.5)
Eosinophils Relative: 1 %
HCT: 50.1 % (ref 39.0–52.0)
Hemoglobin: 16.1 g/dL (ref 13.0–17.0)
Immature Granulocytes: 0 %
Lymphocytes Relative: 41 %
Lymphs Abs: 2.1 10*3/uL (ref 0.7–4.0)
MCH: 24.7 pg — ABNORMAL LOW (ref 26.0–34.0)
MCHC: 32.1 g/dL (ref 30.0–36.0)
MCV: 77 fL — ABNORMAL LOW (ref 80.0–100.0)
Monocytes Absolute: 0.6 10*3/uL (ref 0.1–1.0)
Monocytes Relative: 12 %
Neutro Abs: 2.3 10*3/uL (ref 1.7–7.7)
Neutrophils Relative %: 46 %
Platelets: 169 10*3/uL (ref 150–400)
RBC: 6.51 MIL/uL — ABNORMAL HIGH (ref 4.22–5.81)
RDW: 18.2 % — ABNORMAL HIGH (ref 11.5–15.5)
WBC: 5.1 10*3/uL (ref 4.0–10.5)
nRBC: 0 % (ref 0.0–0.2)

## 2023-06-16 NOTE — Patient Instructions (Signed)

## 2023-06-16 NOTE — Progress Notes (Signed)
Cory [name] presents today for phlebotomy per MD orders. Hgb 16.1. Phlebotomy procedure started at 1435 and ended at 1440 before clotting off. removed via LAC via 16G. Phlebotomy procedure restarted at 1442 and ended at 1452. 400 grams removed via RAC via 16G  Patient declined to stay for 30 minutes after procedure. Patient tolerated procedure well. IV needle removed intact.

## 2023-06-23 ENCOUNTER — Inpatient Hospital Stay: Payer: Medicare Other

## 2023-07-03 DIAGNOSIS — L918 Other hypertrophic disorders of the skin: Secondary | ICD-10-CM | POA: Diagnosis not present

## 2023-07-03 DIAGNOSIS — L821 Other seborrheic keratosis: Secondary | ICD-10-CM | POA: Diagnosis not present

## 2023-07-03 DIAGNOSIS — R0982 Postnasal drip: Secondary | ICD-10-CM | POA: Diagnosis not present

## 2023-08-21 ENCOUNTER — Ambulatory Visit (INDEPENDENT_AMBULATORY_CARE_PROVIDER_SITE_OTHER): Payer: Medicare Other

## 2023-08-21 ENCOUNTER — Ambulatory Visit: Payer: Medicare Other | Admitting: Family Medicine

## 2023-08-21 VITALS — BP 178/104 | HR 84 | Ht 69.0 in | Wt 194.0 lb

## 2023-08-21 DIAGNOSIS — G8929 Other chronic pain: Secondary | ICD-10-CM | POA: Diagnosis not present

## 2023-08-21 DIAGNOSIS — M5441 Lumbago with sciatica, right side: Secondary | ICD-10-CM | POA: Diagnosis not present

## 2023-08-21 DIAGNOSIS — M5442 Lumbago with sciatica, left side: Secondary | ICD-10-CM

## 2023-08-21 DIAGNOSIS — M47816 Spondylosis without myelopathy or radiculopathy, lumbar region: Secondary | ICD-10-CM | POA: Diagnosis not present

## 2023-08-21 DIAGNOSIS — M5136 Other intervertebral disc degeneration, lumbar region with discogenic back pain only: Secondary | ICD-10-CM | POA: Diagnosis not present

## 2023-08-21 MED ORDER — PREDNISONE 50 MG PO TABS: ORAL_TABLET | ORAL | 0 refills | Status: DC

## 2023-08-21 NOTE — Patient Instructions (Signed)
Thank you for coming in today.   Try using a heating pad  I've sent a prescription for Prednisone to your pharmacy.  Please get an Xray today before you leave  Check back as needed

## 2023-08-21 NOTE — Progress Notes (Signed)
   [name], PhD, LAT, ATC acting as a scribe for Clementeen Graham, MD.  Cory [name] is a 88 y.o. male who presents to [facility] at [facility] today for LBP. Pt was last seen for his back by Dr. Katrinka Blazing in 2022.   Today, pt reports LBP returning yesterday morning. Pt states that he just woke up in the morning w/ the pain. Pt locates pain to across both sides of his low back.   Radiating pain: yes- when transitioning to stand, into bilat buttock and posterior thigh LE numbness/tingling: no LE weakness: no Aggravates: transitioning to stand, trunk flexion Treatments tried: Tylenol  Dx imaging: 05/20/21 L-spine MRI  03/28/21 L-spine XR  Pertinent review of systems: No fevers or chills  Relevant historical information: Hypertension Bilateral hip replacement.  DDD and spinal stenosis lumbar spine.  Exam:  BP (!) 178/104   Pulse 84   Ht 5\' 9"  (1.753 m)   Wt 194 lb (88 kg)   SpO2 96%   BMI 28.65 kg/m  General: Well Developed, well nourished, and in no acute distress.   MSK: L-spine: Normal appearing Nontender palpation spinal midline.  Mild tender palpation bilateral paraspinal musculature. Decreased lumbar motion. Lower extremity strength is intact. Reflexes are intact.    Lab and Radiology Results  X-ray images lumbar spine obtained today personally and independently interpreted. Multilevel DDD and facet DJD.  No acute fractures are visible. Await formal radiology review     Assessment and Plan: 88 y.o. male with acute exacerbation of chronic low back pain.  Pain is located bilateral lower back with pain referring or radiating to the posterior thighs.  He does not have pain radiating below the level of the knee.  Pain today due to muscle spasm and dysfunction.  There could be an exacerbation of his spinal stenosis/sciatica but this is less likely.  After discussion plan for heating pad and a course of prednisone.  We talked about muscle relaxers but  decided against it as he has found them to be not helpful previously.  Will also refer to PT.  Check back as needed.   PDMP not reviewed this encounter. Orders Placed This Encounter  Procedures   DG Lumbar Spine 2-3 Views    Standing Status:   Future    Number of Occurrences:   1    Expiration Date:   09/18/2023    Reason for Exam (SYMPTOM  OR DIAGNOSIS REQUIRED):   low back pain    Preferred imaging location?:   [facility]   Ambulatory referral to Physical Therapy    Referral Priority:   Routine    Referral Type:   Physical Medicine    Referral Reason:   Specialty Services Required    Requested Specialty:   Physical Therapy    Number of Visits Requested:   1   Meds ordered this encounter  Medications   predniSONE (DELTASONE) 50 MG tablet    Sig: Take 1 pill daily for 5 days    Dispense:  5 tablet    Refill:  0     Discussed warning signs or symptoms. Please see discharge instructions. Patient expresses understanding.   The above documentation has been reviewed and is accurate and complete Clementeen Graham, M.D.

## 2023-09-09 ENCOUNTER — Encounter: Payer: Self-pay | Admitting: Family Medicine

## 2023-09-09 NOTE — Progress Notes (Signed)
 Low back x-ray shows arthritis changes at the base of the spine.

## 2023-09-24 ENCOUNTER — Ambulatory Visit: Payer: Medicare Other | Admitting: Family Medicine

## 2023-10-21 ENCOUNTER — Inpatient Hospital Stay: Payer: Medicare Other

## 2023-10-21 ENCOUNTER — Inpatient Hospital Stay: Payer: Medicare Other | Attending: Hematology and Oncology | Admitting: Hematology and Oncology

## 2023-10-21 ENCOUNTER — Encounter: Payer: Self-pay | Admitting: Hematology and Oncology

## 2023-10-21 VITALS — BP 158/79 | HR 84 | Temp 98.3°F | Resp 18 | Ht 69.0 in | Wt 191.6 lb

## 2023-10-21 VITALS — BP 161/81 | HR 76 | Temp 97.8°F | Resp 16

## 2023-10-21 DIAGNOSIS — D751 Secondary polycythemia: Secondary | ICD-10-CM

## 2023-10-21 LAB — CBC WITH DIFFERENTIAL/PLATELET
Abs Immature Granulocytes: 0.03 10*3/uL (ref 0.00–0.07)
Basophils Absolute: 0 10*3/uL (ref 0.0–0.1)
Basophils Relative: 1 %
Eosinophils Absolute: 0.1 10*3/uL (ref 0.0–0.5)
Eosinophils Relative: 2 %
HCT: 50.1 % (ref 39.0–52.0)
Hemoglobin: 16.2 g/dL (ref 13.0–17.0)
Immature Granulocytes: 1 %
Lymphocytes Relative: 36 %
Lymphs Abs: 1.6 10*3/uL (ref 0.7–4.0)
MCH: 24.5 pg — ABNORMAL LOW (ref 26.0–34.0)
MCHC: 32.3 g/dL (ref 30.0–36.0)
MCV: 75.8 fL — ABNORMAL LOW (ref 80.0–100.0)
Monocytes Absolute: 0.5 10*3/uL (ref 0.1–1.0)
Monocytes Relative: 12 %
Neutro Abs: 2.2 10*3/uL (ref 1.7–7.7)
Neutrophils Relative %: 48 %
Platelets: 149 10*3/uL — ABNORMAL LOW (ref 150–400)
RBC: 6.61 MIL/uL — ABNORMAL HIGH (ref 4.22–5.81)
RDW: 18.4 % — ABNORMAL HIGH (ref 11.5–15.5)
WBC: 4.4 10*3/uL (ref 4.0–10.5)
nRBC: 0 % (ref 0.0–0.2)

## 2023-10-21 NOTE — Assessment & Plan Note (Addendum)
 The patient has completed recent sleep study and confirmed diagnosis of obstructive sleep apnea Overall, the cause of his polycythemia is secondary to untreated obstructive sleep apnea He will continue his CPAP machine to treat his sleep apnea From the secondary polycythemia standpoint, he will continue to return here every 4 months to get a unit of blood removed whenever his hemoglobin is greater than 15

## 2023-10-21 NOTE — Progress Notes (Signed)
 Cory [name] presents today for phlebotomy per MD orders. Phlebotomy procedure started at 9:10 and ended at 0917 508 grams removed. Phlebotomy kit used. Patient observed for 30 minutes after procedure without any incident. Patient tolerated procedure well. IV needle removed intact. Food and fluids offered and fluid taken. Pt. Declined to stay for full 30 minute post observation. Denies any issues, vital signs stable, left via ambulation, and no respiratory distress noted.

## 2023-10-21 NOTE — Patient Instructions (Signed)

## 2023-10-21 NOTE — Progress Notes (Signed)
 [facility] OFFICE PROGRESS NOTE  Patient Care Team: Elester Grim, MD as PCP - General (Internal Medicine) Almeda Jacobs, MD as Consulting Physician (Hematology and Oncology)  Assessment & Plan Polycythemia The patient has completed recent sleep study and confirmed diagnosis of obstructive sleep apnea Overall, the cause of his polycythemia is secondary to untreated obstructive sleep apnea He will continue his CPAP machine to treat his sleep apnea From the secondary polycythemia standpoint, he will continue to return here every 4 months to get a unit of blood removed whenever his hemoglobin is greater than 15  No orders of the defined types were placed in this encounter.    Almeda Jacobs, MD  INTERVAL HISTORY: he returns for treatment follow-up He is doing well on phlebotomy He is also using his CPAP machine for approximately 6 hours daily No complications from his treatment  PHYSICAL EXAMINATION: ECOG PERFORMANCE STATUS: 0 - Asymptomatic  Vitals:   10/21/23 0830  BP: (!) 158/79  Pulse: 84  Resp: 18  Temp: 98.3 F (36.8 C)  SpO2: 100%   Filed Weights   10/21/23 0830  Weight: 191 lb 9.6 oz (86.9 kg)    Relevant data reviewed during this visit included CBC

## 2023-10-23 DIAGNOSIS — Z23 Encounter for immunization: Secondary | ICD-10-CM | POA: Diagnosis not present

## 2023-11-20 DIAGNOSIS — I10 Essential (primary) hypertension: Secondary | ICD-10-CM | POA: Diagnosis not present

## 2023-11-20 DIAGNOSIS — G459 Transient cerebral ischemic attack, unspecified: Secondary | ICD-10-CM | POA: Diagnosis not present

## 2023-11-20 DIAGNOSIS — N3941 Urge incontinence: Secondary | ICD-10-CM | POA: Diagnosis not present

## 2023-11-20 DIAGNOSIS — H5789 Other specified disorders of eye and adnexa: Secondary | ICD-10-CM | POA: Diagnosis not present

## 2023-11-20 DIAGNOSIS — E7800 Pure hypercholesterolemia, unspecified: Secondary | ICD-10-CM | POA: Diagnosis not present

## 2023-11-20 DIAGNOSIS — D751 Secondary polycythemia: Secondary | ICD-10-CM | POA: Diagnosis not present

## 2023-11-20 DIAGNOSIS — Z23 Encounter for immunization: Secondary | ICD-10-CM | POA: Diagnosis not present

## 2024-01-27 NOTE — Progress Notes (Unsigned)
 [facility] [address] [place] [address] Phone: [phone] Subjective:   Cory [name], am serving as a scribe for Dr. Arthea Claudene.  I'm seeing this patient by the request  of:  Pahwani, Rinka R, MD  CC: Left hip pain  YEP:Dlagzrupcz  12/25/2021 Patient did not respond extremely well to the injections.  Did have some improvement for some time and we did discuss repeating.  At this point though patient would like to consider the possibility of surgical intervention.  Patient has had some slow progression and would put moderate to severe arthritic changes.  Patient is a healthy 88 year old gentleman.  I believe the patient would do very well with a right hip replacement at this point.  Given some Celebrex .  Patient will only use it more as needed or due to patient's creatinine has been minorly elevated but GFR has been normal.  We discussed only using it in 3-day burst and to discontinue if it does start to give him more difficulty.  Follow-up with me again as needed.  Total time discussing different treatment options as well as medications 33 minutes     Updated 01/29/2024 Cory [name] is a 88 y.o. male coming in with complaint of L hip pain. L hip has been giving out. The R great toe has been bothersome. Feels it most with with toe extension. No other concerns.       Past Medical History:  Diagnosis Date   Arthritis    Difficult intubation 02/23/2014   Glidescope for anterior airway   Erythrocytosis    GERD (gastroesophageal reflux disease)    History of kidney stones    Hypertension 01/27/2012   Pre-diabetes    Prostate cancer (HCC)    Thalassemia trait    Past Surgical History:  Procedure Laterality Date   BACK SURGERY  2007   CHOLECYSTECTOMY N/A 08/05/2017   Procedure: LAPAROSCOPIC CHOLECYSTECTOMY WITH INTRAOPERATIVE CHOLANGIOGRAM;  Surgeon: Belinda Cough, MD;  Location: [facility];  Service: General;  Laterality: N/A;   COLONOSCOPY      KNEE ARTHROSCOPY     many yrs ago   LYMPHADENECTOMY Bilateral 02/23/2014   Procedure: PELVIC LYMPH NODE DISSECTION;  Surgeon: Ricardo Likens, MD;  Location: [facility];  Service: Urology;  Laterality: Bilateral;   ROBOT ASSISTED LAPAROSCOPIC RADICAL PROSTATECTOMY N/A 02/23/2014   Procedure: ROBOTIC ASSISTED LAPAROSCOPIC RADICAL PROSTATECTOMY WITH INDOCYANINE GREEN DYE INJECTION;  Surgeon: Ricardo Likens, MD;  Location: [facility];  Service: Urology;  Laterality: N/A;   TOTAL HIP ARTHROPLASTY Right 03/12/2022   Procedure: RIGHT TOTAL HIP ARTHROPLASTY ANTERIOR APPROACH;  Surgeon: Sheril Coy, MD;  Location: [facility];  Service: Orthopedics;  Laterality: Right;   Social History   Socioeconomic History   Marital status: Married    Spouse name: Not on file   Number of children: Not on file   Years of education: Not on file   Highest education level: Not on file  Occupational History   Not on file  Tobacco Use   Smoking status: Former    Current packs/day: 0.00    Types: Cigarettes    Quit date: 02/18/1979    Years since quitting: 44.9   Smokeless tobacco: Never  Vaping Use   Vaping status: Never Used  Substance and Sexual Activity   Alcohol use: Yes    Comment: rare   Drug use: No   Sexual activity: Not Currently  Other Topics Concern   Not on file  Social History Narrative  Not on file   Social Drivers of Health   Financial Resource Strain: Not on file  Food Insecurity: No Food Insecurity (03/12/2022)   Hunger Vital Sign    Worried About Running Out of Food in the Last Year: Never true    Ran Out of Food in the Last Year: Never true  Transportation Needs: No Transportation Needs (03/12/2022)   PRAPARE - Administrator, Civil Service (Medical): No    Lack of Transportation (Non-Medical): No  Physical Activity: Not on file  Stress: Not on file  Social Connections: Not on file   No Known Allergies Family History  Problem Relation Age of Onset   Heart attack Mother  29     Current Outpatient Medications (Cardiovascular):    amLODipine -benazepril  (LOTREL) 5-40 MG capsule, Take 1 capsule by mouth daily.   atorvastatin  (LIPITOR) 40 MG tablet, Take 40 mg by mouth in the morning.   Current Outpatient Medications (Analgesics):    aspirin  EC 81 MG tablet, Take 1 tablet (81 mg total) by mouth 2 (two) times daily after a meal. For 2 weeks then once a day for 2 weeks for DVTprevention.   Current Outpatient Medications (Other):    Carboxymethylcellulose Sod PF 0.5 % SOLN, INSTILL 1 DROP IN EACH EYE 2-4 TIMES A DAY FOR DRY EYE   oxybutynin  (DITROPAN -XL) 10 MG 24 hr tablet, Take 10 mg by mouth in the morning.   Reviewed prior external information including notes and imaging from  primary care provider As well as notes that were available from care everywhere and other healthcare systems.  Past medical history, social, surgical and family history all reviewed in electronic medical record.  No pertanent information unless stated regarding to the chief complaint.   Review of Systems:  No headache, visual changes, nausea, vomiting, diarrhea, constipation, dizziness, abdominal pain, skin rash, fevers, chills, night sweats, weight loss, swollen lymph nodes, body aches, joint swelling, chest pain, shortness of breath, mood changes. POSITIVE muscle aches  Objective  Blood pressure (!) 130/90, pulse 80, height 5' 9 (1.753 m), weight 186 lb (84.4 kg), SpO2 96%.   General: No apparent distress alert and oriented x3 mood and affect normal, dressed appropriately.  HEENT: Pupils equal, extraocular movements intact  Respiratory: Patient's speak in full sentences and does not appear short of breath  Cardiovascular: No lower extremity edema, non tender, no erythema  To the left hip tender to palpation noted.  Severe limited internal range of motion noted.  Tenderness is to be mostly over the greater trochanteric area.  After verbal consent patient was prepped in sterile  fashion with alcohol swabs . Ethyl chloride used patient was injected with a 22-gauge 3 inch needle into the left lateral hip in the greater trochanteric area under ultrasound guidance. Picture was taken. Patient had 4 cc of 0.5% Marcaine  and 1 cc of Kenalog  40 mg/dL injected. Patient tolerated the procedure well and no blood loss. Pain completely resolved after injection stating proper placement. Post injection instructions given.    Impression and Recommendations:    The above documentation has been reviewed and is accurate and complete Alissah Redmon M Chin Wachter, DO

## 2024-01-29 ENCOUNTER — Ambulatory Visit (INDEPENDENT_AMBULATORY_CARE_PROVIDER_SITE_OTHER)

## 2024-01-29 ENCOUNTER — Encounter: Payer: Self-pay | Admitting: Family Medicine

## 2024-01-29 ENCOUNTER — Ambulatory Visit: Admitting: Family Medicine

## 2024-01-29 VITALS — BP 130/90 | HR 80 | Ht 69.0 in | Wt 186.0 lb

## 2024-01-29 DIAGNOSIS — M25552 Pain in left hip: Secondary | ICD-10-CM | POA: Diagnosis not present

## 2024-01-29 DIAGNOSIS — Z96641 Presence of right artificial hip joint: Secondary | ICD-10-CM | POA: Diagnosis not present

## 2024-01-29 DIAGNOSIS — M7062 Trochanteric bursitis, left hip: Secondary | ICD-10-CM | POA: Diagnosis not present

## 2024-01-29 DIAGNOSIS — M1612 Unilateral primary osteoarthritis, left hip: Secondary | ICD-10-CM

## 2024-01-29 NOTE — Assessment & Plan Note (Signed)
 Patient given injection today, had similar presentation on the contralateral side.  Does have some limited range of motion noted as well with the left hip.  Has not had arthroplasty of the last hip.  X-rays do show some moderate arthritic changes noted but nothing that would make me feel like patient would need replacement in the near future.  Discussed with patient about icing regimen and home exercises, discussed which activities to do and which ones to avoid.  Increase activity slowly.  Discussed icing regimen.  Follow-up again in 6 to 8 weeks

## 2024-01-29 NOTE — Patient Instructions (Addendum)
 Injection in hip Good to see you! Rain check for Reunion Don't lace last eye of shoe Oofos in house Ridged sole shoe for next 2 weeks See you again in 2 months

## 2024-02-07 ENCOUNTER — Ambulatory Visit: Payer: Self-pay | Admitting: Family Medicine

## 2024-02-19 ENCOUNTER — Telehealth: Payer: Self-pay

## 2024-02-19 NOTE — Telephone Encounter (Signed)
 S/w patient about changes to his upcoming appointments. Duplicate appointments cancelled at request of provider. Patient updated of the changes.  Patient reported that he had conflicting appointments and would need to reschedule upcoming lab and phlebotomy appointment. Call transferred to scheduling to assist with updating appointments.

## 2024-02-20 ENCOUNTER — Inpatient Hospital Stay

## 2024-02-24 ENCOUNTER — Other Ambulatory Visit

## 2024-02-24 ENCOUNTER — Inpatient Hospital Stay

## 2024-03-03 DIAGNOSIS — N393 Stress incontinence (female) (male): Secondary | ICD-10-CM | POA: Diagnosis not present

## 2024-03-03 DIAGNOSIS — N3941 Urge incontinence: Secondary | ICD-10-CM | POA: Diagnosis not present

## 2024-03-03 DIAGNOSIS — R350 Frequency of micturition: Secondary | ICD-10-CM | POA: Diagnosis not present

## 2024-03-05 ENCOUNTER — Ambulatory Visit: Admitting: Family Medicine

## 2024-03-05 ENCOUNTER — Inpatient Hospital Stay

## 2024-03-05 ENCOUNTER — Inpatient Hospital Stay: Attending: Hematology and Oncology

## 2024-03-05 DIAGNOSIS — D751 Secondary polycythemia: Secondary | ICD-10-CM | POA: Diagnosis not present

## 2024-03-05 DIAGNOSIS — G4730 Sleep apnea, unspecified: Secondary | ICD-10-CM | POA: Diagnosis not present

## 2024-03-05 LAB — CBC WITH DIFFERENTIAL/PLATELET
Abs Immature Granulocytes: 0.01 K/uL (ref 0.00–0.07)
Basophils Absolute: 0 K/uL (ref 0.0–0.1)
Basophils Relative: 1 %
Eosinophils Absolute: 0 K/uL (ref 0.0–0.5)
Eosinophils Relative: 1 %
HCT: 53.2 % — ABNORMAL HIGH (ref 39.0–52.0)
Hemoglobin: 16.8 g/dL (ref 13.0–17.0)
Immature Granulocytes: 0 %
Lymphocytes Relative: 38 %
Lymphs Abs: 1.5 K/uL (ref 0.7–4.0)
MCH: 24.4 pg — ABNORMAL LOW (ref 26.0–34.0)
MCHC: 31.6 g/dL (ref 30.0–36.0)
MCV: 77.2 fL — ABNORMAL LOW (ref 80.0–100.0)
Monocytes Absolute: 0.5 K/uL (ref 0.1–1.0)
Monocytes Relative: 12 %
Neutro Abs: 1.9 K/uL (ref 1.7–7.7)
Neutrophils Relative %: 48 %
Platelets: 156 K/uL (ref 150–400)
RBC: 6.89 MIL/uL — ABNORMAL HIGH (ref 4.22–5.81)
RDW: 17.7 % — ABNORMAL HIGH (ref 11.5–15.5)
WBC: 4 K/uL (ref 4.0–10.5)
nRBC: 0 % (ref 0.0–0.2)

## 2024-03-05 NOTE — Patient Instructions (Signed)

## 2024-03-05 NOTE — Progress Notes (Signed)
 Cory [name] presents today for phlebotomy per MD orders. Phlebotomy procedure started at 0908 and ended at 0920. 300 grams removed. Patient declined post phlebotomy observation period. Patient tolerated procedure well. IV needle removed intact. VSS.

## 2024-03-31 ENCOUNTER — Ambulatory Visit: Admitting: Family Medicine

## 2024-04-11 DIAGNOSIS — Z23 Encounter for immunization: Secondary | ICD-10-CM | POA: Diagnosis not present

## 2024-05-20 DIAGNOSIS — E7800 Pure hypercholesterolemia, unspecified: Secondary | ICD-10-CM | POA: Diagnosis not present

## 2024-05-20 DIAGNOSIS — G4733 Obstructive sleep apnea (adult) (pediatric): Secondary | ICD-10-CM | POA: Diagnosis not present

## 2024-05-20 DIAGNOSIS — G459 Transient cerebral ischemic attack, unspecified: Secondary | ICD-10-CM | POA: Diagnosis not present

## 2024-05-20 DIAGNOSIS — Z1331 Encounter for screening for depression: Secondary | ICD-10-CM | POA: Diagnosis not present

## 2024-05-20 DIAGNOSIS — D751 Secondary polycythemia: Secondary | ICD-10-CM | POA: Diagnosis not present

## 2024-05-20 DIAGNOSIS — I10 Essential (primary) hypertension: Secondary | ICD-10-CM | POA: Diagnosis not present

## 2024-05-20 DIAGNOSIS — R7303 Prediabetes: Secondary | ICD-10-CM | POA: Diagnosis not present

## 2024-05-20 DIAGNOSIS — Z0000 Encounter for general adult medical examination without abnormal findings: Secondary | ICD-10-CM | POA: Diagnosis not present

## 2024-05-20 DIAGNOSIS — N3941 Urge incontinence: Secondary | ICD-10-CM | POA: Diagnosis not present

## 2024-05-20 DIAGNOSIS — K219 Gastro-esophageal reflux disease without esophagitis: Secondary | ICD-10-CM | POA: Diagnosis not present

## 2024-05-20 DIAGNOSIS — N5201 Erectile dysfunction due to arterial insufficiency: Secondary | ICD-10-CM | POA: Diagnosis not present

## 2024-05-20 DIAGNOSIS — M1611 Unilateral primary osteoarthritis, right hip: Secondary | ICD-10-CM | POA: Diagnosis not present

## 2024-06-15 ENCOUNTER — Inpatient Hospital Stay: Attending: Hematology and Oncology

## 2024-06-15 ENCOUNTER — Inpatient Hospital Stay

## 2024-06-15 DIAGNOSIS — D751 Secondary polycythemia: Secondary | ICD-10-CM

## 2024-06-15 LAB — CBC WITH DIFFERENTIAL/PLATELET
Abs Immature Granulocytes: 0.02 K/uL (ref 0.00–0.07)
Basophils Absolute: 0 K/uL (ref 0.0–0.1)
Basophils Relative: 1 %
Eosinophils Absolute: 0.1 K/uL (ref 0.0–0.5)
Eosinophils Relative: 1 %
HCT: 52.4 % — ABNORMAL HIGH (ref 39.0–52.0)
Hemoglobin: 17.1 g/dL — ABNORMAL HIGH (ref 13.0–17.0)
Immature Granulocytes: 1 %
Lymphocytes Relative: 43 %
Lymphs Abs: 1.8 K/uL (ref 0.7–4.0)
MCH: 25.4 pg — ABNORMAL LOW (ref 26.0–34.0)
MCHC: 32.6 g/dL (ref 30.0–36.0)
MCV: 78 fL — ABNORMAL LOW (ref 80.0–100.0)
Monocytes Absolute: 0.5 K/uL (ref 0.1–1.0)
Monocytes Relative: 11 %
Neutro Abs: 1.8 K/uL (ref 1.7–7.7)
Neutrophils Relative %: 43 %
Platelets: 156 K/uL (ref 150–400)
RBC: 6.72 MIL/uL — ABNORMAL HIGH (ref 4.22–5.81)
RDW: 18.2 % — ABNORMAL HIGH (ref 11.5–15.5)
WBC: 4.2 K/uL (ref 4.0–10.5)
nRBC: 0 % (ref 0.0–0.2)

## 2024-06-15 NOTE — Progress Notes (Signed)
 Cory [name] presents today for phlebotomy per MD orders. Phlebotomy procedure started at 0857 and ended at 0903. 514 grams removed. Patient declined 30 minute observation  Patient tolerated procedure well. 16g IV needle kit removed intact from L AC .

## 2024-06-15 NOTE — Patient Instructions (Signed)

## 2024-06-21 ENCOUNTER — Inpatient Hospital Stay

## 2024-10-14 ENCOUNTER — Inpatient Hospital Stay

## 2024-10-14 ENCOUNTER — Inpatient Hospital Stay: Admitting: Hematology and Oncology
